# Patient Record
Sex: Female | Born: 1965 | Race: White | Hispanic: No | Marital: Married | Smoking: Never smoker
Health system: Southern US, Community
[De-identification: ages and names within clinical notes are randomized; demographics above are authoritative.]

## PROBLEM LIST (undated history)

## (undated) DIAGNOSIS — C801 Malignant (primary) neoplasm, unspecified: Secondary | ICD-10-CM

## (undated) DIAGNOSIS — R011 Cardiac murmur, unspecified: Secondary | ICD-10-CM

## (undated) DIAGNOSIS — T783XXA Angioneurotic edema, initial encounter: Secondary | ICD-10-CM

## (undated) DIAGNOSIS — L509 Urticaria, unspecified: Secondary | ICD-10-CM

## (undated) DIAGNOSIS — Z8489 Family history of other specified conditions: Secondary | ICD-10-CM

## (undated) DIAGNOSIS — R7303 Prediabetes: Secondary | ICD-10-CM

## (undated) HISTORY — DX: Angioneurotic edema, initial encounter: T78.3XXA

## (undated) HISTORY — DX: Urticaria, unspecified: L50.9

## (undated) HISTORY — PX: TONSILLECTOMY: SUR1361

---

## 2012-10-19 ENCOUNTER — Emergency Department (HOSPITAL_COMMUNITY): Payer: BC Managed Care – PPO

## 2012-10-19 ENCOUNTER — Inpatient Hospital Stay (HOSPITAL_COMMUNITY)
Admission: EM | Admit: 2012-10-19 | Discharge: 2012-10-22 | DRG: 421 | Disposition: A | Discharge status: Home / Self Care | Payer: BC Managed Care – PPO | Attending: Family Medicine | Admitting: Family Medicine

## 2012-10-19 ENCOUNTER — Encounter (HOSPITAL_COMMUNITY): Payer: Self-pay | Admitting: Emergency Medicine

## 2012-10-19 DIAGNOSIS — A938 Other specified arthropod-borne viral fevers: Principal | ICD-10-CM | POA: Diagnosis present

## 2012-10-19 DIAGNOSIS — Z8249 Family history of ischemic heart disease and other diseases of the circulatory system: Secondary | ICD-10-CM

## 2012-10-19 DIAGNOSIS — E860 Dehydration: Secondary | ICD-10-CM | POA: Diagnosis present

## 2012-10-19 DIAGNOSIS — E876 Hypokalemia: Secondary | ICD-10-CM | POA: Diagnosis present

## 2012-10-19 DIAGNOSIS — D72819 Decreased white blood cell count, unspecified: Secondary | ICD-10-CM | POA: Diagnosis present

## 2012-10-19 DIAGNOSIS — R7402 Elevation of levels of lactic acid dehydrogenase (LDH): Secondary | ICD-10-CM | POA: Diagnosis present

## 2012-10-19 DIAGNOSIS — D61818 Other pancytopenia: Secondary | ICD-10-CM | POA: Diagnosis present

## 2012-10-19 DIAGNOSIS — R000 Tachycardia, unspecified: Secondary | ICD-10-CM | POA: Diagnosis present

## 2012-10-19 DIAGNOSIS — K219 Gastro-esophageal reflux disease without esophagitis: Secondary | ICD-10-CM | POA: Diagnosis present

## 2012-10-19 DIAGNOSIS — R7401 Elevation of levels of liver transaminase levels: Secondary | ICD-10-CM | POA: Diagnosis present

## 2012-10-19 DIAGNOSIS — D696 Thrombocytopenia, unspecified: Secondary | ICD-10-CM

## 2012-10-19 DIAGNOSIS — I959 Hypotension, unspecified: Secondary | ICD-10-CM | POA: Diagnosis present

## 2012-10-19 DIAGNOSIS — B882 Other arthropod infestations: Secondary | ICD-10-CM

## 2012-10-19 DIAGNOSIS — A7740 Ehrlichiosis, unspecified: Secondary | ICD-10-CM | POA: Diagnosis present

## 2012-10-19 LAB — CBC WITH DIFFERENTIAL/PLATELET
Eosinophils Relative: 0 % (ref 0–5)
HCT: 36.2 % (ref 36.0–46.0)
Hemoglobin: 12.5 g/dL (ref 12.0–15.0)
Lymphocytes Relative: 19 % (ref 12–46)
Lymphs Abs: 0.3 10*3/uL — ABNORMAL LOW (ref 0.7–4.0)
MCV: 88.5 fL (ref 78.0–100.0)
Monocytes Relative: 6 % (ref 3–12)
Neutro Abs: 1.3 10*3/uL — ABNORMAL LOW (ref 1.7–7.7)
RBC: 4.09 MIL/uL (ref 3.87–5.11)
WBC: 1.7 10*3/uL — ABNORMAL LOW (ref 4.0–10.5)

## 2012-10-19 LAB — URINALYSIS, ROUTINE W REFLEX MICROSCOPIC
Hgb urine dipstick: NEGATIVE
Ketones, ur: 15 mg/dL — AB
Nitrite: NEGATIVE
Protein, ur: 100 mg/dL — AB
Specific Gravity, Urine: >1.03 — ABNORMAL HIGH (ref 1.005–1.030)
Urobilinogen, UA: 1 mg/dL (ref 0.0–1.0)

## 2012-10-19 LAB — COMPREHENSIVE METABOLIC PANEL
ALT: 95 U/L — ABNORMAL HIGH (ref 0–35)
AST: 78 U/L — ABNORMAL HIGH (ref 0–37)
Alkaline Phosphatase: 66 U/L (ref 39–117)
CO2: 30 mEq/L (ref 19–32)
Calcium: 8.9 mg/dL (ref 8.4–10.5)
GFR calc Af Amer: >90 mL/min (ref >90)
GFR calc non Af Amer: >90 mL/min (ref >90)
Glucose, Bld: 111 mg/dL — ABNORMAL HIGH (ref 70–99)
Potassium: 3.1 mEq/L — ABNORMAL LOW (ref 3.5–5.1)
Sodium: 139 mEq/L (ref 135–145)
Total Protein: 6.9 g/dL (ref 6.0–8.3)

## 2012-10-19 LAB — URINE MICROSCOPIC-ADD ON

## 2012-10-19 MED ORDER — ACETAMINOPHEN 325 MG PO TABS
650.0000 mg | ORAL_TABLET | Freq: Four times a day (QID) | ORAL | Status: DC | PRN
  Administered 2012-10-19 – 2012-10-21 (×6): 650 mg via ORAL
  Filled 2012-10-19 (×6): qty 2

## 2012-10-19 MED ORDER — DOXYCYCLINE HYCLATE 100 MG PO TABS
100.0000 mg | ORAL_TABLET | Freq: Two times a day (BID) | ORAL | Status: DC
  Administered 2012-10-20 – 2012-10-22 (×5): 100 mg via ORAL
  Filled 2012-10-19 (×5): qty 1

## 2012-10-19 MED ORDER — SODIUM CHLORIDE 0.9 % IV BOLUS (SEPSIS)
1000.0000 mL | Freq: Once | INTRAVENOUS | Status: AC
  Administered 2012-10-19: 1000 mL via INTRAVENOUS

## 2012-10-19 MED ORDER — POTASSIUM CHLORIDE CRYS ER 20 MEQ PO TBCR
40.0000 meq | EXTENDED_RELEASE_TABLET | ORAL | Status: AC
  Administered 2012-10-19 (×2): 40 meq via ORAL
  Filled 2012-10-19 (×2): qty 2

## 2012-10-19 MED ORDER — DOXYCYCLINE HYCLATE 100 MG PO TABS: 100.0000 mg | ORAL_TABLET | Freq: Two times a day (BID) | ORAL | Status: DC

## 2012-10-19 MED ORDER — HYDROCODONE-ACETAMINOPHEN 5-325 MG PO TABS
1.0000 | ORAL_TABLET | ORAL | Status: DC | PRN
  Administered 2012-10-20: 1 via ORAL
  Filled 2012-10-19: qty 1

## 2012-10-19 MED ORDER — ONDANSETRON HCL 4 MG/2ML IJ SOLN: 4.0000 mg | Freq: Three times a day (TID) | INTRAMUSCULAR | Status: DC | PRN

## 2012-10-19 MED ORDER — ONDANSETRON HCL 4 MG/2ML IJ SOLN
4.0000 mg | Freq: Once | INTRAMUSCULAR | Status: AC
  Administered 2012-10-19: 4 mg via INTRAVENOUS
  Filled 2012-10-19: qty 2

## 2012-10-19 MED ORDER — ACETAMINOPHEN 650 MG RE SUPP: 650.0000 mg | Freq: Four times a day (QID) | RECTAL | Status: DC | PRN

## 2012-10-19 MED ORDER — ONDANSETRON HCL 4 MG PO TABS
4.0000 mg | ORAL_TABLET | Freq: Four times a day (QID) | ORAL | Status: DC | PRN
  Administered 2012-10-19: 4 mg via ORAL
  Filled 2012-10-19: qty 1

## 2012-10-19 MED ORDER — HYDROMORPHONE HCL PF 1 MG/ML IJ SOLN: 0.5000 mg | INTRAMUSCULAR | Status: AC | PRN

## 2012-10-19 MED ORDER — SODIUM CHLORIDE 0.9 % IV SOLN
INTRAVENOUS | Status: DC
  Administered 2012-10-19: 13:00:00 via INTRAVENOUS

## 2012-10-19 MED ORDER — ONDANSETRON HCL 4 MG/2ML IJ SOLN
4.0000 mg | Freq: Four times a day (QID) | INTRAMUSCULAR | Status: DC | PRN
  Administered 2012-10-21: 4 mg via INTRAVENOUS
  Filled 2012-10-19: qty 2

## 2012-10-19 MED ORDER — DOXYCYCLINE HYCLATE 100 MG PO TABS
100.0000 mg | ORAL_TABLET | Freq: Once | ORAL | Status: AC
  Administered 2012-10-19: 100 mg via ORAL
  Filled 2012-10-19: qty 1

## 2012-10-19 MED ORDER — SODIUM CHLORIDE 0.9 % IV SOLN
INTRAVENOUS | Status: AC
  Administered 2012-10-19: 16:00:00 via INTRAVENOUS

## 2012-10-19 MED ORDER — ALUM & MAG HYDROXIDE-SIMETH 200-200-20 MG/5ML PO SUSP
30.0000 mL | Freq: Four times a day (QID) | ORAL | Status: DC | PRN
  Administered 2012-10-20 – 2012-10-21 (×2): 30 mL via ORAL
  Filled 2012-10-19 (×2): qty 30

## 2012-10-19 MED ORDER — HYDROMORPHONE HCL PF 1 MG/ML IJ SOLN: 1.0000 mg | INTRAMUSCULAR | Status: DC | PRN

## 2012-10-19 NOTE — H&P (Signed)
Patient seen, independently examined and chart reviewed. I agree with exam, assessment and plan discussed with Toya Smothers, NP.  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 Approximately 3-4 weeks ago she noticed a tick bite between her shoulder blades. She never developed a rash. Shortly thereafter she developed nausea without vomiting. She ended up going to the beach on vacation and developed some urinary incontinence, was treated with a sulfa antibiotic with some improvement. However she has continued to have vague nausea and fatigue. Her appetite has been poor. She has had fever constantly for the last several days which she has been treating with antipyretics and shaking chills intermittently at home. She continued to work. Earlier this week she was started on Augmentin and given a shot of Rocephin for possible recurrent bladder infection. Today she felt poorly, her blood pressure is checked at work and she was found to be hypotensive. She had a severe headache probably 3 weeks ago which resolved. She did have a mild headache earlier today.  She has some shaking chills but appears nontoxic. Skin exam is unremarkable. Cardiovascular regular rate and rhythm. Respiratory clear to auscultation bilaterally. Speech fluent and clear.  Her evaluation in the emergency department is highly suggestive of tick borne illness (leukopenia, thrombocytopenia, elevated serum transaminases). Given the lack of rash, suspect Ehrlichiosis, although cannot exclude [facility] Spotted Fever. Regardless treatment is doxycycline.   [name], MD [facility] [phone]

## 2012-10-19 NOTE — ED Notes (Signed)
Pt ambulated to BR without difficultly but pt still feels weak, pt back to bed

## 2012-10-19 NOTE — ED Provider Notes (Signed)
History  This chart was scribed for Shelda Jakes, MD by Bennett Scrape, ED Scribe. This patient was seen in room APA07/APA07 and the patient's care was started at 10:18 AM.  CSN: [id number]  Arrival date & time 10/19/12  4098   First MD Initiated Contact with Patient 10/19/12 1018     Chief Complaint  Patient presents with  . Nausea  . Fever  . Fatigue    Patient is a 47 y.o. female presenting with fever. The history is provided by the patient. No language interpreter was used.  Fever Max temp prior to arrival:  103 Severity:  Moderate Duration:  2 days Timing:  Intermittent Progression:  Unchanged Chronicity:  New Relieved by:  Acetaminophen and ibuprofen Worsened by:  Nothing tried Associated symptoms: headaches (with fever), myalgias (with fever) and nausea   Associated symptoms: no chest pain, no chills, no confusion, no congestion, no cough, no diarrhea, no dysuria, no rash, no sore throat and no vomiting     HPI Comments: Holly Allison is a 47 y.o. female who presents to the Emergency Department complaining of 2 days of nausea with associated fever of 103 and lightheadedness. The nausea is worse with standing and improved with rest. She reports that she has had a negative work up at her PCP's 2 days ago and has been treated with Augmentin with no improvement. The fever has been controlled with OTC medications since onset. She reports one episode of neat syncope this morning with a BP of 78/56 during the episode. She reports one episode of similar symptoms 2 weeks ago that improved with "sulfa antibiotics". She admits to having a tick bite one month ago but none within the past 2 weeks. She also reports myalgias and HAs as associated symptoms with the fever but denies emesis, diarrhea and abdominal pain. Pt does not have a h/o chronic medical conditions and denies smoking and alcohol use.  PCP is Dr. Neita Carp in [place].  History reviewed. No pertinent past medical  history.  Past Surgical History  Procedure Laterality Date  . Cesarean section    . Tonsillectomy     No family history on file. History  Substance Use Topics  . Smoking status: Never Smoker   . Smokeless tobacco: Not on file  . Alcohol Use: No   No OB history provided.  Review of Systems  Constitutional: Positive for fever. Negative for chills.  HENT: Negative for congestion, sore throat and neck pain.   Eyes: Negative for visual disturbance.  Respiratory: Negative for cough and shortness of breath.   Cardiovascular: Negative for chest pain and leg swelling.  Gastrointestinal: Positive for nausea. Negative for vomiting, abdominal pain and diarrhea.  Genitourinary: Negative for dysuria.  Musculoskeletal: Positive for myalgias (with fever). Negative for back pain.  Skin: Negative for rash.  Neurological: Positive for light-headedness and headaches (with fever).  Hematological: Does not bruise/bleed easily.  Psychiatric/Behavioral: Negative for confusion.    Allergies  Review of patient's allergies indicates no known allergies.  Home Medications   Current Outpatient Rx  Name  Route  Sig  Dispense  Refill  . acetaminophen (TYLENOL) 500 MG tablet   Oral   Take 500 mg by mouth every 6 (six) hours as needed for pain.         Marland Kitchen amoxicillin-clavulanate (AUGMENTIN) 875-125 MG per tablet   Oral   Take 1 tablet by mouth 2 (two) times daily. Started on Tuesday 10/17/2012         .  ibuprofen (ADVIL,MOTRIN) 200 MG tablet   Oral   Take 400 mg by mouth every 6 (six) hours as needed for pain.          Triage Vitals: BP 127/77  Pulse 102  Temp(Src) 98.9 F (37.2 C)  Resp 18  Ht 5\' 4"  (1.626 m)  Wt 152 lb (68.947 kg)  BMI 26.08 kg/m2  SpO2 97%  LMP 09/10/2012  Physical Exam  Nursing note and vitals reviewed. Constitutional: She is oriented to person, place, and time. She appears well-developed and well-nourished. No distress.  HENT:  Head: Normocephalic and  atraumatic.  Mouth/Throat: Oropharynx is clear and moist.  Eyes: Conjunctivae and EOM are normal. Pupils are equal, round, and reactive to light.  Sclera are clear  Neck: Neck supple. No tracheal deviation present.  Cardiovascular: Normal rate and regular rhythm.   No murmur heard. Pulses:      Dorsalis pedis pulses are 2+ on the right side, and 2+ on the left side.  Pulmonary/Chest: Effort normal and breath sounds normal. No respiratory distress. She has no wheezes.  Abdominal: Soft. Bowel sounds are normal. She exhibits no distension. There is no tenderness.  Musculoskeletal: Normal range of motion. She exhibits no edema (no ankle swelling).  Lymphadenopathy:    She has no cervical adenopathy.  Neurological: She is alert and oriented to person, place, and time. No cranial nerve deficit.  Pt able to move both sets of fingers and toes  Skin: Skin is warm and dry. No rash noted.  Psychiatric: She has a normal mood and affect. Her behavior is normal.    ED Course  Procedures (including critical care time)  DIAGNOSTIC STUDIES: Oxygen Saturation is 97% on room air, normal by my interpretation.    COORDINATION OF CARE: 11:04 AM-Discussed treatment plan which includes repeat BMP and CBC with pt at bedside and pt agreed to plan.   Labs Reviewed  URINALYSIS, ROUTINE W REFLEX MICROSCOPIC - Abnormal; Notable for the following:    Color, Urine AMBER (*)    Specific Gravity, Urine >1.030 (*)    Bilirubin Urine MODERATE (*)    Ketones, ur 15 (*)    Protein, ur 100 (*)    All other components within normal limits  URINE MICROSCOPIC-ADD ON - Abnormal; Notable for the following:    Squamous Epithelial / LPF MANY (*)    Bacteria, UA MANY (*)    All other components within normal limits  CBC WITH DIFFERENTIAL - Abnormal; Notable for the following:    WBC 1.7 (*)    Platelets 103 (*)    Neutro Abs 1.3 (*)    Lymphs Abs 0.3 (*)    All other components within normal limits  COMPREHENSIVE  METABOLIC PANEL - Abnormal; Notable for the following:    Potassium 3.1 (*)    Glucose, Bld 111 (*)    AST 78 (*)    ALT 95 (*)    All other components within normal limits  CULTURE, BLOOD (ROUTINE X 2)  CULTURE, BLOOD (ROUTINE X 2)  PREGNANCY, URINE  LIPASE, BLOOD  LACTIC ACID, PLASMA   Results for orders placed during the hospital encounter of 10/19/12  URINALYSIS, ROUTINE W REFLEX MICROSCOPIC      Result Value Range   Color, Urine AMBER (*) YELLOW   APPearance CLEAR  CLEAR   Specific Gravity, Urine >1.030 (*) 1.005 - 1.030   pH 5.5  5.0 - 8.0   Glucose, UA NEGATIVE  NEGATIVE mg/dL   Hgb urine  dipstick NEGATIVE  NEGATIVE   Bilirubin Urine MODERATE (*) NEGATIVE   Ketones, ur 15 (*) NEGATIVE mg/dL   Protein, ur 657 (*) NEGATIVE mg/dL   Urobilinogen, UA 1.0  0.0 - 1.0 mg/dL   Nitrite NEGATIVE  NEGATIVE   Leukocytes, UA NEGATIVE  NEGATIVE  PREGNANCY, URINE      Result Value Range   Preg Test, Ur NEGATIVE  NEGATIVE  URINE MICROSCOPIC-ADD ON      Result Value Range   Squamous Epithelial / LPF MANY (*) RARE   WBC, UA 0-2  <3 WBC/hpf   RBC / HPF 0-2  <3 RBC/hpf   Bacteria, UA MANY (*) RARE  CBC WITH DIFFERENTIAL      Result Value Range   WBC 1.7 (*) 4.0 - 10.5 K/uL   RBC 4.09  3.87 - 5.11 MIL/uL   Hemoglobin 12.5  12.0 - 15.0 g/dL   HCT 84.6  96.2 - 95.2 %   MCV 88.5  78.0 - 100.0 fL   MCH 30.6  26.0 - 34.0 pg   MCHC 34.5  30.0 - 36.0 g/dL   RDW 84.1  32.4 - 40.1 %   Platelets 103 (*) 150 - 400 K/uL   Neutrophils Relative % 74  43 - 77 %   Lymphocytes Relative 19  12 - 46 %   Monocytes Relative 6  3 - 12 %   Eosinophils Relative 0  0 - 5 %   Basophils Relative 1  0 - 1 %   Neutro Abs 1.3 (*) 1.7 - 7.7 K/uL   Lymphs Abs 0.3 (*) 0.7 - 4.0 K/uL   Monocytes Absolute 0.1  0.1 - 1.0 K/uL   Eosinophils Absolute 0.0  0.0 - 0.7 K/uL   Basophils Absolute 0.0  0.0 - 0.1 K/uL   RBC Morphology POLYCHROMASIA PRESENT     WBC Morphology WHITE COUNT CONFIRMED ON SMEAR     Smear  Review PLATELET COUNT CONFIRMED BY SMEAR    COMPREHENSIVE METABOLIC PANEL      Result Value Range   Sodium 139  135 - 145 mEq/L   Potassium 3.1 (*) 3.5 - 5.1 mEq/L   Chloride 102  96 - 112 mEq/L   CO2 30  19 - 32 mEq/L   Glucose, Bld 111 (*) 70 - 99 mg/dL   BUN 9  6 - 23 mg/dL   Creatinine, Ser 0.27  0.50 - 1.10 mg/dL   Calcium 8.9  8.4 - 25.3 mg/dL   Total Protein 6.9  6.0 - 8.3 g/dL   Albumin 3.5  3.5 - 5.2 g/dL   AST 78 (*) 0 - 37 U/L   ALT 95 (*) 0 - 35 U/L   Alkaline Phosphatase 66  39 - 117 U/L   Total Bilirubin 0.3  0.3 - 1.2 mg/dL   GFR calc non Af Amer >90  >90 mL/min   GFR calc Af Amer >90  >90 mL/min  LIPASE, BLOOD      Result Value Range   Lipase 32  11 - 59 U/L      Dg Chest 2 View  10/19/2012   *RADIOLOGY REPORT*  Clinical Data: Fatigue for 2 days.  Nausea and fever.  CHEST - 2 VIEW  Comparison: 08/20/2010.  Findings: No consolidation, edema, effusion, pneumothorax. Indistinct medial right hemidiaphragm appears similar to prior, likely related to a fat pad.  Normal heart size and mediastinal contours.  No acute osseous abnormality.  IMPRESSION: No evidence of active cardiopulmonary disease.  Original Report Authenticated By: Tiburcio Pea   US Abdomen Complete  10/19/2012   *RADIOLOGY REPORT*  Abdominal ultrasound  History:  Nausea and fever  Findings:  The gallbladder is visualized in multiple projections. There are no gallstones, gallbladder wall thickening, or pericholecystic fluid collection.  There is no intrahepatic, common hepatic, or common bile duct dilatation.  Pancreas appears normal.  No focal liver lesions are identified.  Flow in the portal vein is in the anatomic direction.  Spleen is upper limits normal in size with normal splenic echogenicity.  There is a prominent extrarenal pelvis on the right, an anatomic variant.  Kidneys bilaterally otherwise appear normal. There is no ascites.  Aorta is nonaneurysmal.  Inferior vena cava appears normal.   Conclusion:  Spleen upper normal in size.  No focal splenic lesions.  Prominent extrarenal pelvis right kidney, an anatomic variant.  Study otherwise unremarkable.   Original Report Authenticated By: Bretta Bang, M.D.    1. Leukopenia     MDM  Patient is ill-appearing. Based on CBC significant leukopenia and low platelets. Electrolytes without significant abnormalities but concern for possible tickborne illness. Patient did have a tick exposure about a month ago. Certainly no evidence of [facility] spotted fever at this point in time without a rash but she is febrile. Patient certainly has chills and is ill-appearing. Ultrasound shows slight enlargement of the spleen but nothing significant gallbladder was fine she had some mild right upper quadrant tenderness. Patient also has some liver function test abnormalities the bilirubin is not elevated. All this is suggestive of possible tickborne illness internal medicine raise to concern for regular doses which is not common in this area. But it is possibility of Lyme's disease is also a possibility. [facility] spotted fevers possibly if rash develops. We'll treat with doxycycline and arrange observation admission. Patient will benefit from continued hydration. A better on the blood cultures and lactic acid. Patient was concerned about urinary tract infection no evidence of that at all.  I personally performed the services described in this documentation, which was scribed in my presence. The recorded information has been reviewed and is accurate.   Shelda Jakes, MD 10/19/12 [phone]

## 2012-10-19 NOTE — H&P (Signed)
[facility] History and Physical  Maryana Pittmon YNW:[id number] DOB: 13-Apr-1965 DOA: 10/19/2012  Referring physician:  PCP: [name], MD  Specialists:   Chief Complaint: Fever and fatigue  HPI: Clarita Mcelvain is a 47 y.o. female no past medical history except gestational diabetes who presents to the emergency room today with the chief complaint of fever, fatigue and nausea. Information is obtained from the patient. She states that about 3 weeks ago she found a tick between her shoulder blades on her back. It was removed by her husband. States that shortly thereafter she went to the beach and began to feel generally "bad". She thought she had a urinary tract infection so she called her doctor who called some medication in for her at the beach. She states that she began to feel little bit better.She came home from the beach feeling somewhat better and return to work. Last week she gradually began to feel "bad all over again. She states that primary symptoms were intermittent fever with the max being 103, constant nausea no vomiting and worsening fatigue. She states that today she awakened feeling very weak and feverish but decided to get dressed and go to work. When she arrived at work she developed a headache and felt dizzy. Her coworkers took her blood pressure and at that time her blood pressure was 76/52. Her coworkers then called her mother who transported her to the emergency room. Patient denies any syncope. She denies any neck rigidity. Lab work in the emergency room is significant for a white count of 1.7 platelets of 103 potassium 3.1, AST 78 ALT 95. Abdominal ultrasound yields spleen upper normal in size. No focal splenic lesions. Prominent extrarenal pelvis right kidney, an anatomic variant. Chest x-ray yields no evidence of active cardiopulmonary disease. No signs are significant for a heart rate of 102. Sent received IV fluids and Zofran in the emergency room. Symptoms came on  gradually have persisted and worsened. Characterized as moderate. [facility] asked to admit.   Review of Systems: The patient denies  weight loss,, vision loss, decreased hearing, hoarseness, chest pain, syncope, dyspnea on exertion, peripheral edema, balance deficits, hemoptysis, abdominal pain, melena, hematochezia, severe indigestion/heartburn, hematuria, incontinence, genital sores,  suspicious skin lesions, transient blindness, difficulty walking, depression, unusual weight change, abnormal bleeding, enlarged lymph nodes, angioedema, and breast masses.    History reviewed. No pertinent past medical history. Past Surgical History  Procedure Laterality Date  . Cesarean section    . Tonsillectomy     Social History:  reports that she has never smoked. She does not have any smokeless tobacco history on file. She reports that she does not drink alcohol or use illicit drugs. 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 She is married lives with her husband and her children No Known Allergies  No family history on file. mother is alive at 34 years old. She has diabetes hypertension and high cholesterol. The patient has one brother who has hypertension. Her father's medical  history is unknown to her.  Prior to Admission medications   Medication Sig Start Date End Date Taking? Authorizing Provider  acetaminophen (TYLENOL) 500 MG tablet Take 500 mg by mouth every 6 (six) hours as needed for pain.   Yes Historical Provider, MD  amoxicillin-clavulanate (AUGMENTIN) 875-125 MG per tablet Take 1 tablet by mouth 2 (two) times daily. Started on Tuesday 10/17/2012   Yes Historical Provider, MD  ibuprofen (ADVIL,MOTRIN) 200 MG tablet Take 400 mg by mouth every  6 (six) hours as needed for pain.   Yes Historical Provider, MD   Physical Exam: Filed Vitals:   10/19/12 1000 10/19/12 1002 10/19/12 1004 10/19/12 1524  BP: 145/78 126/73 127/77 128/81  Pulse: 91 95 102 100  Temp:     98.6 F (37 C)  TempSrc:    Oral  Resp:    18  Height:      Weight:      SpO2:    99%     General:  Well-nourished somewhat tremulous somewhat ill appearing  Eyes: PE RRL, it EOMI, no scleral icterus  ENT: Ears clear nose without drainage oropharynx without erythema or exudate. Mucous membranes of her mouth are pink slightly dry  Neck: Supple full range of motion no rigidity no lymphadenopathy  Cardiovascular: Mildly tachycardic but regular no murmur gallop or rub no lower extremity edema pedal pulses present and palpable  Respiratory: Normal effort breath sounds clear bilaterally no wheeze no rhonchi  Abdomen: Soft nondistended positive bowel sounds nontender to palpation no mass organomegaly noted  Skin: Warm and dry no rash no lesions  Musculoskeletal: No clubbing no cyanosis joints without erythema or swelling. Joints nontender to touch  Psychiatric: Somewhat anxious and teary but cooperative  Neurologic: Cranial nerves II through XII grossly intact speech clear facial some  Labs on Admission:  Basic Metabolic Panel:  Recent Labs Lab 10/19/12 1143  NA 139  K 3.1*  CL 102  CO2 30  GLUCOSE 111*  BUN 9  CREATININE 0.69  CALCIUM 8.9   Liver Function Tests:  Recent Labs Lab 10/19/12 1143  AST 78*  ALT 95*  ALKPHOS 66  BILITOT 0.3  PROT 6.9  ALBUMIN 3.5    Recent Labs Lab 10/19/12 1143  LIPASE 32   No results found for this basename: AMMONIA,  in the last 168 hours CBC:  Recent Labs Lab 10/19/12 1143  WBC 1.7*  NEUTROABS 1.3*  HGB 12.5  HCT 36.2  MCV 88.5  PLT 103*   Cardiac Enzymes: No results found for this basename: CKTOTAL, CKMB, CKMBINDEX, TROPONINI,  in the last 168 hours  BNP (last 3 results) No results found for this basename: PROBNP,  in the last 8760 hours CBG: No results found for this basename: GLUCAP,  in the last 168 hours  Radiological Exams on Admission: Dg Chest 2 View  10/19/2012   *RADIOLOGY REPORT*  Clinical  Data: Fatigue for 2 days.  Nausea and fever.  CHEST - 2 VIEW  Comparison: 08/20/2010.  Findings: No consolidation, edema, effusion, pneumothorax. Indistinct medial right hemidiaphragm appears similar to prior, likely related to a fat pad.  Normal heart size and mediastinal contours.  No acute osseous abnormality.  IMPRESSION: No evidence of active cardiopulmonary disease.   Original Report Authenticated By: Tiburcio Pea   US Abdomen Complete  10/19/2012   *RADIOLOGY REPORT*  Abdominal ultrasound  History:  Nausea and fever  Findings:  The gallbladder is visualized in multiple projections. There are no gallstones, gallbladder wall thickening, or pericholecystic fluid collection.  There is no intrahepatic, common hepatic, or common bile duct dilatation.  Pancreas appears normal.  No focal liver lesions are identified.  Flow in the portal vein is in the anatomic direction.  Spleen is upper limits normal in size with normal splenic echogenicity.  There is a prominent extrarenal pelvis on the right, an anatomic variant.  Kidneys bilaterally otherwise appear normal. There is no ascites.  Aorta is nonaneurysmal.  Inferior vena cava appears normal.  Conclusion:  Spleen upper normal in size.  No focal splenic lesions.  Prominent extrarenal pelvis right kidney, an anatomic variant.  Study otherwise unremarkable.   Original Report Authenticated By: Bretta Bang, M.D.    EKG:   Assessment/Plan Active Problems:   Leukopenia: Etiology unclear. Concern for tickborne illness given reported history. Admit to medical floor for observation. Will start doxycycline 100 mg by mouth twice a day.    Thrombocytopenia: Etiology unclear but there is a concern for tickborne illness given reported history. No current signs and symptoms of bleeding. Will provide supportive therapy. Will use SCDs for DVT prophylaxis. Recheck in the a.m.    Elevated transaminase level: See #1 and #2. We'll provide supportive therapy. Bilirubin  within the limits of normal. Will recheck in the a.m.    Tachycardia: Related to fever. Will provide IV fluids. Tylenol for any fever. Will check vital signs every 4x3.    Hypokalemia: Isidor Holts related to decreased by mouth intake do to general malaise, anorexia and persistent nausea. We'll provide supportive therapy. Will replete. Will recheck in the a.m.  Fever: See #1 and #2. Will monitor vital signs every 4x3. Will provide Tylenol for any spike in temperature. Blood cultures have been drawn and results are pending. Lactic acid is pending as well. At time of my exam patient is afebrile somewhat ill appearing but nontoxic. She is hemodynamically stable.     Code Status: Full Family Communication: Mother at bedside Disposition Plan: Home when ready likely 24-48 hour  Time spent: 82 minute  Gwenyth Bender [facility] Pager [phone]  If 7PM-7AM, please contact night-coverage [email or web address] Password [facility] 10/19/2012, 4:39 PM

## 2012-10-19 NOTE — ED Notes (Signed)
Pt c/o n/fever/fatigue since monday with low bp 78/56 this am. bp 129/72 at present. nad noted.

## 2012-10-19 NOTE — ED Notes (Signed)
MD at bedside. 

## 2012-10-20 DIAGNOSIS — B882 Other arthropod infestations: Secondary | ICD-10-CM

## 2012-10-20 DIAGNOSIS — E860 Dehydration: Secondary | ICD-10-CM | POA: Diagnosis present

## 2012-10-20 DIAGNOSIS — A94 Unspecified arthropod-borne viral fever: Secondary | ICD-10-CM

## 2012-10-20 DIAGNOSIS — I959 Hypotension, unspecified: Secondary | ICD-10-CM | POA: Diagnosis present

## 2012-10-20 LAB — COMPREHENSIVE METABOLIC PANEL
ALT: 86 U/L — ABNORMAL HIGH (ref 0–35)
AST: 80 U/L — ABNORMAL HIGH (ref 0–37)
Calcium: 7.7 mg/dL — ABNORMAL LOW (ref 8.4–10.5)
Creatinine, Ser: 0.66 mg/dL (ref 0.50–1.10)
GFR calc Af Amer: >90 mL/min (ref >90)
Sodium: 137 mEq/L (ref 135–145)
Total Protein: 5.6 g/dL — ABNORMAL LOW (ref 6.0–8.3)

## 2012-10-20 LAB — CBC
HCT: 30.5 % — ABNORMAL LOW (ref 36.0–46.0)
Hemoglobin: 10.5 g/dL — ABNORMAL LOW (ref 12.0–15.0)
MCH: 30.9 pg (ref 26.0–34.0)
MCHC: 34.4 g/dL (ref 30.0–36.0)
RBC: 3.4 MIL/uL — ABNORMAL LOW (ref 3.87–5.11)

## 2012-10-20 MED ORDER — SODIUM CHLORIDE 0.9 % IV SOLN: INTRAVENOUS | Status: AC

## 2012-10-20 MED ORDER — SODIUM CHLORIDE 0.9 % IV SOLN
INTRAVENOUS | Status: DC
  Administered 2012-10-20: 23:00:00 via INTRAVENOUS
  Filled 2012-10-20 (×7): qty 1000

## 2012-10-20 NOTE — Progress Notes (Signed)
Utilization Review Complete  

## 2012-10-20 NOTE — Progress Notes (Signed)
Patient seen, independently examined and chart reviewed. I agree with exam, assessment and plan discussed with Toya Smothers, NP.  Overall she feels a lot better today. Nausea has resolved and she would like to advance her diet. No chills since last night. Her chemistry panel remains unremarkable, serum transaminases without significant change. Leukopenia in terms of pain somewhat worse today. Hemoglobin decreased, likely reflective of dilution. Blood cultures are pending, no growth today.  Discussed with husband, daughter and patient at bedside, reviewed all laboratory studies with him. Clinical impression remains the same: Tickborne illness. She feels much better today and although laboratory studies are somewhat worse, suspect will improve over the next few days. Continue empiric doxycycline. Blood pressure slightly low but no evidence to suggest sepsis and clinically the patient appears much improved today. If her laboratory studies improve would anticipate discharge within the next 48 hours.  [name], MD [facility] [phone]

## 2012-10-20 NOTE — Progress Notes (Signed)
[facility] PROGRESS NOTE  Roy Snuffer ZOX:[id number] DOB: 01-28-1966 DOA: 10/19/2012 PCP: [name], MD  Assessment/Plan: Leukopenia: related to presumed tick borne illness. White count trending down slightly. Of note pt has had only 2 doses doxy and clinically she feels better. No rashes and afebrile for last 16 hours. Continue doxycycline 100 mg by mouth twice a day. monitor  Thrombocytopenia: See #1. Trending down still.  No current signs and symptoms of bleeding. Will provide supportive therapy. Will use SCDs for DVT prophylaxis. Recheck in the a.m.   Elevated transaminase level: See #1 and #2. ALT trending down and AST trending up slightly. Continue supportive therapy. Bilirubin within the limits of normal.    Tachycardia: Related to fever.resolved.  Will continue IV fluids. Tylenol for any fever.   Hypokalemia: Likely related to decreased by mouth intake do to general malaise, anorexia and persistent nausea. Resolve but on low end of normal. Pt nausea much improved and requesting food so I anticipate potassium to remain within the limits of normal.    Fever: See #1 and #2. Max temp 99.8 orally. Will provide Tylenol for any spike in temperature. Blood cultures have been drawn and results are pending. Lactic acid  1.3 yesterday. BP somewhat soft, likely related to dehydration. Will resume IV fluids. Clinically improved.   Hypotension: SBP dropped 79 during the night. Likely related to dehydration for decreased po intake and acute illness. IV fluids resumed. Will monitor closely.       Code Status: full Family Communication: husband at bedside Disposition Plan: home when ready   Consultants:  none  Procedures:  none  Antibiotics: Doxycyline 10/19/12>> HPI/Subjective: Alert reports "feeling a lot better". Complains of some weakness but no nausea no pain  Objective: Filed Vitals:   10/19/12 1811 10/19/12 2130 10/20/12 0225 10/20/12 0555  BP: 101/65 92/59  79/46 92/60  Pulse: 103 100 95 88  Temp: 102.5 F (39.2 C) 99.2 F (37.3 C) 99.8 F (37.7 C) 99.7 F (37.6 C)  TempSrc: Oral Oral Oral Oral  Resp:  18 18 18   Height: 5\' 4"  (1.626 m)     Weight: 74.39 kg (164 lb)     SpO2: 99% 96% 99% 98%    Intake/Output Summary (Last 24 hours) at 10/20/12 1033 Last data filed at 10/20/12 0205  Gross per 24 hour  Intake   3000 ml  Output   2050 ml  Net    950 ml   Filed Weights   10/19/12 0956 10/19/12 1809 10/19/12 1811  Weight: 68.947 kg (152 lb) 74.345 kg (163 lb 14.4 oz) 74.39 kg (164 lb)    Exam:   General:  Alert slightly pale well nourished NAD  Cardiovascular: RRR No MGR No LE edema  Respiratory: normal effort BS clear bilaterally to auscultation. No wheeze no rhonchi  Abdomen: soft +BS non-tender to palpation no mass  Musculoskeletal: no clubbing no cyanosis   Data Reviewed: Basic Metabolic Panel:  Recent Labs Lab 10/19/12 1143 10/20/12 0608  NA 139 137  K 3.1* 3.6  CL 102 106  CO2 30 27  GLUCOSE 111* 95  BUN 9 4*  CREATININE 0.69 0.66  CALCIUM 8.9 7.7*   Liver Function Tests:  Recent Labs Lab 10/19/12 1143 10/20/12 0608  AST 78* 80*  ALT 95* 86*  ALKPHOS 66 70  BILITOT 0.3 0.2*  PROT 6.9 5.6*  ALBUMIN 3.5 2.8*    Recent Labs Lab 10/19/12 1143  LIPASE 32   No results found for this basename:  AMMONIA,  in the last 168 hours CBC:  Recent Labs Lab 10/19/12 1143 10/20/12 0608  WBC 1.7* 1.2*  NEUTROABS 1.3*  --   HGB 12.5 10.5*  HCT 36.2 30.5*  MCV 88.5 89.7  PLT 103* 66*   Cardiac Enzymes: No results found for this basename: CKTOTAL, CKMB, CKMBINDEX, TROPONINI,  in the last 168 hours BNP (last 3 results) No results found for this basename: PROBNP,  in the last 8760 hours CBG: No results found for this basename: GLUCAP,  in the last 168 hours  No results found for this or any previous visit (from the past 240 hour(s)).   Studies: Dg Chest 2 View  10/19/2012   *RADIOLOGY REPORT*   Clinical Data: Fatigue for 2 days.  Nausea and fever.  CHEST - 2 VIEW  Comparison: 08/20/2010.  Findings: No consolidation, edema, effusion, pneumothorax. Indistinct medial right hemidiaphragm appears similar to prior, likely related to a fat pad.  Normal heart size and mediastinal contours.  No acute osseous abnormality.  IMPRESSION: No evidence of active cardiopulmonary disease.   Original Report Authenticated By: Tiburcio Pea   US Abdomen Complete  10/19/2012   *RADIOLOGY REPORT*  Abdominal ultrasound  History:  Nausea and fever  Findings:  The gallbladder is visualized in multiple projections. There are no gallstones, gallbladder wall thickening, or pericholecystic fluid collection.  There is no intrahepatic, common hepatic, or common bile duct dilatation.  Pancreas appears normal.  No focal liver lesions are identified.  Flow in the portal vein is in the anatomic direction.  Spleen is upper limits normal in size with normal splenic echogenicity.  There is a prominent extrarenal pelvis on the right, an anatomic variant.  Kidneys bilaterally otherwise appear normal. There is no ascites.  Aorta is nonaneurysmal.  Inferior vena cava appears normal.  Conclusion:  Spleen upper normal in size.  No focal splenic lesions.  Prominent extrarenal pelvis right kidney, an anatomic variant.  Study otherwise unremarkable.   Original Report Authenticated By: Bretta Bang, M.D.    Scheduled Meds: . sodium chloride   Intravenous STAT  . doxycycline  100 mg Oral Q12H   Continuous Infusions:   Active Problems:   Leukopenia   Thrombocytopenia   Elevated transaminase level   Tachycardia   Hypokalemia   Hypotension, unspecified   Dehydration   Time spent: 30 minutes  [facility] M  [facility] Pager [phone]. If 7PM-7AM, please contact night-coverage at [email or web address], password [facility] 10/20/2012, 10:33 AM  LOS: 1 day

## 2012-10-21 DIAGNOSIS — D61818 Other pancytopenia: Secondary | ICD-10-CM

## 2012-10-21 DIAGNOSIS — K219 Gastro-esophageal reflux disease without esophagitis: Secondary | ICD-10-CM

## 2012-10-21 LAB — CBC
HCT: 29.9 % — ABNORMAL LOW (ref 36.0–46.0)
MCH: 30.9 pg (ref 26.0–34.0)
MCV: 88.7 fL (ref 78.0–100.0)
Platelets: 81 10*3/uL — ABNORMAL LOW (ref 150–400)
RBC: 3.37 MIL/uL — ABNORMAL LOW (ref 3.87–5.11)
WBC: 3 10*3/uL — ABNORMAL LOW (ref 4.0–10.5)

## 2012-10-21 LAB — COMPREHENSIVE METABOLIC PANEL
AST: 289 U/L — ABNORMAL HIGH (ref 0–37)
BUN: 4 mg/dL — ABNORMAL LOW (ref 6–23)
CO2: 28 mEq/L (ref 19–32)
Calcium: 8.3 mg/dL — ABNORMAL LOW (ref 8.4–10.5)
Chloride: 105 mEq/L (ref 96–112)
Creatinine, Ser: 0.56 mg/dL (ref 0.50–1.10)
GFR calc non Af Amer: >90 mL/min (ref >90)
Total Bilirubin: 0.2 mg/dL — ABNORMAL LOW (ref 0.3–1.2)

## 2012-10-21 MED ORDER — POTASSIUM CHLORIDE IN NACL 20-0.9 MEQ/L-% IV SOLN
INTRAVENOUS | Status: DC
  Administered 2012-10-21: 08:00:00 via INTRAVENOUS

## 2012-10-21 MED ORDER — DOCUSATE SODIUM 100 MG PO CAPS
100.0000 mg | ORAL_CAPSULE | Freq: Two times a day (BID) | ORAL | Status: DC
  Administered 2012-10-21 – 2012-10-22 (×3): 100 mg via ORAL
  Filled 2012-10-21 (×3): qty 1

## 2012-10-21 MED ORDER — POLYETHYLENE GLYCOL 3350 17 G PO PACK
17.0000 g | PACK | Freq: Every day | ORAL | Status: DC
  Administered 2012-10-21 – 2012-10-22 (×2): 17 g via ORAL
  Filled 2012-10-21 (×2): qty 1

## 2012-10-21 MED ORDER — FAMOTIDINE 20 MG PO TABS
20.0000 mg | ORAL_TABLET | Freq: Two times a day (BID) | ORAL | Status: DC
  Administered 2012-10-21 – 2012-10-22 (×3): 20 mg via ORAL
  Filled 2012-10-21 (×3): qty 1

## 2012-10-21 MED ORDER — CALCIUM CARBONATE ANTACID 500 MG PO CHEW
1.0000 | CHEWABLE_TABLET | Freq: Three times a day (TID) | ORAL | Status: DC
  Administered 2012-10-21 – 2012-10-22 (×3): 200 mg via ORAL
  Filled 2012-10-21 (×3): qty 1

## 2012-10-21 NOTE — Progress Notes (Signed)
[facility] PROGRESS NOTE  Holly Allison:[id number] DOB: 1966-02-15 DOA: 10/19/2012 PCP: [name], MD  Assessment/Plan: 1. Presumed tickborne illness: Likely Ehrlichiosis given lack of rash. Fever has resolved and pancytopenia stabilizing or improving. Serum transaminases somewhat elevated. Monitor clinically. Continue doxycycline. 2. Pancytopenia: Platelets and white blood cell count 3. Hemoglobin stable. Treatment as above. 3. Elevated transaminases: AST and ALT somewhat increased today. Asymptomatic. Abdominal ultrasound was unremarkable. Presumed secondary to tickborne illness. Monitor clinically. 4. Severe GERD: Possibly related to antibiotic therapy. However, husband notes that this is been going on for last couple he. Patient is taking quite a bit of ibuprofen in the last couple of weeks for fever treatment. No bleeding. Start treatment for suspected gastritis.   Continue doxycycline.  Repeat CBC and complete metabolic panel in the morning.  Start empiric Pepcid, TUMS.  Likely on 7/13  Reviewed all laboratory studies and current treatment plan with patient, husband and daughters at bedside.  Code Status: Full code DVT prophylaxis: SCDs Family Communication: As above Disposition Plan: As above  Holly Sacks, MD  [facility]  Pager [phone] If 7PM-7AM, please contact night-coverage at [email or web address], password [facility] 10/21/2012, 12:48 PM  LOS: 2 days   Clinical Summary: 47 year old woman presented with several week history of malaise, nausea. Initial evaluation was notable for fever, thrombocytopenia, leukopenia and elevated transaminases. She was admitted for further evaluation and started on doxycycline empirically for suspected tickborne illness.  Consultants:  None  Procedures:  None  Antibiotics:  Doxycycline 7/11 >>  HPI/Subjective: Complains of severe heartburn. No belching. No abdominal pain. No recurrent fever. Otherwise she feels  quite well. Appetite however is poor.  Objective: Filed Vitals:   10/20/12 1300 10/20/12 2128 10/20/12 2258 10/21/12 0622  BP: 98/60 101/62  109/75  Pulse: 83 78  82  Temp: 98.5 F (36.9 C) 99 F (37.2 C) 99.5 F (37.5 C) 99.3 F (37.4 C)  TempSrc: Oral Oral  Oral  Resp: 18 18  18   Height:      Weight:      SpO2: 99% 100%  98%    Intake/Output Summary (Last 24 hours) at 10/21/12 1248 Last data filed at 10/21/12 0504  Gross per 24 hour  Intake 1178.33 ml  Output    800 ml  Net 378.33 ml     Filed Weights   10/19/12 0956 10/19/12 1809 10/19/12 1811  Weight: 68.947 kg (152 lb) 74.345 kg (163 lb 14.4 oz) 74.39 kg (164 lb)    Exam:   Afebrile, vital signs stable.  General: Appears calm and comfortable.  Cardiovascular: Regular rate and rhythm. No murmur, rub, gallop.  Respiratory: Clear to auscultation bilaterally. No wheezes, rales, rhonchi. Normal respiratory effort.  Abdomen: Soft, benign.  Psychiatric: Grossly normal mood and affect. Speech fluent and appropriate.  Data Reviewed:  Chemistry panel unremarkable, potassium 3.3. AST and ALT somewhat elevated.  Leukopenia, thrombocytopenia improved. Hemoglobin stable.  Pending studies:   Blood cultures  Scheduled Meds: . calcium carbonate  1 tablet Oral TID WC  . docusate sodium  100 mg Oral BID  . doxycycline  100 mg Oral Q12H  . famotidine  20 mg Oral BID  . polyethylene glycol  17 g Oral Daily   Continuous Infusions:   Principal Problem:   Tick-borne disease Active Problems:   Leukopenia   Thrombocytopenia   Elevated transaminase level   Tachycardia   Hypokalemia   Hypotension, unspecified   Dehydration   GERD (gastroesophageal reflux disease)   Other pancytopenia  Time spent 15 minutes

## 2012-10-22 LAB — COMPREHENSIVE METABOLIC PANEL
Alkaline Phosphatase: 165 U/L — ABNORMAL HIGH (ref 39–117)
BUN: 5 mg/dL — ABNORMAL LOW (ref 6–23)
CO2: 32 mEq/L (ref 19–32)
Chloride: 104 mEq/L (ref 96–112)
GFR calc Af Amer: >90 mL/min (ref >90)
GFR calc non Af Amer: >90 mL/min (ref >90)
Glucose, Bld: 93 mg/dL (ref 70–99)
Potassium: 3.7 mEq/L (ref 3.5–5.1)
Total Bilirubin: 0.3 mg/dL (ref 0.3–1.2)

## 2012-10-22 LAB — CBC
MCH: 30.8 pg (ref 26.0–34.0)
MCV: 88.6 fL (ref 78.0–100.0)
Platelets: 129 10*3/uL — ABNORMAL LOW (ref 150–400)
RBC: 3.51 MIL/uL — ABNORMAL LOW (ref 3.87–5.11)
RDW: 12.8 % (ref 11.5–15.5)

## 2012-10-22 LAB — PROTIME-INR: Prothrombin Time: 13.3 seconds (ref 11.6–15.2)

## 2012-10-22 MED ORDER — CALCIUM CARBONATE ANTACID 500 MG PO CHEW: 1.0000 | CHEWABLE_TABLET | Freq: Three times a day (TID) | ORAL | Status: DC

## 2012-10-22 MED ORDER — DOXYCYCLINE HYCLATE 100 MG PO TABS: 100.0000 mg | ORAL_TABLET | Freq: Two times a day (BID) | ORAL | Status: DC

## 2012-10-22 MED ORDER — FAMOTIDINE 20 MG PO TABS: 20.0000 mg | ORAL_TABLET | Freq: Two times a day (BID) | ORAL | Status: DC

## 2012-10-22 NOTE — Progress Notes (Signed)
Pt is to be discharged home today. Pt is in NAD, IV is out, all paperwork has been reviewed/discussed with patient, and there are no questions/concerns at this time. Assessment is unchanged from this morning. Pt is to be accompanied downstairs by staff and family via wheelchair.  

## 2012-10-22 NOTE — Progress Notes (Signed)
[facility] PROGRESS NOTE  Hawley Pavia ZOX:[id number] DOB: Dec 03, 1965 DOA: 10/19/2012 PCP: [name], MD  Assessment/Plan: 1. Presumed tickborne illness: Much improved. Likely Ehrlichiosis given lack of rash. Fever has resolved and pancytopenia stabilizing or improving. Continue doxycycline. 2. Pancytopenia: Leukopenia resolved, anemia stable, thrombocytopenia resolving. 3. Elevated transaminases: Now trending downward. Asymptomatic. Abdominal ultrasound was unremarkable. Presumed secondary to tickborne illness. 4. Severe GERD: Symptomatically resolved. Continue Pepcid, TUMS. Possibly secondary to antibiotics complicated by heavy NSAID use for the last several weeks. No signs or symptoms to suggest bleeding or peptic ulcer disease.   Continue doxycycline.  Home today  Code Status: Full code DVT prophylaxis: SCDs Family Communication: As above Disposition Plan: As above  [name], MD  [facility]  Pager [phone] If 7PM-7AM, please contact night-coverage at [email or web address], password [facility] 10/22/2012, 10:21 AM  LOS: 3 days   Clinical Summary: 47 year old woman presented with several week history of malaise, nausea. Initial evaluation was notable for fever, thrombocytopenia, leukopenia and elevated transaminases. She was admitted for further evaluation and started on doxycycline empirically for suspected tickborne illness.  Consultants:  None  Procedures:  None  Antibiotics:  Doxycycline 7/11 >> 7/20  HPI/Subjective: Feels much better. Heartburn resolved. No complaints. Ready to go home.  Objective: Filed Vitals:   10/21/12 0622 10/21/12 1407 10/21/12 2206 10/22/12 0613  BP: 109/75 113/78 113/78 92/64  Pulse: 82 100 86 95  Temp: 99.3 F (37.4 C) 100.2 F (37.9 C) 99.8 F (37.7 C) 99.4 F (37.4 C)  TempSrc: Oral Oral Oral Oral  Resp: 18 18 18 18   Height:      Weight:      SpO2: 98% 97% 96% 95%    Intake/Output Summary (Last 24 hours) at  10/22/12 1021 Last data filed at 10/21/12 1800  Gross per 24 hour  Intake    480 ml  Output      0 ml  Net    480 ml     Filed Weights   10/19/12 0956 10/19/12 1809 10/19/12 1811  Weight: 68.947 kg (152 lb) 74.345 kg (163 lb 14.4 oz) 74.39 kg (164 lb)    Exam:   Afebrile, vital signs stable.  General: Appears well.  Cardiovascular: Regular rate and rhythm. No murmur, rub, gallop. No lower extremity edema.  Respiratory: Clear to auscultation bilaterally. No wheezes, rales, rhonchi. Normal respiratory effort.  Data Reviewed:  Serum transaminases trending downwards. Chemistry panel otherwise unremarkable.  Leukopenia has resolved, thrombocytopenia much improved. Anemia stable. INR, PTT normal.  Pending studies:   Blood cultures no growth to date.  Scheduled Meds: . calcium carbonate  1 tablet Oral TID WC  . docusate sodium  100 mg Oral BID  . doxycycline  100 mg Oral Q12H  . famotidine  20 mg Oral BID  . polyethylene glycol  17 g Oral Daily   Continuous Infusions:   Principal Problem:   Tick-borne disease Active Problems:   Leukopenia   Thrombocytopenia   Elevated transaminase level   Tachycardia   Hypokalemia   Hypotension, unspecified   Dehydration   GERD (gastroesophageal reflux disease)   Other pancytopenia

## 2012-10-22 NOTE — Discharge Summary (Signed)
Physician Discharge Summary  Holly Allison ZOX:[id number] DOB: Jun 06, 1965 DOA: 10/19/2012  PCP: [name], MD  Admit date: 10/19/2012 Discharge date: 10/22/2012  Recommendations for Outpatient Follow-up:  1. Followup resolution of pancytopenia, elevated serum transaminases. 2. Followup resolution of severe GERD. The patient and her husband were instructed to take doxycycline at least one hour before taking TUMS or wait at least 4 hours after taking TUMS.  Follow-up Information   Follow up with [name], MD In 2 weeks.   Contact information:   [address] [place] [address] [phone]      Discharge Diagnoses:  1. Presumed tickborne illness, likely Ehrlichiosis 2.  pancytopenia 3. Elevated serum transaminases 4. Severe GERD  Discharge Condition: Improved Disposition: Home  Diet recommendation: Regular  Filed Weights   10/19/12 0956 10/19/12 1809 10/19/12 1811  Weight: 68.947 kg (152 lb) 74.345 kg (163 lb 14.4 oz) 74.39 kg (164 lb)    History of present illness:  47 year old woman presented with several week history of malaise, nausea. Initial evaluation was notable for fever, thrombocytopenia, leukopenia and elevated transaminases.  Hospital Course:  Tickborne illness was presumed the patient was started empirically on doxycycline. She slowly improved, now with resolution of fever and improving pancytopenia and serum transaminases. Her hospitalization was uncomplicated and she is now stable for discharge. Individual issues as below.  1. Presumed tickborne illness: Much improved. Likely Ehrlichiosis given lack of rash. Fever has resolved and pancytopenia stabilizing or improving. Continue doxycycline. 2. Pancytopenia: Leukopenia resolved, anemia stable, thrombocytopenia resolving. 3. Elevated transaminases: Now trending downward. Asymptomatic. Abdominal ultrasound was unremarkable. Presumed secondary to tickborne illness. 4. Severe GERD: Symptomatically resolved.  Continue Pepcid, TUMS. Possibly secondary to antibiotics complicated by heavy NSAID use for the last several weeks. No signs or symptoms to suggest bleeding or peptic ulcer disease.   Discharge Instructions  Discharge Orders   Future Orders Complete By Expires     Activity as tolerated - No restrictions  As directed     Diet general  As directed     Discharge instructions  As directed     Comments:      Be sure to finish doxycycline. Avoid NSAIDs including Advil, Aleve, ibuprofen, Motrin, BC powders, Goody powders. Call your physician or seek immediate medical attention for worsening of condition, recurrent fever.        Medication List    STOP taking these medications       amoxicillin-clavulanate 875-125 MG per tablet  Commonly known as:  AUGMENTIN     ibuprofen 200 MG tablet  Commonly known as:  ADVIL,MOTRIN      TAKE these medications       acetaminophen 500 MG tablet  Commonly known as:  TYLENOL  Take 500 mg by mouth every 6 (six) hours as needed for pain.     calcium carbonate 500 MG chewable tablet  Commonly known as:  TUMS - dosed in mg elemental calcium  Chew 1 tablet (200 mg of elemental calcium total) by mouth 3 (three) times daily with meals.     doxycycline 100 MG tablet  Commonly known as:  VIBRA-TABS  Take 1 tablet (100 mg total) by mouth every 12 (twelve) hours.     famotidine 20 MG tablet  Commonly known as:  PEPCID  Take 1 tablet (20 mg total) by mouth 2 (two) times daily.       No Known Allergies  The results of significant diagnostics from this hospitalization (including imaging, microbiology, ancillary and laboratory)  are listed below for reference.    Significant Diagnostic Studies: Dg Chest 2 View  10/19/2012   *RADIOLOGY REPORT*  Clinical Data: Fatigue for 2 days.  Nausea and fever.  CHEST - 2 VIEW  Comparison: 08/20/2010.  Findings: No consolidation, edema, effusion, pneumothorax. Indistinct medial right hemidiaphragm appears similar to  prior, likely related to a fat pad.  Normal heart size and mediastinal contours.  No acute osseous abnormality.  IMPRESSION: No evidence of active cardiopulmonary disease.   Original Report Authenticated By: Tiburcio Pea   US Abdomen Complete  10/19/2012   *RADIOLOGY REPORT*  Abdominal ultrasound  History:  Nausea and fever  Findings:  The gallbladder is visualized in multiple projections. There are no gallstones, gallbladder wall thickening, or pericholecystic fluid collection.  There is no intrahepatic, common hepatic, or common bile duct dilatation.  Pancreas appears normal.  No focal liver lesions are identified.  Flow in the portal vein is in the anatomic direction.  Spleen is upper limits normal in size with normal splenic echogenicity.  There is a prominent extrarenal pelvis on the right, an anatomic variant.  Kidneys bilaterally otherwise appear normal. There is no ascites.  Aorta is nonaneurysmal.  Inferior vena cava appears normal.  Conclusion:  Spleen upper normal in size.  No focal splenic lesions.  Prominent extrarenal pelvis right kidney, an anatomic variant.  Study otherwise unremarkable.   Original Report Authenticated By: Bretta Bang, M.D.    Microbiology: Recent Results (from the past 240 hour(s))  CULTURE, BLOOD (ROUTINE X 2)     Status: None   Collection Time    10/19/12  3:59 PM      Result Value Range Status   Specimen Description BLOOD RIGHT ARM   Final   Special Requests     Final   Value: BOTTLES DRAWN AEROBIC AND ANAEROBIC AEB=8CC ANA=6CC   Culture NO GROWTH 2 DAYS   Final   Report Status PENDING   Incomplete  CULTURE, BLOOD (ROUTINE X 2)     Status: None   Collection Time    10/19/12  4:05 PM      Result Value Range Status   Specimen Description BLOOD LEFT ARM   Final   Special Requests     Final   Value: BOTTLES DRAWN AEROBIC AND ANAEROBIC AEB=12CC ANA=7CC   Culture NO GROWTH 2 DAYS   Final   Report Status PENDING   Incomplete     Labs: Basic Metabolic  Panel:  Recent Labs Lab 10/19/12 1143 10/20/12 0608 10/21/12 0635 10/22/12 0635  NA 139 137 139 140  K 3.1* 3.6 3.3* 3.7  CL 102 106 105 104  CO2 30 27 28  32  GLUCOSE 111* 95 104* 93  BUN 9 4* 4* 5*  CREATININE 0.69 0.66 0.56 0.56  CALCIUM 8.9 7.7* 8.3* 8.9   Liver Function Tests:  Recent Labs Lab 10/19/12 1143 10/20/12 0608 10/21/12 0635 10/22/12 0635  AST 78* 80* 289* 181*  ALT 95* 86* 282* 273*  ALKPHOS 66 70 156* 165*  BILITOT 0.3 0.2* 0.2* 0.3  PROT 6.9 5.6* 5.8* 6.5  ALBUMIN 3.5 2.8* 2.9* 3.1*    Recent Labs Lab 10/19/12 1143  LIPASE 32   CBC:  Recent Labs Lab 10/19/12 1143 10/20/12 0608 10/21/12 0635 10/22/12 0635  WBC 1.7* 1.2* 3.0* 4.7  NEUTROABS 1.3*  --   --   --   HGB 12.5 10.5* 10.4* 10.8*  HCT 36.2 30.5* 29.9* 31.1*  MCV 88.5 89.7 88.7 88.6  PLT  103* 66* 81* 129*    Principal Problem:   Tick-borne disease Active Problems:   Leukopenia   Thrombocytopenia   Elevated transaminase level   Tachycardia   Hypokalemia   Hypotension, unspecified   Dehydration   GERD (gastroesophageal reflux disease)   Other pancytopenia   Time coordinating discharge: 20 minutes  Signed:  [name], MD [facility] 10/22/2012, 10:28 AM

## 2012-10-24 LAB — CULTURE, BLOOD (ROUTINE X 2)
Culture: NO GROWTH
Culture: NO GROWTH

## 2014-03-06 IMAGING — CR DG CHEST 2V
2 series · 2 of 2 positions shown · non-contrast
Comparison: 08/20/2010.

CLINICAL DATA: Fatigue for 2 days.  Nausea and fever.

CHEST - 2 VIEW

[view not recorded (1 of 2)]
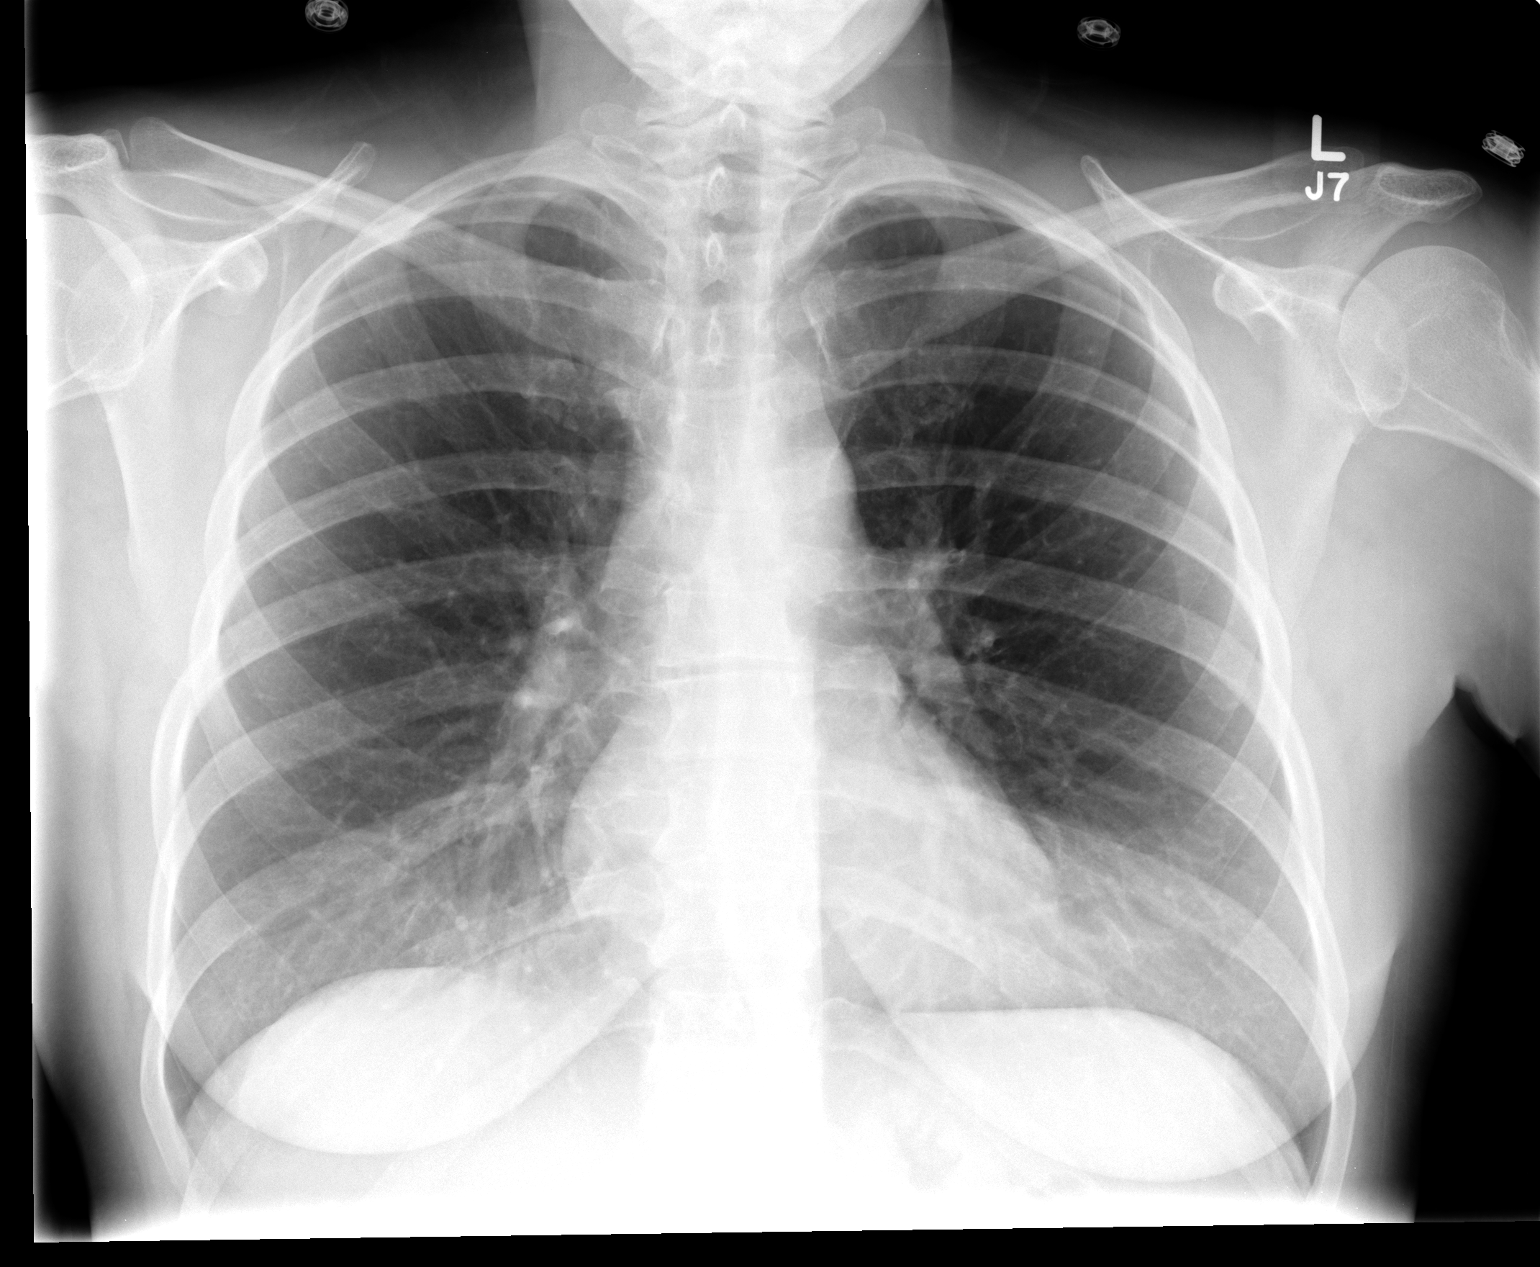

[view not recorded (2 of 2)]
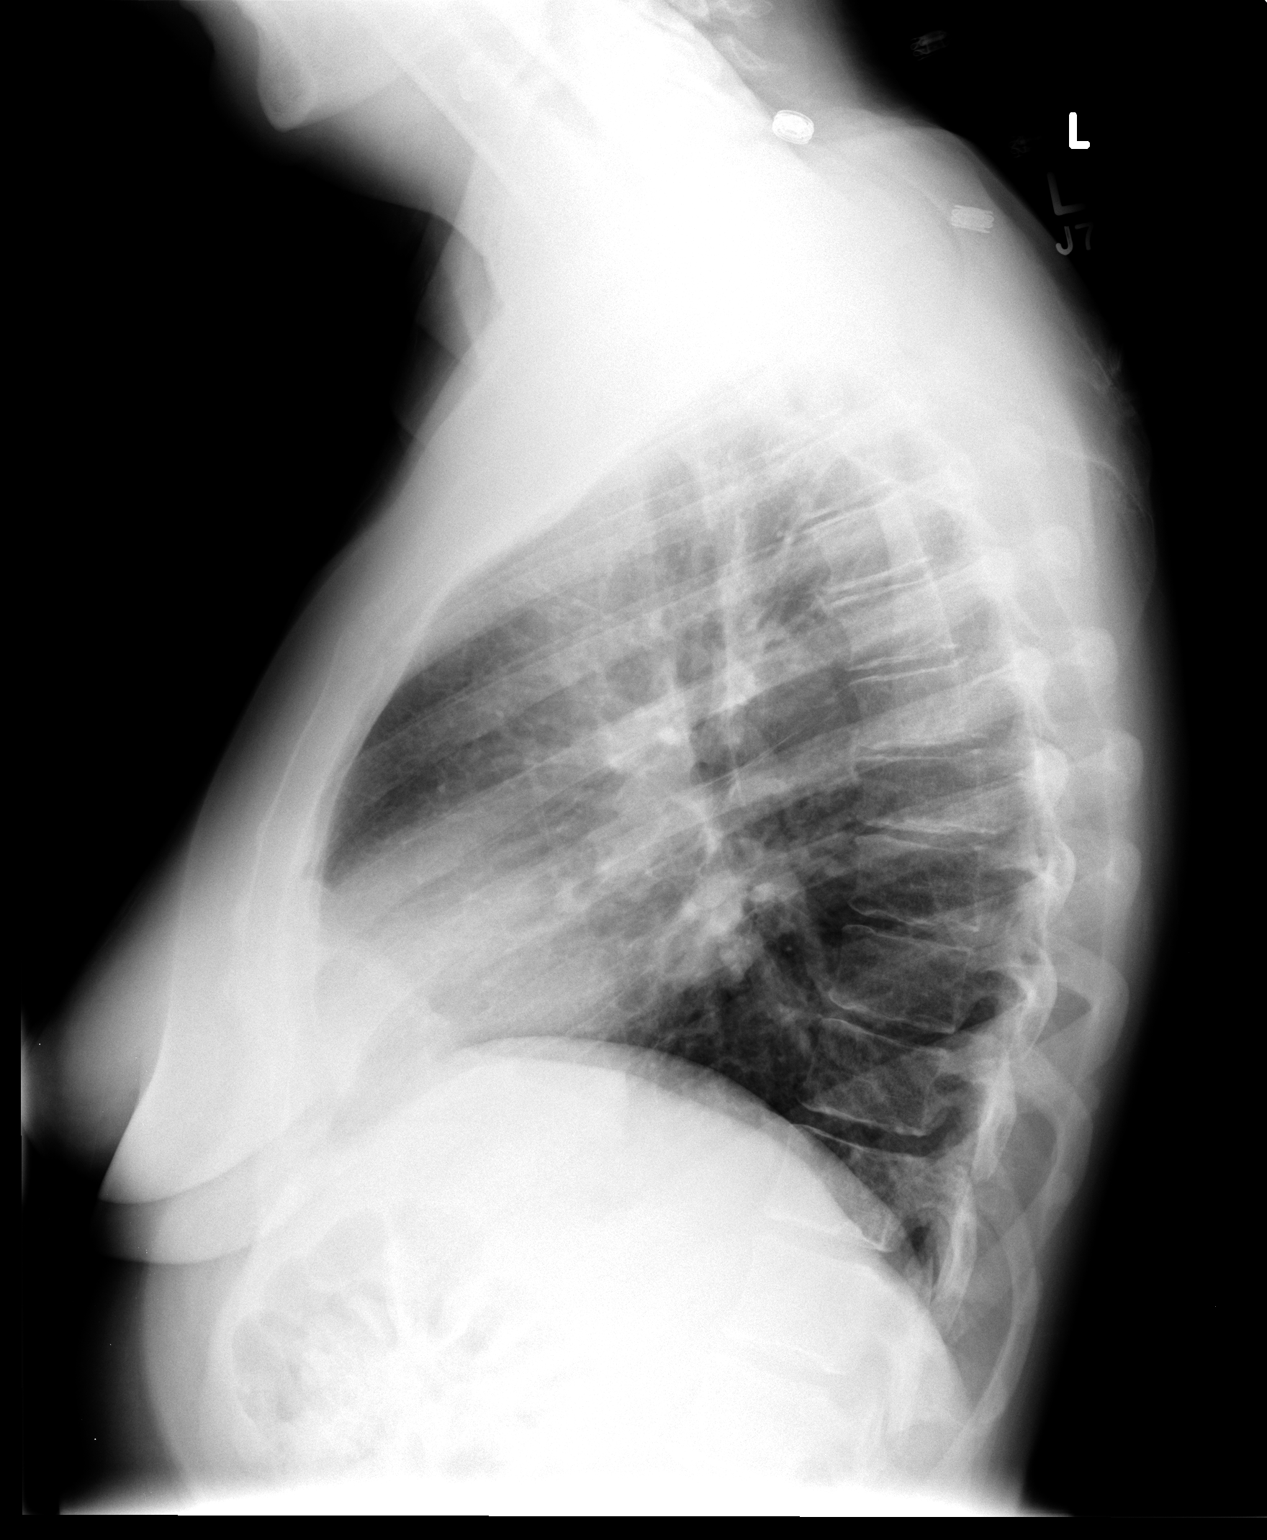

[2 of 2 positions shown; findings below may reference images not displayed]

FINDINGS: No consolidation, edema, effusion, pneumothorax.
Indistinct medial right hemidiaphragm appears similar to prior,
likely related to a fat pad.  Normal heart size and mediastinal
contours.  No acute osseous abnormality.
IMPRESSION: No evidence of active cardiopulmonary disease.

## 2014-08-29 ENCOUNTER — Inpatient Hospital Stay (HOSPITAL_COMMUNITY)
Admission: EM | Admit: 2014-08-29 | Discharge: 2014-09-03 | DRG: 923 | Disposition: A | Discharge status: Home / Self Care | Payer: BLUE CROSS/BLUE SHIELD | Attending: Internal Medicine | Admitting: Internal Medicine

## 2014-08-29 ENCOUNTER — Encounter (HOSPITAL_COMMUNITY): Payer: Self-pay | Admitting: Adult Health

## 2014-08-29 DIAGNOSIS — J309 Allergic rhinitis, unspecified: Secondary | ICD-10-CM | POA: Diagnosis present

## 2014-08-29 DIAGNOSIS — T7840XA Allergy, unspecified, initial encounter: Secondary | ICD-10-CM | POA: Diagnosis not present

## 2014-08-29 DIAGNOSIS — T781XXA Other adverse food reactions, not elsewhere classified, initial encounter: Secondary | ICD-10-CM | POA: Diagnosis not present

## 2014-08-29 DIAGNOSIS — L509 Urticaria, unspecified: Secondary | ICD-10-CM | POA: Diagnosis present

## 2014-08-29 DIAGNOSIS — L508 Other urticaria: Secondary | ICD-10-CM | POA: Diagnosis present

## 2014-08-29 DIAGNOSIS — D72829 Elevated white blood cell count, unspecified: Secondary | ICD-10-CM | POA: Diagnosis present

## 2014-08-29 DIAGNOSIS — X58XXXA Exposure to other specified factors, initial encounter: Secondary | ICD-10-CM | POA: Diagnosis present

## 2014-08-29 MED ORDER — METHYLPREDNISOLONE SODIUM SUCC 125 MG IJ SOLR
125.0000 mg | Freq: Once | INTRAMUSCULAR | Status: AC
  Administered 2014-08-29: 125 mg via INTRAVENOUS
  Filled 2014-08-29: qty 2

## 2014-08-29 MED ORDER — EPINEPHRINE HCL 0.1 MG/ML IJ SOSY
0.3000 mg | PREFILLED_SYRINGE | Freq: Once | INTRAMUSCULAR | Status: DC
  Filled 2014-08-29: qty 10

## 2014-08-29 MED ORDER — RANITIDINE HCL 150 MG/10ML PO SYRP: 150.0000 mg | ORAL_SOLUTION | Freq: Once | ORAL | Status: DC

## 2014-08-29 MED ORDER — EPINEPHRINE 0.3 MG/0.3ML IJ SOAJ
INTRAMUSCULAR | Status: AC
  Filled 2014-08-29: qty 0.3

## 2014-08-29 MED ORDER — FAMOTIDINE IN NACL 20-0.9 MG/50ML-% IV SOLN
20.0000 mg | Freq: Once | INTRAVENOUS | Status: AC
  Administered 2014-08-29: 20 mg via INTRAVENOUS
  Filled 2014-08-29: qty 50

## 2014-08-29 MED ORDER — DIPHENHYDRAMINE HCL 50 MG/ML IJ SOLN
25.0000 mg | Freq: Once | INTRAMUSCULAR | Status: AC
  Administered 2014-08-29: 25 mg via INTRAVENOUS
  Filled 2014-08-29: qty 1

## 2014-08-29 MED ORDER — EPINEPHRINE HCL 1 MG/ML IJ SOLN
0.3000 mg | Freq: Once | INTRAMUSCULAR | Status: AC
  Administered 2014-08-29: 0.3 mg via INTRAMUSCULAR

## 2014-08-29 NOTE — ED Notes (Signed)
Patient states she feels like her tongue swelling has decreased

## 2014-08-29 NOTE — ED Provider Notes (Signed)
CSN: [id number]     Arrival date & time 08/29/14  2133 History   First MD Initiated Contact with Patient 08/29/14 2145     Chief Complaint  Patient presents with  . Allergic Reaction     (Consider location/radiation/quality/duration/timing/severity/associated sxs/prior Treatment) Patient is a 49 y.o. female presenting with allergic reaction. The history is provided by the patient.  Allergic Reaction Presenting symptoms: itching, rash and swelling   Presenting symptoms: no difficulty breathing, no difficulty swallowing and no wheezing   Itching:    Location:  Full body   Severity:  Moderate   Onset quality:  Sudden   Duration:  3 hours   Timing:  Constant   Progression:  Worsening Rash:    Location:  Full body   Quality: itchiness and redness     Severity:  Moderate   Onset quality:  Sudden   Duration:  3 hours   Timing:  Constant   Progression:  Worsening Severity:  Mild Prior allergic episodes:  No prior episodes Context comment:  Ate cookie just prior to symptom onset Relieved by:  None tried Worsened by:  Nothing tried Ineffective treatments:  None tried   History reviewed. No pertinent past medical history. Past Surgical History  Procedure Laterality Date  . Cesarean section    . Tonsillectomy     Family History  Problem Relation Age of Onset  . Urticaria Neg Hx    History  Substance Use Topics  . Smoking status: Never Smoker   . Smokeless tobacco: Never Used  . Alcohol Use: No   OB History    No data available     Review of Systems  Constitutional: Negative for fever, chills, diaphoresis and fatigue.  HENT: Negative for congestion, drooling, facial swelling, rhinorrhea, sore throat, trouble swallowing and voice change.   Eyes: Negative for photophobia and visual disturbance.  Respiratory: Negative for cough, choking, chest tightness, shortness of breath, wheezing and stridor.   Cardiovascular: Negative for chest pain, palpitations and leg swelling.   Gastrointestinal: Negative for nausea, vomiting, abdominal pain, diarrhea and abdominal distention.  Musculoskeletal: Negative for back pain, neck pain and neck stiffness.  Skin: Positive for itching and rash. Negative for color change and pallor.  Neurological: Negative for dizziness, light-headedness and headaches.  All other systems reviewed and are negative.     Allergies  Review of patient's allergies indicates no known allergies.  Home Medications   Prior to Admission medications   Medication Sig Start Date End Date Taking? Authorizing Provider  cetirizine (ZYRTEC) 10 MG tablet Take 10 mg by mouth daily.   Yes Historical Provider, MD  calcium carbonate (TUMS - DOSED IN MG ELEMENTAL CALCIUM) 500 MG chewable tablet Chew 1 tablet (200 mg of elemental calcium total) by mouth 3 (three) times daily with meals. Patient not taking: Reported on 08/29/2014 10/22/12   Samuella Cota, MD  doxycycline (VIBRA-TABS) 100 MG tablet Take 1 tablet (100 mg total) by mouth every 12 (twelve) hours. Patient not taking: Reported on 08/29/2014 10/22/12   Samuella Cota, MD  famotidine (PEPCID) 20 MG tablet Take 1 tablet (20 mg total) by mouth 2 (two) times daily. Patient not taking: Reported on 08/29/2014 10/22/12   Samuella Cota, MD   BP 128/82 mmHg  Pulse 97  Temp(Src) 98.4 F (36.9 C) (Oral)  Resp 19  Ht 5\' 4"  (1.626 m)  Wt 166 lb 10.7 oz (75.6 kg)  BMI 28.59 kg/m2  SpO2 98% Physical Exam  Constitutional: She is  oriented to person, place, and time. She appears well-developed and well-nourished. No distress.  HENT:  Head: Normocephalic and atraumatic.  Mouth/Throat: Mucous membranes are normal. No trismus in the jaw. No uvula swelling. No oropharyngeal exudate, posterior oropharyngeal edema or posterior oropharyngeal erythema.  Eyes: Pupils are equal, round, and reactive to light.  Neck: Normal range of motion. Neck supple.  Cardiovascular: Normal rate, regular rhythm, normal heart  sounds and intact distal pulses.  Exam reveals no gallop and no friction rub.   No murmur heard. Pulmonary/Chest: Effort normal and breath sounds normal. No stridor. No respiratory distress. She has no wheezes. She has no rales.  Abdominal: Soft. Bowel sounds are normal. She exhibits no distension. There is no tenderness. There is no rebound and no guarding.  Musculoskeletal: Normal range of motion. She exhibits no edema or tenderness.  Neurological: She is alert and oriented to person, place, and time. GCS eye subscore is 4. GCS verbal subscore is 5. GCS motor subscore is 6.  Skin: Skin is warm and dry. Rash noted. Rash is urticarial (diffuse). She is not diaphoretic.  Nursing note and vitals reviewed.   ED Course  Procedures (including critical care time) Labs Review Labs Reviewed  GLUCOSE, CAPILLARY - Abnormal; Notable for the following:    Glucose-Capillary 124 (*)    All other components within normal limits  MRSA PCR SCREENING  COMPREHENSIVE METABOLIC PANEL  CBC WITH DIFFERENTIAL/PLATELET  SEDIMENTATION RATE  C-REACTIVE PROTEIN  I-STAT BETA HCG BLOOD, ED ([facility], [facility], [facility] ONLY)    Imaging Review No results found.   EKG Interpretation None      MDM   Final diagnoses:  Allergic reaction, initial encounter    49 yo F with no significant PMH presenting with allergic reaction.  Unknown exposure.  Pt has no known anaphylactic allergies.  Denies new medications or other new exposures.  Onset about 3 pm of diffuse itching and rash after eating a cookie.  Continued to worsen and reports developing funny feeling in throat, tongue and throat feeling thick so came to ED.  States it feels "thick" but not difficult to swallow.  Denies SOB, abdominal pain, N/V/D.    On presentation, pt alert, VSS, speaking in full sentences without difficulty, in NAD.  No wheezing or stridor.  Oropharynx without edema or tongue swelling.  Urticarial rash present diffusely including face, ears, neck, chest,  upper extremities.  Abdomen benign.  No other acute findings.  Consistent with allergic reaction.  Epi, solumedrol, benadryl, zantac given.  Pt reassessed and reports improvement in tongue swelling and funny sensation in throat following this.  Will observe.  Pt observed however had recurrence of facial rash, redness, itching.  Due to rebound symptoms, second dose Epi admininstered and will admit overnight for observation.  Pt agreeable to this plan.  No other acute events during my care.  Discussed with attending Dr. Audie Pinto.     Ellwood Dense, MD 08/31/14 1714  Leonard Schwartz, MD 09/18/14 [phone]

## 2014-08-29 NOTE — ED Notes (Signed)
Presents with urticaria, thick feeling of tongue and throat, feels like "my thorat is feeling really funny"  Itching and hives began at 3 pm today, the throat feeling just started/ pt is able to speak in full sentences, no stridor, breath sounds clear-denies nausea, vomting-continuously clears throat.

## 2014-08-30 ENCOUNTER — Encounter (HOSPITAL_COMMUNITY): Payer: Self-pay | Admitting: Internal Medicine

## 2014-08-30 DIAGNOSIS — L508 Other urticaria: Secondary | ICD-10-CM | POA: Diagnosis present

## 2014-08-30 DIAGNOSIS — L509 Urticaria, unspecified: Secondary | ICD-10-CM | POA: Diagnosis present

## 2014-08-30 DIAGNOSIS — T7840XA Allergy, unspecified, initial encounter: Secondary | ICD-10-CM | POA: Diagnosis present

## 2014-08-30 LAB — CBC WITH DIFFERENTIAL/PLATELET
Basophils Absolute: 0 10*3/uL (ref 0.0–0.1)
Basophils Relative: 0 % (ref 0–1)
EOS PCT: 2 % (ref 0–5)
Eosinophils Absolute: 0.1 10*3/uL (ref 0.0–0.7)
HCT: 39.3 % (ref 36.0–46.0)
Hemoglobin: 13.4 g/dL (ref 12.0–15.0)
LYMPHS ABS: 3.3 10*3/uL (ref 0.7–4.0)
LYMPHS PCT: 38 % (ref 12–46)
MCH: 31.5 pg (ref 26.0–34.0)
MCHC: 34.1 g/dL (ref 30.0–36.0)
MCV: 92.5 fL (ref 78.0–100.0)
MONOS PCT: 7 % (ref 3–12)
Monocytes Absolute: 0.6 10*3/uL (ref 0.1–1.0)
NEUTROS PCT: 53 % (ref 43–77)
Neutro Abs: 4.7 10*3/uL (ref 1.7–7.7)
PLATELETS: 291 10*3/uL (ref 150–400)
RBC: 4.25 MIL/uL (ref 3.87–5.11)
RDW: 12.3 % (ref 11.5–15.5)
WBC: 8.8 10*3/uL (ref 4.0–10.5)

## 2014-08-30 LAB — COMPREHENSIVE METABOLIC PANEL
ALBUMIN: 4.4 g/dL (ref 3.5–5.0)
ALT: 15 U/L (ref 14–54)
ANION GAP: 12 (ref 5–15)
AST: 19 U/L (ref 15–41)
Alkaline Phosphatase: 40 U/L (ref 38–126)
BILIRUBIN TOTAL: 0.5 mg/dL (ref 0.3–1.2)
BUN: 15 mg/dL (ref 6–20)
CALCIUM: 9.2 mg/dL (ref 8.9–10.3)
CO2: 25 mmol/L (ref 22–32)
CREATININE: 0.8 mg/dL (ref 0.44–1.00)
Chloride: 103 mmol/L (ref 101–111)
GFR calc Af Amer: >60 mL/min (ref >60)
GFR calc non Af Amer: >60 mL/min (ref >60)
Glucose, Bld: 85 mg/dL (ref 65–99)
Potassium: 3.6 mmol/L (ref 3.5–5.1)
Sodium: 140 mmol/L (ref 135–145)
TOTAL PROTEIN: 7.5 g/dL (ref 6.5–8.1)

## 2014-08-30 LAB — I-STAT BETA HCG BLOOD, ED (MC, WL, AP ONLY)

## 2014-08-30 LAB — MRSA PCR SCREENING: MRSA BY PCR: NEGATIVE

## 2014-08-30 LAB — GLUCOSE, CAPILLARY: Glucose-Capillary: 124 mg/dL — ABNORMAL HIGH (ref 65–99)

## 2014-08-30 MED ORDER — FAMOTIDINE IN NACL 20-0.9 MG/50ML-% IV SOLN
20.0000 mg | Freq: Two times a day (BID) | INTRAVENOUS | Status: DC
  Administered 2014-08-30 – 2014-08-31 (×3): 20 mg via INTRAVENOUS
  Filled 2014-08-30 (×4): qty 50

## 2014-08-30 MED ORDER — ONDANSETRON HCL 4 MG/2ML IJ SOLN
4.0000 mg | Freq: Four times a day (QID) | INTRAMUSCULAR | Status: DC | PRN
Start: 2014-08-30 — End: 2014-09-03
  Administered 2014-08-31: 4 mg via INTRAVENOUS

## 2014-08-30 MED ORDER — ONDANSETRON HCL 4 MG PO TABS: 4.0000 mg | ORAL_TABLET | Freq: Four times a day (QID) | ORAL | Status: DC | PRN

## 2014-08-30 MED ORDER — DIAZEPAM 2 MG PO TABS
1.0000 mg | ORAL_TABLET | Freq: Every day | ORAL | Status: DC
  Administered 2014-08-30: 1 mg via ORAL
  Filled 2014-08-30: qty 1

## 2014-08-30 MED ORDER — DIPHENHYDRAMINE HCL 50 MG/ML IJ SOLN
25.0000 mg | Freq: Four times a day (QID) | INTRAMUSCULAR | Status: DC | PRN
  Administered 2014-08-30 – 2014-08-31 (×4): 25 mg via INTRAVENOUS
  Filled 2014-08-30 (×5): qty 1

## 2014-08-30 MED ORDER — ACETAMINOPHEN 650 MG RE SUPP: 650.0000 mg | Freq: Four times a day (QID) | RECTAL | Status: DC | PRN

## 2014-08-30 MED ORDER — DIPHENHYDRAMINE-ZINC ACETATE 2-0.1 % EX CREA
TOPICAL_CREAM | Freq: Two times a day (BID) | CUTANEOUS | Status: DC | PRN
  Administered 2014-08-30 – 2014-08-31 (×2): via TOPICAL
  Filled 2014-08-30 (×3): qty 28

## 2014-08-30 MED ORDER — EPINEPHRINE 0.3 MG/0.3ML IJ SOAJ
INTRAMUSCULAR | Status: AC
  Administered 2014-08-30: 0.3 mg via INTRAMUSCULAR
  Filled 2014-08-30: qty 0.3

## 2014-08-30 MED ORDER — LORATADINE 10 MG PO TABS
10.0000 mg | ORAL_TABLET | Freq: Every day | ORAL | Status: DC
  Administered 2014-08-30 – 2014-09-03 (×5): 10 mg via ORAL
  Filled 2014-08-30 (×6): qty 1

## 2014-08-30 MED ORDER — METHYLPREDNISOLONE SODIUM SUCC 40 MG IJ SOLR
40.0000 mg | Freq: Once | INTRAMUSCULAR | Status: AC
  Administered 2014-08-31: 40 mg via INTRAVENOUS
  Filled 2014-08-30: qty 1

## 2014-08-30 MED ORDER — EPINEPHRINE HCL 1 MG/ML IJ SOLN: 0.3000 mg | Freq: Once | INTRAMUSCULAR | Status: DC

## 2014-08-30 MED ORDER — ACETAMINOPHEN 325 MG PO TABS: 650.0000 mg | ORAL_TABLET | Freq: Four times a day (QID) | ORAL | Status: DC | PRN

## 2014-08-30 MED ORDER — METHYLPREDNISOLONE SODIUM SUCC 125 MG IJ SOLR
40.0000 mg | Freq: Every day | INTRAMUSCULAR | Status: DC
  Administered 2014-08-30: 40 mg via INTRAVENOUS
  Filled 2014-08-30: qty 2
  Filled 2014-08-30: qty 0.64

## 2014-08-30 MED ORDER — SODIUM CHLORIDE 0.9 % IV SOLN
INTRAVENOUS | Status: DC
  Administered 2014-08-30: 07:00:00 via INTRAVENOUS

## 2014-08-30 NOTE — ED Notes (Signed)
Patient presents stating earlier today she ate a cookie chocolate and caramel and noticed later that she has redness and swelling to her left thumb.  Stated through out the day noticed that she had more red areas on her body and her ears were getting red and swollen.  Was at a high school function and noticed that her tongue was swelling and that she felt like there was something in her throat. No resp distress noted upon arrival to [facility]

## 2014-08-30 NOTE — ED Notes (Signed)
Redness continues to ears.  States she is feeling better but her ears and neck are itching

## 2014-08-30 NOTE — H&P (Signed)
[facility] History and Physical  Holly Allison BZJ:[id number] DOB: 1966-02-26 DOA: 08/29/2014  Referring physician: Dr. Rogers Blocker. PCP: Manon Hilding, MD  Specialists: None.  Chief Complaint: Skin rash with itching.  HPI: Holly Allison is a 49 y.o. female history of allergic rhinitis and previous history of tickborne disease presents to the ER because of skin rash with itching. Patient states his symptoms started last evening which has been involving her whole body. Patient started experiencing some difficulty breathing like sensation when she came to the ER. In the ER patient was initially given epinephrine following which patient has improved but about a couple of hours rash came back and patient also again started having sensation of throat tightening. There was no obvious tongue swelling or stridor. Patient was given a repeat epinephrine dose and at this time Solu-Medrol and Pepcid were given. Patient denies any recent new medications or detergents or fragments or insect bite. Patient is afebrile and will be admitted for further observation. On exam patient has no stridor no wheezing no tongue swelling.   Review of Systems: As presented in the history of presenting illness, rest negative.  History reviewed. No pertinent past medical history. Past Surgical History  Procedure Laterality Date  . Cesarean section    . Tonsillectomy     Social History:  reports that she has never smoked. She has never used smokeless tobacco. She reports that she does not drink alcohol or use illicit drugs. Where does patient live at home. Can patient participate in ADLs? Yes.  No Known Allergies  Family History:  Family History  Problem Relation Age of Onset  . Urticaria Neg Hx       Prior to Admission medications   Medication Sig Start Date End Date Taking? Authorizing Provider  cetirizine (ZYRTEC) 10 MG tablet Take 10 mg by mouth daily.   Yes Historical Provider, MD  calcium carbonate (TUMS  - DOSED IN MG ELEMENTAL CALCIUM) 500 MG chewable tablet Chew 1 tablet (200 mg of elemental calcium total) by mouth 3 (three) times daily with meals. Patient not taking: Reported on 08/29/2014 10/22/12   Samuella Cota, MD  doxycycline (VIBRA-TABS) 100 MG tablet Take 1 tablet (100 mg total) by mouth every 12 (twelve) hours. Patient not taking: Reported on 08/29/2014 10/22/12   Samuella Cota, MD  famotidine (PEPCID) 20 MG tablet Take 1 tablet (20 mg total) by mouth 2 (two) times daily. Patient not taking: Reported on 08/29/2014 10/22/12   Samuella Cota, MD    Physical Exam: Filed Vitals:   08/29/14 2345 08/30/14 0000 08/30/14 0003 08/30/14 0015  BP: 103/65 116/72 116/72 122/71  Pulse: 90 89 87 96  Temp:      TempSrc:      Resp: 17 17 22 22   SpO2: 99% 100% 99% 100%     General:  Well-developed and nourished.  Eyes: Anicteric no pallor.  ENT: No tongue swelling.  Neck: No stridor.  Cardiovascular: S1-S2 heard.  Respiratory: No rhonchi or crepitations.  Abdomen: Soft nontender bowel sounds present.  Skin: There is an urticarial rash on her extremities and trunk.  Musculoskeletal: No edema.  Psychiatric: Appears normal.  Neurologic: Alert awake oriented to time place and person. Moves all extremities.  Labs on Admission:  Basic Metabolic Panel:  Recent Labs Lab 08/30/14  NA 140  K 3.6  CL 103  CO2 25  GLUCOSE 85  BUN 15  CREATININE 0.80  CALCIUM 9.2   Liver Function Tests:  Recent Labs Lab  08/30/14  AST 19  ALT 15  ALKPHOS 40  BILITOT 0.5  PROT 7.5  ALBUMIN 4.4   No results for input(s): LIPASE, AMYLASE in the last 168 hours. No results for input(s): AMMONIA in the last 168 hours. CBC:  Recent Labs Lab 08/30/14  WBC 8.8  NEUTROABS 4.7  HGB 13.4  HCT 39.3  MCV 92.5  PLT 291   Cardiac Enzymes: No results for input(s): CKTOTAL, CKMB, CKMBINDEX, TROPONINI in the last 168 hours.  BNP (last 3 results) No results for input(s): BNP in the  last 8760 hours.  ProBNP (last 3 results) No results for input(s): PROBNP in the last 8760 hours.  CBG: No results for input(s): GLUCAP in the last 168 hours.  Radiological Exams on Admission: No results found.   Assessment/Plan Principal Problem:   Urticaria, acute Active Problems:   Allergic reaction   Urticaria   1. Acute urticaria - with patient having sensation of throat tightening at this time we will be observing. I have continued patient on Solu-Medrol Pepcid and when necessary Benadryl. Cause is not clear. Patient will eventually need follow-up with allergist. 2. Allergic rhinitis continue present medications.   DVT Prophylaxis SCDs.  Code Status: Full code.  Family Communication: Patient's husband at the bedside.  Disposition Plan: Admit for observation.    Caleb Prigmore N. [facility] Pager [phone].  If 7PM-7AM, please contact night-coverage [email or web address] Password [email or web address] 08/30/2014, 2:35 AM

## 2014-08-30 NOTE — ED Notes (Signed)
Breakfast tray order @0637 .

## 2014-08-30 NOTE — ED Notes (Addendum)
Patient states her itching is coming back, ears are red and hands beginning to itch again  MD aware.  No SOB noted

## 2014-08-30 NOTE — Progress Notes (Signed)
Patient admitted after midnight, please see H&P.  Has had this in the past related to stress- dr gave low dose diazapam before bed.  Patient recently has been very stressed.  Holly Allison

## 2014-08-31 DIAGNOSIS — T7840XS Allergy, unspecified, sequela: Secondary | ICD-10-CM

## 2014-08-31 DIAGNOSIS — J309 Allergic rhinitis, unspecified: Secondary | ICD-10-CM | POA: Diagnosis present

## 2014-08-31 DIAGNOSIS — X58XXXA Exposure to other specified factors, initial encounter: Secondary | ICD-10-CM | POA: Diagnosis present

## 2014-08-31 DIAGNOSIS — L508 Other urticaria: Secondary | ICD-10-CM | POA: Diagnosis not present

## 2014-08-31 DIAGNOSIS — L509 Urticaria, unspecified: Secondary | ICD-10-CM | POA: Diagnosis present

## 2014-08-31 DIAGNOSIS — D72829 Elevated white blood cell count, unspecified: Secondary | ICD-10-CM | POA: Diagnosis present

## 2014-08-31 DIAGNOSIS — T7840XA Allergy, unspecified, initial encounter: Secondary | ICD-10-CM | POA: Diagnosis present

## 2014-08-31 DIAGNOSIS — T781XXA Other adverse food reactions, not elsewhere classified, initial encounter: Secondary | ICD-10-CM | POA: Diagnosis present

## 2014-08-31 LAB — C-REACTIVE PROTEIN: CRP: 5.6 mg/dL — AB (ref <1.0)

## 2014-08-31 LAB — SEDIMENTATION RATE: SED RATE: 20 mm/h (ref 0–22)

## 2014-08-31 MED ORDER — DIAZEPAM 2 MG PO TABS
1.0000 mg | ORAL_TABLET | Freq: Four times a day (QID) | ORAL | Status: DC | PRN
  Administered 2014-08-31 – 2014-09-01 (×2): 1 mg via ORAL
  Filled 2014-08-31 (×2): qty 1

## 2014-08-31 MED ORDER — DIPHENHYDRAMINE HCL 50 MG/ML IJ SOLN
25.0000 mg | INTRAMUSCULAR | Status: DC | PRN
  Administered 2014-08-31 – 2014-09-02 (×7): 50 mg via INTRAVENOUS
  Filled 2014-08-31 (×7): qty 1

## 2014-08-31 MED ORDER — FAMOTIDINE 20 MG PO TABS
40.0000 mg | ORAL_TABLET | Freq: Two times a day (BID) | ORAL | Status: DC
  Administered 2014-08-31 – 2014-09-03 (×6): 40 mg via ORAL
  Filled 2014-08-31: qty 2
  Filled 2014-08-31 (×3): qty 1
  Filled 2014-08-31: qty 2
  Filled 2014-08-31: qty 1
  Filled 2014-08-31 (×2): qty 2

## 2014-08-31 MED ORDER — METHYLPREDNISOLONE SODIUM SUCC 125 MG IJ SOLR
80.0000 mg | Freq: Two times a day (BID) | INTRAMUSCULAR | Status: DC
  Administered 2014-08-31 – 2014-09-01 (×3): 80 mg via INTRAVENOUS
  Filled 2014-08-31: qty 1.28
  Filled 2014-08-31 (×2): qty 2
  Filled 2014-08-31: qty 1.28

## 2014-08-31 MED ORDER — FAMOTIDINE 20 MG PO TABS
20.0000 mg | ORAL_TABLET | Freq: Two times a day (BID) | ORAL | Status: DC
  Filled 2014-08-31: qty 1

## 2014-08-31 MED ORDER — DIPHENHYDRAMINE HCL 50 MG/ML IJ SOLN
25.0000 mg | INTRAMUSCULAR | Status: DC | PRN
  Administered 2014-08-31: 25 mg via INTRAVENOUS

## 2014-08-31 NOTE — Progress Notes (Signed)
Hypoallergenic bedding was given to patient. Ice packs and cool rags given to aid the itching sensation experienced by patient. MD made aware regarding increase in hives seen. Will continue to monitor.

## 2014-08-31 NOTE — Progress Notes (Addendum)
  PROGRESS NOTE  Rupa Lagan RAQ:[id number] DOB: Jul 03, 1965 DOA: 08/29/2014 PCP: Manon Hilding, MD  Assessment/Plan: Acute urticaria - -Solu-Medrol- increase dose, Pepcid and when necessary Benadryl.  ?tick bite previously- ? If patient has developed an allergy to meat- will stop all beef, lamb and pork--- had another episode after eating roast last night -check sed and c reactive protein  Allergic rhinitis continue present medications  Code Status: full Family Communication: patient Disposition Plan:    Consultants:    Procedures:      HPI/Subjective: Had break out of hives after eating roast  Objective: Filed Vitals:   08/31/14 0725  BP: 115/57  Pulse: 80  Temp:   Resp: 15    Intake/Output Summary (Last 24 hours) at 08/31/14 1201 Last data filed at 08/31/14 0903  Gross per 24 hour  Intake   1010 ml  Output      0 ml  Net   1010 ml   Filed Weights   08/30/14 0951 08/30/14 2125  Weight: 74.7 kg (164 lb 10.9 oz) 75.6 kg (166 lb 10.7 oz)    Exam:   General:  A+Ox3, NAD  Cardiovascular: rrr  Respiratory: clear  Abdomen: +Bs, soft  Musculoskeletal: no edema   Data Reviewed: Basic Metabolic Panel:  Recent Labs Lab 08/30/14  NA 140  K 3.6  CL 103  CO2 25  GLUCOSE 85  BUN 15  CREATININE 0.80  CALCIUM 9.2   Liver Function Tests:  Recent Labs Lab 08/30/14  AST 19  ALT 15  ALKPHOS 40  BILITOT 0.5  PROT 7.5  ALBUMIN 4.4   No results for input(s): LIPASE, AMYLASE in the last 168 hours. No results for input(s): AMMONIA in the last 168 hours. CBC:  Recent Labs Lab 08/30/14  WBC 8.8  NEUTROABS 4.7  HGB 13.4  HCT 39.3  MCV 92.5  PLT 291   Cardiac Enzymes: No results for input(s): CKTOTAL, CKMB, CKMBINDEX, TROPONINI in the last 168 hours. BNP (last 3 results) No results for input(s): BNP in the last 8760 hours.  ProBNP (last 3 results) No results for input(s): PROBNP in the last 8760 hours.  CBG:  Recent Labs Lab  08/30/14 1003  GLUCAP 124*    Recent Results (from the past 240 hour(s))  MRSA PCR Screening     Status: None   Collection Time: 08/30/14  9:53 AM  Result Value Ref Range Status   MRSA by PCR NEGATIVE NEGATIVE Final    Comment:        The GeneXpert MRSA Assay (FDA approved for NASAL specimens only), is one component of a comprehensive MRSA colonization surveillance program. It is not intended to diagnose MRSA infection nor to guide or monitor treatment for MRSA infections.      Studies: No results found.  Scheduled Meds: . diazepam  1 mg Oral QHS  . famotidine (PEPCID) IV  20 mg Intravenous Q12H  . loratadine  10 mg Oral Daily  . methylPREDNISolone (SOLU-MEDROL) injection  80 mg Intravenous Q12H   Continuous Infusions:  Antibiotics Given (last 72 hours)    None      Principal Problem:   Urticaria, acute Active Problems:   Allergic reaction   Urticaria    Time spent: 25 min    Rai Sinagra  [facility] Pager [phone]. If 7PM-7AM, please contact night-coverage at [email or web address], password [facility] 08/31/2014, 12:01 PM

## 2014-09-01 LAB — BASIC METABOLIC PANEL
Anion gap: 9 (ref 5–15)
BUN: 18 mg/dL (ref 6–20)
CO2: 28 mmol/L (ref 22–32)
Calcium: 9.2 mg/dL (ref 8.9–10.3)
Chloride: 104 mmol/L (ref 101–111)
Creatinine, Ser: 0.73 mg/dL (ref 0.44–1.00)
Glucose, Bld: 154 mg/dL — ABNORMAL HIGH (ref 65–99)
POTASSIUM: 3.8 mmol/L (ref 3.5–5.1)
Sodium: 141 mmol/L (ref 135–145)

## 2014-09-01 LAB — CBC
HCT: 39.8 % (ref 36.0–46.0)
Hemoglobin: 13.7 g/dL (ref 12.0–15.0)
MCH: 31.9 pg (ref 26.0–34.0)
MCHC: 34.4 g/dL (ref 30.0–36.0)
MCV: 92.6 fL (ref 78.0–100.0)
Platelets: 277 K/uL (ref 150–400)
RBC: 4.3 MIL/uL (ref 3.87–5.11)
RDW: 12.4 % (ref 11.5–15.5)
WBC: 16.6 K/uL — ABNORMAL HIGH (ref 4.0–10.5)

## 2014-09-01 LAB — HIV ANTIBODY (ROUTINE TESTING W REFLEX): HIV Screen 4th Generation wRfx: NONREACTIVE

## 2014-09-01 MED ORDER — PREDNISONE 50 MG PO TABS
50.0000 mg | ORAL_TABLET | Freq: Every day | ORAL | Status: DC
  Administered 2014-09-01 – 2014-09-02 (×2): 50 mg via ORAL
  Filled 2014-09-01 (×3): qty 1

## 2014-09-01 MED ORDER — HYDROXYZINE HCL 25 MG PO TABS
25.0000 mg | ORAL_TABLET | Freq: Once | ORAL | Status: AC
  Administered 2014-09-01: 25 mg via ORAL
  Filled 2014-09-01: qty 1

## 2014-09-01 MED ORDER — METHYLPREDNISOLONE SODIUM SUCC 125 MG IJ SOLR
80.0000 mg | Freq: Once | INTRAMUSCULAR | Status: AC
  Administered 2014-09-01: 80 mg via INTRAVENOUS
  Filled 2014-09-01: qty 2

## 2014-09-01 NOTE — Progress Notes (Signed)
Patient c/o generalized itchiness and the coming back of redness/rashes at BLE (thighs), BUE, and face.  Administered PRN Benadryl 50 mg INJ at 2001 and noted no changes in rashes and itchiness. Patient is on vegetarian diet and ate specially-made grilled sandwich. Administered PRN diazepam 1mg  tab at 2201 due to increase anxiety and noted effective. Paged floor coverage MD, and K. Schorr ordered for Vistaril 25 mg tab once, and gave this at 2231 to patient.  K. Schorr ordered a Solumedrol 80 mg Inj once and this was administered at 2255.  According to patient, her itchiness had reduced but still noted the rashes/redness at bilateral thighs, bilateral arms and face.  Patient denies any shortness of breath.  Will continue to monitor patient.  Husband at bedside and patient resting on bed with both eyes close as of this writing (2354).

## 2014-09-01 NOTE — Progress Notes (Addendum)
PROGRESS NOTE  Holly Allison [id number] DOB: Jul 17, 1965 DOA: 08/29/2014 PCP: Manon Hilding, MD  Holly Allison is a 49 y.o. female history of allergic rhinitis and previous history of tickborne disease presents to the ER because of skin rash with itching. Patient states his symptoms started last evening which has been involving her whole body. Initially had improvement with steroids, Pepcid and benadryl but then ate beef and had another flare.  Increase benadryl and pepcid and d/c'd all meat and patient improved.  Patient has had episodes in the past and was treated with diazapam for stress induced urticaria.  Patient does endorse increased recent stress.  Assessment/Plan: Acute urticaria - -had flare yesterday but improved today with increase in pepcid and benadryl and change to vegetarian diet -Solu-Medrol- change to PO steroids -continue benadryl, pepcid ?tick bite previously- ? If patient has developed an allergy to meat after tick bite:alpha-gal - will stop all beef, lamb and pork--- had episode after eating roast last night -SED rate ok, CRP elevated -side bar with on-call ID- added HIV test - will need allergy appointment as outpatient  Allergic rhinitis continue present medications  Leukocytosis -from steroids   Code Status: full Family Communication: patient/family at bedside Disposition Plan: tx to med surge- hope for d/c in AM   Consultants:    Procedures:      HPI/Subjective: Rash has improved Less itching  Objective: Filed Vitals:   09/01/14 0841  BP: 120/74  Pulse: 92  Temp: 98.3 F (36.8 C)  Resp: 17    Intake/Output Summary (Last 24 hours) at 09/01/14 0907 Last data filed at 08/31/14 2055  Gross per 24 hour  Intake    840 ml  Output      0 ml  Net    840 ml   Filed Weights   08/30/14 0951 08/30/14 2125  Weight: 74.7 kg (164 lb 10.9 oz) 75.6 kg (166 lb 10.7 oz)    Exam:   General:  A+Ox3, NAD  Cardiovascular: rrr  Respiratory:  clear  Abdomen: +Bs, soft  Skin: areas of rash on back of neck, inner thigh, upper arms and feet- urticarial in appearance  Data Reviewed: Basic Metabolic Panel:  Recent Labs Lab 08/30/14 09/01/14 0307  NA 140 141  K 3.6 3.8  CL 103 104  CO2 25 28  GLUCOSE 85 154*  BUN 15 18  CREATININE 0.80 0.73  CALCIUM 9.2 9.2   Liver Function Tests:  Recent Labs Lab 08/30/14  AST 19  ALT 15  ALKPHOS 40  BILITOT 0.5  PROT 7.5  ALBUMIN 4.4   No results for input(s): LIPASE, AMYLASE in the last 168 hours. No results for input(s): AMMONIA in the last 168 hours. CBC:  Recent Labs Lab 08/30/14 09/01/14 0307  WBC 8.8 16.6*  NEUTROABS 4.7  --   HGB 13.4 13.7  HCT 39.3 39.8  MCV 92.5 92.6  PLT 291 277   Cardiac Enzymes: No results for input(s): CKTOTAL, CKMB, CKMBINDEX, TROPONINI in the last 168 hours. BNP (last 3 results) No results for input(s): BNP in the last 8760 hours.  ProBNP (last 3 results) No results for input(s): PROBNP in the last 8760 hours.  CBG:  Recent Labs Lab 08/30/14 1003  GLUCAP 124*    Recent Results (from the past 240 hour(s))  MRSA PCR Screening     Status: None   Collection Time: 08/30/14  9:53 AM  Result Value Ref Range Status   MRSA by PCR NEGATIVE NEGATIVE Final  Comment:        The GeneXpert MRSA Assay (FDA approved for NASAL specimens only), is one component of a comprehensive MRSA colonization surveillance program. It is not intended to diagnose MRSA infection nor to guide or monitor treatment for MRSA infections.      Studies: No results found.  Scheduled Meds: . famotidine  40 mg Oral BID  . loratadine  10 mg Oral Daily  . predniSONE  50 mg Oral Q breakfast   Continuous Infusions:  Antibiotics Given (last 72 hours)    None      Principal Problem:   Urticaria, acute Active Problems:   Allergic reaction   Urticaria   Acute urticaria    Time spent: 25 min    Jedidiah Demartini  [facility] Pager  [phone]. If 7PM-7AM, please contact night-coverage at [email or web address], password [facility] 09/01/2014, 9:07 AM  LOS: 1 day

## 2014-09-02 DIAGNOSIS — L508 Other urticaria: Secondary | ICD-10-CM

## 2014-09-02 MED ORDER — PREDNISONE 50 MG PO TABS
60.0000 mg | ORAL_TABLET | Freq: Two times a day (BID) | ORAL | Status: DC
  Administered 2014-09-02 – 2014-09-03 (×2): 60 mg via ORAL
  Filled 2014-09-02 (×4): qty 1

## 2014-09-02 MED ORDER — HYDROXYZINE HCL 25 MG PO TABS
25.0000 mg | ORAL_TABLET | Freq: Four times a day (QID) | ORAL | Status: DC
  Administered 2014-09-02 – 2014-09-03 (×4): 25 mg via ORAL
  Filled 2014-09-02 (×4): qty 1

## 2014-09-02 NOTE — Progress Notes (Signed)
PROGRESS NOTE  Kila Godina HEN:[id number] DOB: 09-Aug-1965 DOA: 08/29/2014 PCP: Manon Hilding, MD  Holly Allison is a 49 y.o. female history of allergic rhinitis and previous history of tickborne disease presents to the ER because of skin rash with itching. Patient states his symptoms started last evening which has been involving her whole body. Initially had improvement with steroids, Pepcid and benadryl but then ate beef and had another flare.  Increase benadryl and pepcid and d/c'd all meat and patient improved.  Patient has had episodes in the past and was treated with diazapam for stress induced urticaria.  Patient does endorse increased recent stress.  Assessment/Plan: Acute urticaria - Recurred last night severe. Still with urticaria today. Increase pred to 60 bid.  Change H1 to atarax scheduled. Cont pepcid. outpt allergy referral  Allergic rhinitis continue present medications  Leukocytosis -from steroids   Code Status: full Family Communication: patient/family at bedside Disposition Plan: not stable for discharge   Consultants:    Procedures:      HPI/Subjective: Still with rash. And itching. Severe last night. No facial or tongue swelling. Denies wheeze  Objective: Filed Vitals:   09/02/14 0535  BP: 104/62  Pulse: 77  Temp: 98.5 F (36.9 C)  Resp: 17    Intake/Output Summary (Last 24 hours) at 09/02/14 1202 Last data filed at 09/01/14 2230  Gross per 24 hour  Intake    600 ml  Output      0 ml  Net    600 ml   Filed Weights   08/30/14 0951 08/30/14 2125  Weight: 74.7 kg (164 lb 10.9 oz) 75.6 kg (166 lb 10.7 oz)    Exam:   General:  A+Ox3, NAD  Cardiovascular: rrr  Respiratory: clear  Abdomen: +Bs, soft  Skin: erythematous macules and patches on legs, arms and trunk  Data Reviewed: Basic Metabolic Panel:  Recent Labs Lab 08/30/14 09/01/14 0307  NA 140 141  K 3.6 3.8  CL 103 104  CO2 25 28  GLUCOSE 85 154*  BUN 15 18    CREATININE 0.80 0.73  CALCIUM 9.2 9.2   Liver Function Tests:  Recent Labs Lab 08/30/14  AST 19  ALT 15  ALKPHOS 40  BILITOT 0.5  PROT 7.5  ALBUMIN 4.4   No results for input(s): LIPASE, AMYLASE in the last 168 hours. No results for input(s): AMMONIA in the last 168 hours. CBC:  Recent Labs Lab 08/30/14 09/01/14 0307  WBC 8.8 16.6*  NEUTROABS 4.7  --   HGB 13.4 13.7  HCT 39.3 39.8  MCV 92.5 92.6  PLT 291 277   Cardiac Enzymes: No results for input(s): CKTOTAL, CKMB, CKMBINDEX, TROPONINI in the last 168 hours. BNP (last 3 results) No results for input(s): BNP in the last 8760 hours.  ProBNP (last 3 results) No results for input(s): PROBNP in the last 8760 hours.  CBG:  Recent Labs Lab 08/30/14 1003  GLUCAP 124*    Recent Results (from the past 240 hour(s))  MRSA PCR Screening     Status: None   Collection Time: 08/30/14  9:53 AM  Result Value Ref Range Status   MRSA by PCR NEGATIVE NEGATIVE Final    Comment:        The GeneXpert MRSA Assay (FDA approved for NASAL specimens only), is one component of a comprehensive MRSA colonization surveillance program. It is not intended to diagnose MRSA infection nor to guide or monitor treatment for MRSA infections.      Studies: No  results found.  Scheduled Meds: . famotidine  40 mg Oral BID  . loratadine  10 mg Oral Daily  . predniSONE  50 mg Oral Q breakfast   Continuous Infusions:  Antibiotics Given (last 72 hours)    None      Principal Problem:   Urticaria, acute Active Problems:   Allergic reaction   Urticaria   Acute urticaria    Time spent: 25 min    [facility] Pager [phone]. If 7PM-7AM, please contact night-coverage at [email or web address], password [facility] 09/02/2014, 12:02 PM  LOS: 2 days

## 2014-09-03 MED ORDER — EPINEPHRINE HCL 1 MG/ML IJ SOLN: 1.0000 mg | Freq: Once | INTRAMUSCULAR | Status: DC

## 2014-09-03 MED ORDER — FAMOTIDINE 40 MG PO TABS: 40.0000 mg | ORAL_TABLET | Freq: Two times a day (BID) | ORAL | Status: DC

## 2014-09-03 MED ORDER — PREDNISONE 20 MG PO TABS: 60.0000 mg | ORAL_TABLET | Freq: Every day | ORAL | Status: DC

## 2014-09-03 MED ORDER — HYDROXYZINE HCL 25 MG PO TABS: 25.0000 mg | ORAL_TABLET | Freq: Four times a day (QID) | ORAL | Status: DC

## 2014-09-03 NOTE — Care Management Note (Signed)
Case Management Note  Patient Details  Name: Holly Allison MRN: [id number] Date of Birth: 04/17/1965  Subjective/Objective:                    Action/Plan:   Expected Discharge Date:       09-03-14            Expected Discharge Plan:  Home/Self Care  In-House Referral:     Discharge planning Services     Post Acute Care Choice:    Choice offered to:     DME Arranged:    DME Agency:     HH Arranged:    [place] Agency:     Status of Service:  In process, will continue to follow  Medicare Important Message Given:    Date Medicare IM Given:    Medicare IM give by:    Date Additional Medicare IM Given:    Additional Medicare Important Message give by:     If discussed at [place] of Stay Meetings, dates discussed:  09-03-14   Additional Comments:  [name], RN 09/03/2014, 8:21 AM

## 2014-09-03 NOTE — Progress Notes (Signed)
Verbally understood DC instructions, no questions asked, husband at bedside

## 2014-09-03 NOTE — Care Management Note (Signed)
Case Management Note  Patient Details  Name: Holly Allison MRN: [id number] Date of Birth: 29-Mar-1966  Subjective/Objective:                    Action/Plan: Patient needing assistance with Epi Pen , printed off online instructions and phone number to call to apply for assistance. Explained to patient . Patient stated she will apply . Magdalen Spatz RN BSN   Expected Discharge Date:      09-03-14             Expected Discharge Plan:  Home/Self Care  In-House Referral:     Discharge planning Services     Post Acute Care Choice:    Choice offered to:     DME Arranged:    DME Agency:     HH Arranged:    HH Agency:     Status of Service:  Completed, signed off  Medicare Important Message Given:    Date Medicare IM Given:    Medicare IM give by:    Date Additional Medicare IM Given:    Additional Medicare Important Message give by:     If discussed at [place] of Stay Meetings, dates discussed:    Additional Comments:  [name], RN 09/03/2014, 11:07 AM

## 2014-09-03 NOTE — Progress Notes (Signed)
To whom it may concern:   Holly Allison is unable to work 08/29/14 - 09/03/14 due to illness.    Sincerely,    Doree Barthel, MD [facility]

## 2014-09-03 NOTE — Discharge Summary (Signed)
Physician Discharge Summary  Holly Allison WCH:[id number] DOB: July 25, 1965 DOA: 08/29/2014  PCP: Manon Hilding, MD  Admit date: 08/29/2014 Discharge date: 09/03/2014  Time spent: greater than 30 minutes  1. Recommendations for Outpatient Followup 2. Referral for allergy testing  Discharge Diagnoses:  Principal Problem:   Urticaria, acute Active Problems:   Allergic reaction   Urticaria   Acute urticaria   Discharge Condition: stable  Diet recommendation: a tolerated  Filed Weights   08/30/14 0951 08/30/14 2125  Weight: 74.7 kg (164 lb 10.9 oz) 75.6 kg (166 lb 10.7 oz)    History of present illness:  49 y.o. female history of allergic rhinitis and previous history of tickborne disease presents to the ER because of skin rash with itching. Patient states his symptoms started last evening which has been involving her whole body. Patient started experiencing some difficulty breathing like sensation when she came to the ER. In the ER patient was initially given epinephrine following which patient has improved but about a couple of hours rash came back and patient also again started having sensation of throat tightening. There was no obvious tongue swelling or stridor. Patient was given a repeat epinephrine dose and at this time Solu-Medrol and Pepcid were given. Patient denies any recent new medications or detergents or fragments or insect bite. Patient is afebrile and will be admitted for further observation. On exam patient has no stridor no wheezing no tongue swelling.    Hospital Course:  Admitted to medsurg. Started on steroids, h2, and h1 blockers, had several relapses in symptoms requiring continued hospitalization. Urticaria without respiratory symptoms. By discharge, rash much better. Will need outpt referral to allergist  Procedures:  none  Consultations:  none  Discharge Exam: Filed Vitals:   09/03/14 0539  BP: 117/63  Pulse: 75  Temp: 98 F (36.7 C)  Resp: 17     General: comfortable Cardiovascular: RRR Respiratory: CTA  Discharge Instructions   Discharge Instructions    Activity as tolerated - No restrictions    Complete by:  As directed           Current Discharge Medication List    START taking these medications   Details  EPINEPHrine (ADRENALIN) 1 MG/ML injection Inject 1 mL (1 mg total) into the muscle once. For throat closing/wheezing Qty: 1 mL, Refills: 0    hydrOXYzine (ATARAX/VISTARIL) 25 MG tablet Take 1 tablet (25 mg total) by mouth every 6 (six) hours. For itching/rash Qty: 30 tablet, Refills: 1    predniSONE (DELTASONE) 20 MG tablet Take 3 tablets (60 mg total) by mouth daily with breakfast. Until gone Qty: 21 tablet, Refills: 0      CONTINUE these medications which have CHANGED   Details  famotidine (PEPCID) 40 MG tablet Take 1 tablet (40 mg total) by mouth 2 (two) times daily. Until gone Qty: 28 tablet, Refills: 0      CONTINUE these medications which have NOT CHANGED   Details  cetirizine (ZYRTEC) 10 MG tablet Take 10 mg by mouth daily.      STOP taking these medications     calcium carbonate (TUMS - DOSED IN MG ELEMENTAL CALCIUM) 500 MG chewable tablet      doxycycline (VIBRA-TABS) 100 MG tablet        No Known Allergies Follow-up Information    Follow up with Manon Hilding, MD.   Specialty:  Family Medicine   Why:  As needed   Contact information:   [place] [address]  [phone]       Follow up with Mosetta Anis, MD.   Specialty:  Allergy   Contact information:   [address] [place] [address] [phone]        The results of significant diagnostics from this hospitalization (including imaging, microbiology, ancillary and laboratory) are listed below for reference.    Significant Diagnostic Studies: No results found.  Microbiology: Recent Results (from the past 240 hour(s))  MRSA PCR Screening     Status: None   Collection Time: 08/30/14   9:53 AM  Result Value Ref Range Status   MRSA by PCR NEGATIVE NEGATIVE Final    Comment:        The GeneXpert MRSA Assay (FDA approved for NASAL specimens only), is one component of a comprehensive MRSA colonization surveillance program. It is not intended to diagnose MRSA infection nor to guide or monitor treatment for MRSA infections.      Labs: Basic Metabolic Panel:  Recent Labs Lab 08/30/14 09/01/14 0307  NA 140 141  K 3.6 3.8  CL 103 104  CO2 25 28  GLUCOSE 85 154*  BUN 15 18  CREATININE 0.80 0.73  CALCIUM 9.2 9.2   Liver Function Tests:  Recent Labs Lab 08/30/14  AST 19  ALT 15  ALKPHOS 40  BILITOT 0.5  PROT 7.5  ALBUMIN 4.4   No results for input(s): LIPASE, AMYLASE in the last 168 hours. No results for input(s): AMMONIA in the last 168 hours. CBC:  Recent Labs Lab 08/30/14 09/01/14 0307  WBC 8.8 16.6*  NEUTROABS 4.7  --   HGB 13.4 13.7  HCT 39.3 39.8  MCV 92.5 92.6  PLT 291 277   Cardiac Enzymes: No results for input(s): CKTOTAL, CKMB, CKMBINDEX, TROPONINI in the last 168 hours. BNP: BNP (last 3 results) No results for input(s): BNP in the last 8760 hours.  ProBNP (last 3 results) No results for input(s): PROBNP in the last 8760 hours.  CBG:  Recent Labs Lab 08/30/14 1003  GLUCAP 124*       Signed:  Lavina L  [facility] 09/03/2014, 11:03 AM

## 2015-02-05 ENCOUNTER — Ambulatory Visit (INDEPENDENT_AMBULATORY_CARE_PROVIDER_SITE_OTHER): Payer: BLUE CROSS/BLUE SHIELD | Admitting: Allergy and Immunology

## 2015-02-05 ENCOUNTER — Encounter: Payer: Self-pay | Admitting: Allergy and Immunology

## 2015-02-05 VITALS — BP 128/82 | HR 72 | Temp 98.1°F | Resp 16

## 2015-02-05 DIAGNOSIS — L509 Urticaria, unspecified: Secondary | ICD-10-CM

## 2015-02-05 DIAGNOSIS — T7840XD Allergy, unspecified, subsequent encounter: Secondary | ICD-10-CM

## 2015-02-05 NOTE — Progress Notes (Signed)
FOLLOW UP NOTE  RE: Kyren Vaux MRN: [id number] DOB: Apr 17, 1965 [facility] [place] [address] Date of Office Visit: 02/05/2015  Subjective:  Nimisha Rathel is a 49 y.o. female who presents today for Allergy Testing   HPI: Djuna returns to the office in follow-up of allergic rhinitis and concern for food allergy.  Since her last visit in July she is eating chicken, [place] and plantar without difficulty.  She denies any recurring episodes of rash, hives, skin concerns or allergic reactions.  There may have been one episode of a rash, possible hive that her arm and neck.  When she had a here pruritic hairspray exposure that she had previously been avoiding.  There were no generalized concerns.  No GI symptoms.  In general, GI or respiratory symptoms are persisting difficulty once she discontinued the hairspray use.  She is interested in a repeat of the alpha gal test and further management based on the results.  We discussed previously that her severe episode may have resulted in negative levels less than a month from her episode with hospitalization in the intensive care unit.  No other new medical problems acute care or emergency care visits recurring upper or lower symptoms.  In review skin testing dated June 2016 was negative for beef, pork, lamb and [place] and labs dated June 2016, negative for alpha gal, beef, lamb, pork, tomato, chicken and [place] with a total IgE of 56kU/L.  Current Medications: 1.  Zyrtec 10 mg once daily. 2.  EpiPen/Benadryl as needed. 3.  Famotidine as needed.  Drug Allergies: No Known Allergies  Objective:   Filed Vitals:   02/05/15 1603  BP: 128/82  Pulse: 72  Temp: 98.1 F (36.7 C)  Resp: 16   Physical Exam  Constitutional: She is well-developed, well-nourished, and in no distress.  HENT:  Head: Atraumatic.  Right Ear: Tympanic membrane and ear canal normal.   Left Ear: Tympanic membrane and ear canal normal.  Nose: Mucosal edema present. No rhinorrhea. No epistaxis.  Mouth/Throat: Oropharynx is clear and moist and mucous membranes are normal. No oropharyngeal exudate, posterior oropharyngeal edema or posterior oropharyngeal erythema.  Neck: Neck supple.  Cardiovascular: Normal rate, S1 normal and S2 normal.   No murmur heard. Pulmonary/Chest: Effort normal. She has no wheezes. She has no rhonchi. She has no rales.  Lymphadenopathy:    She has no cervical adenopathy.    Assessment:   1. Hives   2. Allergic reaction, subsequent encounter unclear etiology.   3.     Allergic rhinitis. Plan:  1.  Kiri will obtain selected labs at [facility] alpha gal with beef, pork and lamb specific IgE and tryptase level. 2.  Ashya will continue with meat avoidance as previously, emergency action plan in place--EpiPen and Benadryl as needed. 3.  We will review lab results over the phone and assess ability for an office meat challenge. 4.  Zyrtec 10 mg once daily for recurring nasal symptoms. 5.  Saline nasal lavage each evening in particular during this fluctuant weather pattern.    Kendrew Paci M. Ishmael Holter, MD  cc: Consuello Masse, MD

## 2015-02-07 LAB — TRYPTASE: Tryptase: 6.1 ug/L (ref <11)

## 2015-02-12 LAB — ALPHA-GAL PANEL
Allergen, Mutton, f88: <0.1 kU/L
Allergen, Pork, f26: <0.1 kU/L
Beef: <0.1 kU/L
Galactose-alpha-1,3-galactose IgE: <0.1 kU/L (ref <0.35)

## 2015-02-19 ENCOUNTER — Telehealth: Payer: Self-pay | Admitting: Allergy and Immunology

## 2015-02-19 NOTE — Telephone Encounter (Signed)
Phone call to patient to review results.  Only answering machine left message of negative results though continue with avoidance and emergency action plan and we can review possible in office challenge.

## 2015-07-29 ENCOUNTER — Encounter: Payer: Self-pay | Admitting: Allergy and Immunology

## 2015-07-29 ENCOUNTER — Ambulatory Visit (INDEPENDENT_AMBULATORY_CARE_PROVIDER_SITE_OTHER): Payer: BLUE CROSS/BLUE SHIELD | Admitting: Allergy and Immunology

## 2015-07-29 VITALS — BP 130/70 | HR 76 | Temp 98.6°F | Resp 16

## 2015-07-29 DIAGNOSIS — K2970 Gastritis, unspecified, without bleeding: Secondary | ICD-10-CM

## 2015-07-29 DIAGNOSIS — J3089 Other allergic rhinitis: Secondary | ICD-10-CM | POA: Diagnosis not present

## 2015-07-29 DIAGNOSIS — L509 Urticaria, unspecified: Secondary | ICD-10-CM | POA: Diagnosis not present

## 2015-07-29 DIAGNOSIS — T783XXA Angioneurotic edema, initial encounter: Secondary | ICD-10-CM

## 2015-07-29 MED ORDER — LEVOCETIRIZINE DIHYDROCHLORIDE 5 MG PO TABS: 5.0000 mg | ORAL_TABLET | Freq: Every evening | ORAL | Status: DC

## 2015-07-29 NOTE — Progress Notes (Signed)
Follow-up Note  RE: Holly Allison MRN: [id number] DOB: 11/15/1965 Date of Office Visit: 07/29/2015  Primary care provider: Manon Hilding, MD Referring provider: Manon Hilding, MD  History of present illness: HPI Comments: Holly Allison is a 50 y.o. female history of urticaria and allergic rhinitis who presents today for follow up. Over the past 5 weeks, Holly Allison experienced recurrent episodes of hives. Typical distribution includes the neck and arms.  The lesions are described as erythematous, raised, and pruritic.  Individual hives last less than 24 hours without leaving residual pigmentation or bruising. She does not experience concomitant cardiopulmonary or GI symptoms. She Allison not experienced unexpected weight loss, recurrent fevers or drenching night sweats. No specific medication, food or environmental triggers have been identified. The symptoms do not correlate with NSAIDs but seem to be exacerbated by emotional stress. She did not have signs or symptoms of infection at the time of symptom onset. Holly Allison Allison tried to control symptoms with OTC antihistamines which have offered inadequate relief of symptoms. She initially experienced urticaria in 2014, and again in 2016.  She Allison experienced associated angioedema of the ears and eyelids on 2 occasions.  Her allergic rhinitis symptoms are well controlled with cetirizine.     Assessment and plan: Urticaria Unclear etiology. Skin tests to select food allergens were negative when tested in June 2016. We will rule out potential etiologies with labs. For symptom relief, patient is to take oral antihistamines as directed.  The following labs have been ordered: FCeRI antibody, TSH, anti-thyroglobulin antibody, thyroid peroxidase antibody, tryptase, urea breath test, CBC, CMP, ESR, ANA, and galactose-alpha-1,3-galactose IgE level.  The patient will be called with further recommendations after lab results have returned.  Instructions have  been provided and discussed for H1/H2 receptor blockade with titration to find lowest effective dose.  A prescription Allison been provided for levocetirizine, 5 mg daily as needed.  A journal is to be kept recording any foods eaten, beverages consumed, medications taken within a 6 hour period prior to the onset of symptoms, as well as record activities being performed, and environmental conditions. For any symptoms concerning for anaphylaxis, epinephrine is to be administered and 911 is to be called immediately.  Angioedema  Associated angioedema occurs in up to 50% of patients with chronic urticaria.  Treatment/diagnostic plan as outlined above.  Allergic rhinitis  Continue appropriate allergen avoidance measures.  A prescription Allison been provided for levocetirizine (as above).  Continue fluticasone nasal spray as needed.    Meds ordered this encounter  Medications  . levocetirizine (XYZAL) 5 MG tablet    Sig: Take 1 tablet (5 mg total) by mouth every evening.    Dispense:  30 tablet    Refill:  5       Physical examination: Blood pressure 130/70, pulse 76, temperature 98.6 F (37 C), temperature source Oral, resp. rate 16.  General: Alert, interactive, in no acute distress. HEENT: TMs pearly gray, turbinates mildly edematous without discharge, post-pharynx mildly erythematous. Neck: Supple without lymphadenopathy. Lungs: Clear to auscultation without wheezing, rhonchi or rales. CV: Normal S1, S2 without murmurs. Skin: Scattered erythematous urticarial type lesions primarily located forearms bilaterally and posterior neck , nonvesicular.  The following portions of the patient's history were reviewed and updated as appropriate: allergies, current medications, past family history, past medical history, past social history, past surgical history and problem list.    Medication List       This list is accurate as of: 07/29/15  1:06  PM.  Always use your most recent med list.                 BLACK COHOSH HOT FLASH RELIEF PO  Take 2 tablets by mouth daily.     cetirizine 10 MG tablet  Commonly known as:  ZYRTEC  Take 10 mg by mouth daily.     EPINEPHrine 1 MG/ML injection  Commonly known as:  ADRENALIN  Inject 1 mL (1 mg total) into the muscle once. For throat closing/wheezing     famotidine 40 MG tablet  Commonly known as:  PEPCID  Take 1 tablet (40 mg total) by mouth 2 (two) times daily. Until gone     levocetirizine 5 MG tablet  Commonly known as:  XYZAL  Take 1 tablet (5 mg total) by mouth every evening.        No Known Allergies  I appreciate the opportunity to take part in this Lasean's care. Please do not hesitate to contact me with questions.  Sincerely,   R. Edgar Frisk, MD

## 2015-07-29 NOTE — Patient Instructions (Addendum)
Urticaria Unclear etiology. Skin tests to select food allergens were negative when tested in June 2016. We will rule out potential etiologies with labs. For symptom relief, patient is to take oral antihistamines as directed.  The following labs have been ordered: FCeRI antibody, TSH, anti-thyroglobulin antibody, thyroid peroxidase antibody, tryptase, urea breath test, CBC, CMP, ESR, ANA, and galactose-alpha-1,3-galactose IgE level.  The patient will be called with further recommendations after lab results have returned.  Instructions have been provided and discussed for H1/H2 receptor blockade with titration to find lowest effective dose.  A prescription has been provided for levocetirizine, 5 mg daily as needed.  A journal is to be kept recording any foods eaten, beverages consumed, medications taken within a 6 hour period prior to the onset of symptoms, as well as record activities being performed, and environmental conditions. For any symptoms concerning for anaphylaxis, epinephrine is to be administered and 911 is to be called immediately.  Angioedema  Associated angioedema occurs in up to 50% of patients with chronic urticaria.  Treatment/diagnostic plan as outlined above.  Allergic rhinitis  Continue appropriate allergen avoidance measures.  A prescription has been provided for levocetirizine (as above).  Continue fluticasone nasal spray as needed.     Return in about 6 weeks (around 09/09/2015), or if symptoms worsen or fail to improve.  Urticaria (Hives)  . Levocetirizine (Xyzal) 5 mg in morning and Cetirizine (Zyrtec) 53m at night and ranitidine (Zantac) 150 mg twice a day. If no symptoms for 7-14 days then decrease to. . Levocetirizine (Xyzal) 5 mg in morning and Cetirizine (Zyrtec) 157mat night and ranitidine (Zantac) 150 mg once a day.  If no symptoms for 7-14 days then decrease to. . Levocetirizine (Xyzal) 5 mg in morning and Cetirizine (Zyrtec) 1037mt night.  If no  symptoms for 7-14 days then decrease to. . Levocetirizine (Xyzal) 5 mg once a day.  May use Benadryl (diphenhydramine) as needed for breakthrough symptoms       If symptoms return, then step up dosage

## 2015-07-29 NOTE — Assessment & Plan Note (Signed)
   Continue appropriate allergen avoidance measures.  A prescription has been provided for levocetirizine (as above).  Continue fluticasone nasal spray as needed.

## 2015-07-29 NOTE — Assessment & Plan Note (Signed)
Associated angioedema occurs in up to 50% of patients with chronic urticaria.  Treatment/diagnostic plan as outlined above. 

## 2015-07-29 NOTE — Assessment & Plan Note (Addendum)
Unclear etiology. Skin tests to select food allergens were negative when tested in June 2016. We will rule out potential etiologies with labs. For symptom relief, patient is to take oral antihistamines as directed.  The following labs have been ordered: FCeRI antibody, TSH, anti-thyroglobulin antibody, thyroid peroxidase antibody, tryptase, urea breath test, CBC, CMP, ESR, ANA, and galactose-alpha-1,3-galactose IgE level.  The patient will be called with further recommendations after lab results have returned.  Instructions have been provided and discussed for H1/H2 receptor blockade with titration to find lowest effective dose.  A prescription has been provided for levocetirizine, 5 mg daily as needed.  A journal is to be kept recording any foods eaten, beverages consumed, medications taken within a 6 hour period prior to the onset of symptoms, as well as record activities being performed, and environmental conditions. For any symptoms concerning for anaphylaxis, epinephrine is to be administered and 911 is to be called immediately.

## 2015-07-30 LAB — CBC WITH DIFFERENTIAL/PLATELET
Basophils Absolute: 0 cells/uL (ref 0–200)
Basophils Relative: 0 %
Eosinophils Absolute: 122 cells/uL (ref 15–500)
Eosinophils Relative: 2 %
HCT: 38.5 % (ref 35.0–45.0)
Hemoglobin: 12.9 g/dL (ref 11.7–15.5)
Lymphocytes Relative: 39 %
Lymphs Abs: 2379 cells/uL (ref 850–3900)
MCH: 30.5 pg (ref 27.0–33.0)
MCHC: 33.5 g/dL (ref 32.0–36.0)
MCV: 91 fL (ref 80.0–100.0)
MPV: 10 fL (ref 7.5–12.5)
Monocytes Absolute: 366 cells/uL (ref 200–950)
Monocytes Relative: 6 %
Neutro Abs: 3233 cells/uL (ref 1500–7800)
Neutrophils Relative %: 53 %
Platelets: 316 10*3/uL (ref 140–400)
RBC: 4.23 MIL/uL (ref 3.80–5.10)
RDW: 13.3 % (ref 11.0–15.0)
WBC: 6.1 10*3/uL (ref 3.8–10.8)

## 2015-07-30 LAB — COMPREHENSIVE METABOLIC PANEL
ALT: 14 U/L (ref 6–29)
AST: 15 U/L (ref 10–35)
Albumin: 4.4 g/dL (ref 3.6–5.1)
Alkaline Phosphatase: 47 U/L (ref 33–115)
BUN: 10 mg/dL (ref 7–25)
CO2: 30 mmol/L (ref 20–31)
Calcium: 9.4 mg/dL (ref 8.6–10.2)
Chloride: 104 mmol/L (ref 98–110)
Creat: 0.83 mg/dL (ref 0.50–1.10)
Glucose, Bld: 128 mg/dL — ABNORMAL HIGH (ref 65–99)
Potassium: 3.9 mmol/L (ref 3.5–5.3)
Sodium: 143 mmol/L (ref 135–146)
Total Bilirubin: 0.5 mg/dL (ref 0.2–1.2)
Total Protein: 6.9 g/dL (ref 6.1–8.1)

## 2015-07-30 LAB — H. PYLORI BREATH TEST: H. pylori Breath Test: NOT DETECTED

## 2015-07-30 LAB — ANA, IFA COMPREHENSIVE PANEL
Anti Nuclear Antibody(ANA): NEGATIVE
ENA SM Ab Ser-aCnc: <1
SM/RNP: <1
SSA (Ro) (ENA) Antibody, IgG: <1
SSB (La) (ENA) Antibody, IgG: <1
Scleroderma (Scl-70) (ENA) Antibody, IgG: <1
ds DNA Ab: <1 IU/mL

## 2015-07-30 LAB — RIBOSOMAL P PROTEIN AB: Ribosomal P Protein Ab: <1

## 2015-07-30 LAB — CENTROMERE ANTIBODIES: Centromere Ab Screen: <1

## 2015-07-30 LAB — SEDIMENTATION RATE: Sed Rate: 7 mm/hr (ref 0–20)

## 2015-08-01 LAB — ALPHA-GAL PANEL
Allergen, Mutton, f88: <0.1 kU/L
Allergen, Pork, f26: <0.1 kU/L
Beef: <0.1 kU/L
Galactose-alpha-1,3-galactose IgE: <0.1 kU/L (ref <0.35)

## 2015-08-01 LAB — TRYPTASE: Tryptase: 5.2 ug/L (ref <11)

## 2015-08-06 LAB — CP CHRONIC URTICARIA INDEX PANEL
Histamine Release: <16 % (ref <16)
TSH: 1.78 mIU/L
Thyroglobulin Ab: 45 IU/mL — ABNORMAL HIGH (ref <2)
Thyroperoxidase Ab SerPl-aCnc: 44 IU/mL — ABNORMAL HIGH (ref <9)

## 2015-08-12 ENCOUNTER — Telehealth: Payer: Self-pay

## 2015-08-12 NOTE — Telephone Encounter (Signed)
PATIENT ADVISED OF RESULTS AND CAN FOLLOWUP AS NEEDED FOR UNCONTROLLED SYMPTOMS

## 2015-08-12 NOTE — Telephone Encounter (Signed)
-----   Message from Adelina Mings, MD sent at 08/07/2015  1:37 PM EDT ----- The patient's results indicate autoimmune urticaria.  There is no cure for this but it has a waxing and waning course. Symptoms are suppressed with nonsedating antihistamines.  Continue H1/H2 receptor blockade, titrating to the lowest effective dose necessary to suppress urticaria. Please forward result to the patient's PCP.

## 2015-08-12 NOTE — Telephone Encounter (Signed)
Left message for patient to call back  

## 2016-02-12 ENCOUNTER — Ambulatory Visit (INDEPENDENT_AMBULATORY_CARE_PROVIDER_SITE_OTHER): Payer: BLUE CROSS/BLUE SHIELD | Admitting: Otolaryngology

## 2016-02-12 DIAGNOSIS — K149 Disease of tongue, unspecified: Secondary | ICD-10-CM

## 2016-02-18 ENCOUNTER — Telehealth: Payer: Self-pay | Admitting: Allergy and Immunology

## 2016-02-18 NOTE — Telephone Encounter (Signed)
Informed patient of Dr. Bobbitt's note. 

## 2016-02-18 NOTE — Telephone Encounter (Signed)
She has a pulled muscle in her back and is trying not to take NSAIDS. She thought that you told her to not take them as they would make her tongue swell. She is taking Xyzol and Zantac. She is wanting to speak with you about the meds and if she should stay off NSAIDS. She is having a lot of back pain but wants to follow your instructions.

## 2016-02-18 NOTE — Telephone Encounter (Signed)
Try Tylenol (acetaminophen) as this pain reliever should not trigger symptoms.

## 2016-04-14 DIAGNOSIS — Z1211 Encounter for screening for malignant neoplasm of colon: Secondary | ICD-10-CM | POA: Diagnosis not present

## 2016-04-14 DIAGNOSIS — Z1212 Encounter for screening for malignant neoplasm of rectum: Secondary | ICD-10-CM | POA: Diagnosis not present

## 2016-09-13 DIAGNOSIS — Z0000 Encounter for general adult medical examination without abnormal findings: Secondary | ICD-10-CM | POA: Diagnosis not present

## 2016-10-19 DIAGNOSIS — H5203 Hypermetropia, bilateral: Secondary | ICD-10-CM | POA: Diagnosis not present

## 2016-10-19 DIAGNOSIS — H04123 Dry eye syndrome of bilateral lacrimal glands: Secondary | ICD-10-CM | POA: Diagnosis not present

## 2016-10-28 ENCOUNTER — Encounter: Payer: Self-pay | Admitting: *Deleted

## 2016-10-29 ENCOUNTER — Ambulatory Visit (INDEPENDENT_AMBULATORY_CARE_PROVIDER_SITE_OTHER): Payer: BLUE CROSS/BLUE SHIELD | Admitting: Cardiology

## 2016-10-29 ENCOUNTER — Encounter: Payer: Self-pay | Admitting: Cardiology

## 2016-10-29 VITALS — BP 143/87 | HR 88 | Ht 64.0 in | Wt 171.0 lb

## 2016-10-29 DIAGNOSIS — R011 Cardiac murmur, unspecified: Secondary | ICD-10-CM

## 2016-10-29 MED ORDER — METOPROLOL TARTRATE 25 MG PO TABS: ORAL_TABLET | ORAL | 1 refills | Status: DC

## 2016-10-29 NOTE — Progress Notes (Addendum)
Clinical Summary Ms. Holly Allison is a 51 y.o.female seen as new patient, referred by Dr Quintin Alto for palpitations.    1. Palpitations - symptoms started since starting menopause about 5 years ago - typically symptoms off and on, short duration - recently has been having increasing symptoms. - over the last week, ongoing palpitaitons. +fatigue. Can have a mild discomfort associated with it as well - decaf coffee only, cut back on tea recently, no sodas, no energy drinks. No EtoH  - can have some DOE with activities, though sedentary lifestyle. - had been walking regularly.  - can have some LE edema at times  CAD risk factors: maternal uncle MI late 41s    [place] Patient denies any PMH  No Known Allergies   Current Outpatient Prescriptions  Medication Sig Dispense Refill  . cetirizine (ZYRTEC) 10 MG tablet Take 10 mg by mouth daily.    Marland Kitchen EPINEPHrine (ADRENALIN) 1 MG/ML injection Inject 1 mL (1 mg total) into the muscle once. For throat closing/wheezing 1 mL 0  . ESTROGEL 0.75 MG/1.25 GM (0.06%) topical gel   2  . ranitidine (ZANTAC) 150 MG capsule Take 150 mg by mouth 2 (two) times daily.    . metoprolol tartrate (LOPRESSOR) 25 MG tablet TAKE 1 TABLET TWICE DAILY AS NEEDED FOR PALPITATIONS 180 tablet 1   No current facility-administered medications for this visit.      Past Surgical History:  Procedure Laterality Date  . CESAREAN SECTION    . TONSILLECTOMY       No Known Allergies    Family History  Problem Relation Age of Onset  . Urticaria Neg Hx      Social History Ms. Billick reports that she has never smoked. She has never used smokeless tobacco. Ms. Mcenery reports that she does not drink alcohol.   Review of Systems CONSTITUTIONAL: No weight loss, fever, chills, weakness or fatigue.  HEENT: Eyes: No visual loss, blurred vision, double vision or yellow sclerae.No hearing loss, sneezing, congestion, runny nose or sore throat.  SKIN: No rash or  itching.  CARDIOVASCULAR: per hpi RESPIRATORY: No shortness of breath, cough or sputum.  GASTROINTESTINAL: No anorexia, nausea, vomiting or diarrhea. No abdominal pain or blood.  GENITOURINARY: No burning on urination, no polyuria NEUROLOGICAL: No headache, dizziness, syncope, paralysis, ataxia, numbness or tingling in the extremities. No change in bowel or bladder control.  MUSCULOSKELETAL: No muscle, back pain, joint pain or stiffness.  LYMPHATICS: No enlarged nodes. No history of splenectomy.  PSYCHIATRIC: No history of depression or anxiety.  ENDOCRINOLOGIC: No reports of sweating, cold or heat intolerance. No polyuria or polydipsia.  Marland Kitchen   Physical Examination Vitals:   10/29/16 1304 10/29/16 1308  BP: 135/84 (!) 143/87  Pulse: 87 88   Filed Weights   10/29/16 1304  Weight: 171 lb (77.6 kg)    Gen: resting comfortably, no acute distress HEENT: no scleral icterus, pupils equal round and reactive, no palptable cervical adenopathy,  CV: RRR, no m/r/g, no jvd Resp: Clear to auscultation bilaterally GI: abdomen is soft, non-tender, non-distended, normal bowel sounds, no hepatosplenomegaly MSK: extremities are warm, no edema.  Skin: warm, no rash Neuro:  no focal deficits Psych: appropriate affect    Assessment and Plan  1. Palpitations - we will obtain cardiac monitor to evaluate for arrhythmias - start prn lopressor - obtain 48 hr holter   2. DOE - we will obtain echo to evaluate for cardiac dysfunction - her baseline ekg has  some lateral precordial TWI and depression, depending on echo may consider stress test.   Arnoldo Lenis, M.D.

## 2016-10-29 NOTE — Patient Instructions (Signed)
Your physician recommends that you schedule a follow-up appointment in: TO BE DETERMINED WITH DR [facility]  Your physician has recommended you make the following change in your medication:   START METOPROLOL 25 MG TWICE DAILY AS NEEDED FOR PALPITATIONS   Your physician has requested that you have an echocardiogram. Echocardiography is a painless test that uses sound waves to create images of your heart. It provides your doctor with information about the size and shape of your heart and how well your heart's chambers and valves are working. This procedure takes approximately one hour. There are no restrictions for this procedure.   Thank you for choosing [facility]!!

## 2016-11-01 ENCOUNTER — Encounter (INDEPENDENT_AMBULATORY_CARE_PROVIDER_SITE_OTHER): Payer: BLUE CROSS/BLUE SHIELD

## 2016-11-01 ENCOUNTER — Telehealth: Payer: Self-pay

## 2016-11-01 DIAGNOSIS — R002 Palpitations: Secondary | ICD-10-CM

## 2016-11-01 NOTE — Telephone Encounter (Signed)
-----   Message from Arnoldo Lenis, MD sent at 11/01/2016  9:27 AM EDT ----- Can we set this patient up with a 48 hr holter monitor for palpitations  J BrancH MD

## 2016-11-01 NOTE — Telephone Encounter (Signed)
Patient notified needing holter monitor. Patient coming in today to have monitor placed.

## 2016-11-11 ENCOUNTER — Other Ambulatory Visit: Payer: Self-pay

## 2016-11-11 ENCOUNTER — Ambulatory Visit: Payer: BLUE CROSS/BLUE SHIELD | Admitting: Cardiovascular Disease

## 2016-11-11 DIAGNOSIS — I10 Essential (primary) hypertension: Secondary | ICD-10-CM

## 2016-11-18 ENCOUNTER — Ambulatory Visit (INDEPENDENT_AMBULATORY_CARE_PROVIDER_SITE_OTHER): Payer: BLUE CROSS/BLUE SHIELD

## 2016-11-18 ENCOUNTER — Other Ambulatory Visit: Payer: Self-pay

## 2016-11-18 DIAGNOSIS — R011 Cardiac murmur, unspecified: Secondary | ICD-10-CM | POA: Diagnosis not present

## 2016-11-23 ENCOUNTER — Telehealth: Payer: Self-pay | Admitting: *Deleted

## 2016-11-23 NOTE — Telephone Encounter (Signed)
-----   Message from Arnoldo Lenis, MD sent at 11/23/2016 12:22 PM EDT ----- Echo looks good, her heart function is normal. F/u 6 weeks to reevaluate symptoms, if ongoing or progressing may consider stress test at that time  J BrancH MD

## 2016-11-23 NOTE — Telephone Encounter (Signed)
Pt aware - routed to pcp - scheduled f/u

## 2016-11-24 ENCOUNTER — Telehealth: Payer: Self-pay | Admitting: Cardiology

## 2016-11-24 NOTE — Telephone Encounter (Signed)
Routed to Dr. Branch

## 2016-11-24 NOTE — Telephone Encounter (Signed)
Patient called to get monitor results

## 2016-12-01 NOTE — Telephone Encounter (Signed)
Pt aware - denies any symptoms at this time and says she has been taking Lopressor and feels like this is helping

## 2016-12-01 NOTE — Telephone Encounter (Signed)
She should f/u 4 months, earlier if recurrent symptoms  J Harlee Pursifull MD

## 2016-12-01 NOTE — Telephone Encounter (Signed)
Not sure if was resulted, looks like it was mistakingly sent to Dr Domenic Polite. Let patient know that monitor overall looked ok, showed just some occasional extra heart beats at times, no significant abnromal rhythms. Has she tried the lopressor, are her symptoms any better?    Zandra Abts MD

## 2016-12-01 NOTE — Telephone Encounter (Signed)
Pt already scheduled for October and would like to keep that appt for now - this ok?

## 2016-12-02 DIAGNOSIS — R002 Palpitations: Secondary | ICD-10-CM | POA: Diagnosis not present

## 2016-12-02 DIAGNOSIS — R5383 Other fatigue: Secondary | ICD-10-CM | POA: Diagnosis not present

## 2016-12-02 NOTE — Telephone Encounter (Signed)
That is fine   J Ricquel Foulk MD 

## 2016-12-07 DIAGNOSIS — N939 Abnormal uterine and vaginal bleeding, unspecified: Secondary | ICD-10-CM | POA: Diagnosis not present

## 2016-12-07 DIAGNOSIS — Z6830 Body mass index (BMI) 30.0-30.9, adult: Secondary | ICD-10-CM | POA: Diagnosis not present

## 2016-12-21 DIAGNOSIS — N939 Abnormal uterine and vaginal bleeding, unspecified: Secondary | ICD-10-CM | POA: Diagnosis not present

## 2016-12-21 DIAGNOSIS — Z6830 Body mass index (BMI) 30.0-30.9, adult: Secondary | ICD-10-CM | POA: Diagnosis not present

## 2017-01-11 ENCOUNTER — Ambulatory Visit (INDEPENDENT_AMBULATORY_CARE_PROVIDER_SITE_OTHER): Payer: BLUE CROSS/BLUE SHIELD | Admitting: Cardiology

## 2017-01-11 ENCOUNTER — Encounter: Payer: Self-pay | Admitting: Cardiology

## 2017-01-11 VITALS — BP 116/64 | HR 75 | Ht 64.0 in | Wt 182.0 lb

## 2017-01-11 DIAGNOSIS — R002 Palpitations: Secondary | ICD-10-CM

## 2017-01-11 NOTE — Patient Instructions (Signed)

## 2017-01-11 NOTE — Progress Notes (Signed)
Clinical Summary Holly Allison is a 51 y.o.female seen today for follow up of the following medical problems.   1. Palpitations - symptoms started since starting menopause about 5 years ago - typically symptoms off and on, short duration - recently has been having increasing symptoms. - over the last week, ongoing palpitaitons. +fatigue. Can have a mild discomfort associated with it as well - decaf coffee only, cut back on tea recently, no sodas, no energy drinks. No EtoH     10/2016 holter rare PACs and PVCs, no significant arrhythmias. Takes 1/2 of loressor on average 2 times a day. Symptoms responding well 11/2016 echo: LVEF 60-65%, normal diastoilc function  No past medical history on file.   No Known Allergies   Current Outpatient Prescriptions  Medication Sig Dispense Refill  . cetirizine (ZYRTEC) 10 MG tablet Take 10 mg by mouth daily.    Marland Kitchen EPINEPHrine (ADRENALIN) 1 MG/ML injection Inject 1 mL (1 mg total) into the muscle once. For throat closing/wheezing 1 mL 0  . ESTROGEL 0.75 MG/1.25 GM (0.06%) topical gel   2  . metoprolol tartrate (LOPRESSOR) 25 MG tablet TAKE 1 TABLET TWICE DAILY AS NEEDED FOR PALPITATIONS 180 tablet 1  . ranitidine (ZANTAC) 150 MG capsule Take 150 mg by mouth 2 (two) times daily.     No current facility-administered medications for this visit.      Past Surgical History:  Procedure Laterality Date  . CESAREAN SECTION    . TONSILLECTOMY       No Known Allergies    Family History  Problem Relation Age of Onset  . Urticaria Neg Hx      Social History Ms. Richardson reports that she has never smoked. She has never used smokeless tobacco. Ms. Reinhold reports that she does not drink alcohol.   Review of Systems CONSTITUTIONAL: No weight loss, fever, chills, weakness or fatigue.  HEENT: Eyes: No visual loss, blurred vision, double vision or yellow sclerae.No hearing loss, sneezing, congestion, runny nose or sore throat.  SKIN: No  rash or itching.  CARDIOVASCULAR: per hpi RESPIRATORY: No shortness of breath, cough or sputum.  GASTROINTESTINAL: No anorexia, nausea, vomiting or diarrhea. No abdominal pain or blood.  GENITOURINARY: No burning on urination, no polyuria NEUROLOGICAL: No headache, dizziness, syncope, paralysis, ataxia, numbness or tingling in the extremities. No change in bowel or bladder control.  MUSCULOSKELETAL: No muscle, back pain, joint pain or stiffness.  LYMPHATICS: No enlarged nodes. No history of splenectomy.  PSYCHIATRIC: No history of depression or anxiety.  ENDOCRINOLOGIC: No reports of sweating, cold or heat intolerance. No polyuria or polydipsia.  Marland Kitchen   Physical Examination Vitals:   01/11/17 1536  BP: 116/64  Pulse: 75  SpO2: 96%   Vitals:   01/11/17 1536  Weight: 182 lb (82.6 kg)  Height: 5\' 4"  (1.626 m)    Gen: resting comfortably, no acute distress HEENT: no scleral icterus, pupils equal round and reactive, no palptable cervical adenopathy,  CV: RRR, no m/rg, no jvd Resp: Clear to auscultation bilaterally GI: abdomen is soft, non-tender, non-distended, normal bowel sounds, no hepatosplenomegaly MSK: extremities are warm, no edema.  Skin: warm, no rash Neuro:  no focal deficits Psych: appropriate affect   Diagnostic Studies 10/2016 holter  Min HR 42, Max HR 141, Avg HR 74. 1.6 second sinus pause in early morning presumably while sleeping  Rare supraventricular ectopy in the form of isolated PACs  Rare ventriculcar ectopy in the setting of isolated PVCs  No  symptoms reported  Telemetry tracings show sinus rhythm with rare PACs and PVCs  No significant arrhythmias  11/2016 echo Study Conclusions  - Left ventricle: The cavity size was normal. Wall thickness was   normal. Systolic function was normal. The estimated ejection   fraction was in the range of 60% to 65%. Left ventricular   diastolic function parameters were normal.  Assessment and Plan  1.  Palpitations - monitor just with rare PACS and PVCs, no significant arrhtyhmias - continue prn lopressor.    F/u 6 months    Arnoldo Lenis, M.D.

## 2017-01-27 DIAGNOSIS — Z23 Encounter for immunization: Secondary | ICD-10-CM | POA: Diagnosis not present

## 2017-09-29 DIAGNOSIS — Z6828 Body mass index (BMI) 28.0-28.9, adult: Secondary | ICD-10-CM | POA: Diagnosis not present

## 2017-09-29 DIAGNOSIS — Z1211 Encounter for screening for malignant neoplasm of colon: Secondary | ICD-10-CM | POA: Diagnosis not present

## 2017-10-06 DIAGNOSIS — Z0000 Encounter for general adult medical examination without abnormal findings: Secondary | ICD-10-CM | POA: Diagnosis not present

## 2017-10-17 DIAGNOSIS — Z1231 Encounter for screening mammogram for malignant neoplasm of breast: Secondary | ICD-10-CM | POA: Diagnosis not present

## 2017-10-21 DIAGNOSIS — D124 Benign neoplasm of descending colon: Secondary | ICD-10-CM | POA: Diagnosis not present

## 2017-10-21 DIAGNOSIS — Z1211 Encounter for screening for malignant neoplasm of colon: Secondary | ICD-10-CM | POA: Diagnosis not present

## 2017-10-21 DIAGNOSIS — Z79899 Other long term (current) drug therapy: Secondary | ICD-10-CM | POA: Diagnosis not present

## 2017-11-10 DIAGNOSIS — Z6829 Body mass index (BMI) 29.0-29.9, adult: Secondary | ICD-10-CM | POA: Diagnosis not present

## 2017-11-10 DIAGNOSIS — D124 Benign neoplasm of descending colon: Secondary | ICD-10-CM | POA: Diagnosis not present

## 2018-01-17 DIAGNOSIS — Z23 Encounter for immunization: Secondary | ICD-10-CM | POA: Diagnosis not present

## 2018-05-01 DIAGNOSIS — Z23 Encounter for immunization: Secondary | ICD-10-CM | POA: Diagnosis not present

## 2018-06-07 DIAGNOSIS — J329 Chronic sinusitis, unspecified: Secondary | ICD-10-CM | POA: Diagnosis not present

## 2018-09-20 DIAGNOSIS — K5909 Other constipation: Secondary | ICD-10-CM | POA: Diagnosis not present

## 2018-09-20 DIAGNOSIS — K623 Rectal prolapse: Secondary | ICD-10-CM | POA: Diagnosis not present

## 2018-09-20 DIAGNOSIS — Z6830 Body mass index (BMI) 30.0-30.9, adult: Secondary | ICD-10-CM | POA: Diagnosis not present

## 2018-10-31 DIAGNOSIS — N951 Menopausal and female climacteric states: Secondary | ICD-10-CM | POA: Diagnosis not present

## 2018-10-31 DIAGNOSIS — Z6830 Body mass index (BMI) 30.0-30.9, adult: Secondary | ICD-10-CM | POA: Diagnosis not present

## 2018-10-31 DIAGNOSIS — K623 Rectal prolapse: Secondary | ICD-10-CM | POA: Diagnosis not present

## 2019-01-17 DIAGNOSIS — Z23 Encounter for immunization: Secondary | ICD-10-CM | POA: Diagnosis not present

## 2019-01-18 DIAGNOSIS — Z20828 Contact with and (suspected) exposure to other viral communicable diseases: Secondary | ICD-10-CM | POA: Diagnosis not present

## 2019-01-18 DIAGNOSIS — R509 Fever, unspecified: Secondary | ICD-10-CM | POA: Diagnosis not present

## 2019-03-19 DIAGNOSIS — R300 Dysuria: Secondary | ICD-10-CM | POA: Diagnosis not present

## 2019-04-02 DIAGNOSIS — Z209 Contact with and (suspected) exposure to unspecified communicable disease: Secondary | ICD-10-CM | POA: Diagnosis not present

## 2019-05-25 ENCOUNTER — Ambulatory Visit (INDEPENDENT_AMBULATORY_CARE_PROVIDER_SITE_OTHER): Payer: BC Managed Care – PPO | Admitting: Allergy & Immunology

## 2019-05-25 ENCOUNTER — Encounter: Payer: Self-pay | Admitting: Allergy & Immunology

## 2019-05-25 ENCOUNTER — Other Ambulatory Visit: Payer: Self-pay

## 2019-05-25 VITALS — BP 134/82 | HR 85 | Temp 97.8°F | Resp 18 | Ht 65.0 in | Wt 185.2 lb

## 2019-05-25 DIAGNOSIS — L501 Idiopathic urticaria: Secondary | ICD-10-CM | POA: Diagnosis not present

## 2019-05-25 DIAGNOSIS — L508 Other urticaria: Secondary | ICD-10-CM

## 2019-05-25 MED ORDER — OMALIZUMAB 150 MG ~~LOC~~ SOLR
300.0000 mg | SUBCUTANEOUS | Status: DC
  Administered 2019-05-25 – 2019-07-25 (×3): 300 mg via SUBCUTANEOUS

## 2019-05-25 MED ORDER — EPINEPHRINE 0.3 MG/0.3ML IJ SOAJ: 0.3000 mg | Freq: Once | INTRAMUSCULAR | 1 refills | Status: AC

## 2019-05-25 NOTE — Progress Notes (Signed)
NEW PATIENT  Date of Service/Encounter:  05/25/19  Referring provider: Manon Hilding, MD   Assessment:   Chronic urticaria - with extensive negative workup in the past   Initiating Xolair today  Plan/Recommendations:   1. Chronic urticaria - You have already had an excellent workup for chronic urticaria (hives) in the past. - Given the new onset joint pain in the hands, we are going get another ANA and inflammatory markers - We are also going to get repeat thyroid antibodies as well as a serum tryptase to rule out mast cell disease. - We will call you in 1-2 weeks with the results of the testing. - You might be able to get these done at [place] as well. - We are going to go ahead and start Xolair monthly. - This is an injectable that soaks up your allergy antibodies to prevent hives flares. - Sample given and Consent signed. [name] training and Anaphylaxis Management Plan provided. - Continue with the suppressive dosing of antihistamines for now.  AM: Xyzal + Pepcid  PM: Zyrtec + Pepcid - We can start weaning around the time of the next visit.  2. Return in about 3 months (around 08/22/2019). This can be an in-person, a virtual Webex or a telephone follow up visit.  Subjective:   Holly Allison is a 54 y.o. female presenting today for evaluation of  Chief Complaint  Patient presents with  . Urticaria    Unassociated. In random places. [place] has a history of the following: Patient Active Problem List   Diagnosis Date Noted  . Allergic rhinitis 07/29/2015  . Angioedema 07/29/2015  . Acute urticaria 08/31/2014  . Allergic reaction 08/30/2014  . Urticaria, acute 08/30/2014  . Urticaria 08/30/2014  . GERD (gastroesophageal reflux disease) 10/21/2012  . Other pancytopenia ([place]) 10/21/2012  . Hypotension, unspecified 10/20/2012  . Dehydration 10/20/2012  . 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 Leukopenia 10/19/2012  .  Thrombocytopenia ([place]) 10/19/2012  . Elevated transaminase level 10/19/2012  . Tachycardia 10/19/2012  . Hypokalemia 10/19/2012    History obtained from: chart review and patient.  Holly Allison was referred by Manon Hilding, MD.     Holly Allison is a 54 y.o. female presenting for an evaluation of chronic urticaria.  She has been followed by our practice for a number of years.  She was last seen by Dr. Ishmael Holter in October 2016. At that time, she was doing well with Zyrtec 10 mg daily and Benadryl and epinephrine as needed.  She had skin testing in June 2016 that was negative for beef, pork, lamb, and [place].  She had an alpha gal panel which was negative as well, including a negative IgE to tomato, chicken, and [place].  Her total IgE was 56.  She was then seen by Dr. Verlin Fester in April 2017.  At that time, she was continuing to have hives over 5 weeks.  He ordered an FC epsilon R1 antibody assay, thyroid studies, thyroid antibodies, serum tryptase, urea breath test, CBC, CMP, ESR, ANA, and alpha gal panel.  These were all completely normal.  She was started on levocetirizine 5 mg daily as needed.  Her EpiPen was also refilled.  She presents today to establish care once again.  She tells me that after her visit in 2017, her hives got better without the use of antihistamines.  However, over time they become more frequent again.  She is using cetirizine and Xyzal as well  as famotidine.  They have become especially more persistent over the last 6 months.  She is reporting some permanent skin changes.  She has had no fevers at all.  She did get a prednisone burst around 1 month ago, which resolved the hives.  However, they came right back after the prednisone was weaned.  She describes the hives as classic urticaria.  She also tells me that some of the hives feel like a "bee sting without the stinging".  She has never developed hives from a stinging insect and has no anaphylaxis from stinging insects.  She  otherwise has no atopic history. Otherwise, there is no history of other atopic diseases, including asthma, food allergies, drug allergies, environmental allergies, stinging insect allergies, eczema or contact dermatitis. There is no significant infectious history. Vaccinations are up to date.    Past Medical History: Patient Active Problem List   Diagnosis Date Noted  . Allergic rhinitis 07/29/2015  . Angioedema 07/29/2015  . Acute urticaria 08/31/2014  . Allergic reaction 08/30/2014  . Urticaria, acute 08/30/2014  . Urticaria 08/30/2014  . GERD (gastroesophageal reflux disease) 10/21/2012  . Other pancytopenia ([place]) 10/21/2012  . Hypotension, unspecified 10/20/2012  . Dehydration 10/20/2012  . 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 Leukopenia 10/19/2012  . Thrombocytopenia ([place]) 10/19/2012  . Elevated transaminase level 10/19/2012  . Tachycardia 10/19/2012  . Hypokalemia 10/19/2012    Medication List:  Allergies as of 05/25/2019   No Known Allergies     Medication List       Accurate as of May 25, 2019 11:59 PM. If you have any questions, ask your nurse or doctor.        STOP taking these medications   EPINEPHrine 1 MG/ML injection Commonly known as: ADRENALIN Stopped by: Valentina Shaggy, MD   metoprolol tartrate 25 MG tablet Commonly known as: LOPRESSOR Stopped by: Valentina Shaggy, MD   ranitidine 150 MG capsule Commonly known as: ZANTAC Stopped by: Valentina Shaggy, MD     TAKE these medications   cetirizine 10 MG tablet Commonly known as: ZYRTEC Take 10 mg by mouth daily.   EpiPen 2-Pak 0.3 mg/0.3 mL Soaj injection Generic drug: EPINEPHrine Inject 0.3 mg into the muscle as needed for anaphylaxis. What changed: Another medication with the same name was added. Make sure you understand how and when to take each. Changed by: Valentina Shaggy, MD   EPINEPHrine 0.3 mg/0.3 mL Soaj injection Commonly known as: EPI-PEN Inject 0.3 mLs (0.3 mg  total) into the muscle once for 1 dose. What changed: You were already taking a medication with the same name, and this prescription was added. Make sure you understand how and when to take each. Changed by: Valentina Shaggy, MD   Estrogel 0.75 MG/1.25 GM (0.06%) topical gel Generic drug: Estradiol   famotidine 40 MG tablet Commonly known as: PEPCID Take 40 mg by mouth 2 (two) times daily.   levocetirizine 5 MG tablet Commonly known as: XYZAL Take 5 mg by mouth daily.   levonorgestrel 20 MCG/24HR IUD Commonly known as: MIRENA 1 each by Intrauterine route once.       Birth History: non-contributory  Developmental History: non-contributory  Past Surgical History: Past Surgical History:  Procedure Laterality Date  . CESAREAN SECTION    . TONSILLECTOMY       Family History: Family History  Problem Relation Age of Onset  . Urticaria Neg Hx   . Allergic rhinitis Neg Hx   . Angioedema Neg Hx   .  Asthma Neg Hx   . Atopy Neg Hx   . Eczema Neg Hx   . Immunodeficiency Neg Hx      Social History: Holly Allison lives at home with her family.  They live in a house that is 55 years old.  There is hardwood in the main living areas and laminate flooring in the bedrooms.  They have a heat pump for heating and cooling.  There are 2 cats and 1 dog in the home.  There are no dust mite covers on the bedding.  There is no tobacco exposure.  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   Review of Systems  Constitutional: Negative.  Negative for fever, malaise/fatigue and weight loss.  HENT: Negative.  Negative for congestion, ear discharge and ear pain.   Eyes: Negative for pain, discharge and redness.  Respiratory: Negative for cough, sputum production, shortness of breath and wheezing.   Cardiovascular: Negative.  Negative for chest pain and palpitations.  Gastrointestinal: Negative for abdominal pain, constipation, diarrhea, heartburn,  nausea and vomiting.  Skin: Positive for itching and rash.  Neurological: Negative for dizziness and headaches.  Endo/Heme/Allergies: Negative for environmental allergies. Does not bruise/bleed easily.       Objective:   Blood pressure 134/82, pulse 85, temperature 97.8 F (36.6 C), temperature source Temporal, resp. rate 18, height 5' 5" (1.651 m), weight 185 lb 3.2 oz (84 kg), SpO2 96 %. Body mass index is 30.82 kg/m.   Physical Exam:   Physical Exam  Constitutional: She appears well-developed.  HENT:  Head: Normocephalic and atraumatic.  Right Ear: Tympanic membrane, external ear and ear canal normal. No drainage, swelling or tenderness. Tympanic membrane is not injected, not scarred, not erythematous, not retracted and not bulging.  Left Ear: Tympanic membrane, external ear and ear canal normal. No drainage, swelling or tenderness. Tympanic membrane is not injected, not scarred, not erythematous, not retracted and not bulging.  Nose: No mucosal edema, rhinorrhea, nasal deformity or septal deviation. No epistaxis. Right sinus exhibits no maxillary sinus tenderness and no frontal sinus tenderness. Left sinus exhibits no maxillary sinus tenderness and no frontal sinus tenderness.  Mouth/Throat: Uvula is midline and oropharynx is clear and moist. Mucous membranes are not pale and not dry.  Eyes: Pupils are equal, round, and reactive to light. Conjunctivae and EOM are normal. Right eye exhibits no chemosis and no discharge. Left eye exhibits no chemosis and no discharge. Right conjunctiva is not injected. Left conjunctiva is not injected.  Cardiovascular: Normal rate, regular rhythm and normal heart sounds.  Respiratory: Effort normal and breath sounds normal. No accessory muscle usage. No tachypnea. No respiratory distress. She has no wheezes. She has no rhonchi. She has no rales. She exhibits no tenderness.  GI: There is no abdominal tenderness. There is no rebound and no guarding.    Lymphadenopathy:       Head (right side): No submandibular, no tonsillar and no occipital adenopathy present.       Head (left side): No submandibular, no tonsillar and no occipital adenopathy present.    She has no cervical adenopathy.  Neurological: She is alert.  Skin: No abrasion, no petechiae and no rash noted. Rash is not papular, not vesicular and not urticarial. No erythema. No pallor.  She does have some hyperpigmented lesions on her bilateral arms. There are no distinct urticaria today but there are some excoriations. She does show me multiple pictures of classical appearing urticaria.  Psychiatric: She has a normal mood and affect.     Diagnostic studies: labs sent instead  Sample of Xolair 36m given today in clinic. Patient was monitored for one hour and tolerated this well. She did report some shortness of breath and a faint feeling around 10 minutes into the injection, but it turns out that she received word that her father had to undergo a catheterization procedure, which was clearly making her anxious. Her mother was with her father, but she was planning to go and relieve her mother after she left this appointment. Vitals remained absolutely stable. See flow sheet for details.     JSalvatore Marvel MD [facility]

## 2019-05-25 NOTE — Patient Instructions (Addendum)
1. Chronic urticaria - You have already had an excellent workup for chronic urticaria (hives) in the past. - Given the new onset joint pain in the hands, we are going get another ANA and inflammatory markers - We are also going to get repeat thyroid antibodies as well as a serum tryptase to rule out mast cell disease. - We will call you in 1-2 weeks with the results of the testing. - You might be able to get these done at [facility] as well. - We are going to go ahead and start Xolair monthly. - This is an injectable that soaks up your allergy antibodies to prevent hives flares. - Sample given and Consent signed. [name] training and Anaphylaxis Management Plan provided. - Continue with the suppressive dosing of antihistamines for now.  AM: Xyzal + Pepcid  PM: Zyrtec + Pepcid - We can start weaning around the time of the next visit.  2. Return in about 3 months (around 08/22/2019). This can be an in-person, a virtual Webex or a telephone follow up visit.   Please inform us of any Emergency Department visits, hospitalizations, or changes in symptoms. Call us before going to the ED for breathing or allergy symptoms since we might be able to fit you in for a sick visit. Feel free to contact us anytime with any questions, problems, or concerns.  It was a pleasure to meet you today!  Websites that have reliable patient information: 1. American Academy of Asthma, Allergy, and Immunology: www.aaaai.org 2. Food Allergy Research and Education (FARE): foodallergy.org 3. Mothers of Asthmatics: http://www.asthmacommunitynetwork.org 4. American College of Allergy, Asthma, and Immunology: www.acaai.org   COVID-19 Vaccine Information can be found at: ShippingScam.co.uk For questions related to vaccine distribution or appointments, please email [email or web address] or call [phone].     "Like" [name] on Facebook and Instagram for our latest  updates!        Make sure you are registered to vote! If you have moved or changed any of your contact information, you will need to get this updated before voting!  In some cases, you MAY be able to register to vote online: [email or web address]

## 2019-05-29 ENCOUNTER — Telehealth: Payer: Self-pay | Admitting: *Deleted

## 2019-05-29 NOTE — Telephone Encounter (Signed)
-----   Message from Valentina Shaggy, MD sent at 05/26/2019 11:12 AM EST ----- New start Xolair. Sample of 300mg  given in clinic today.

## 2019-05-29 NOTE — Telephone Encounter (Signed)
L/m for patient to contact me to advise approval, copay and buy and bill for Xolair. Patient already has appt for her next injections

## 2019-06-04 NOTE — Telephone Encounter (Signed)
Spoke to patient and advised approval, copay card and buy and bill for her Xolair

## 2019-06-04 NOTE — Telephone Encounter (Signed)
L/m for patient again to contact me ?

## 2019-06-22 ENCOUNTER — Ambulatory Visit: Payer: Self-pay

## 2019-06-26 DIAGNOSIS — L501 Idiopathic urticaria: Secondary | ICD-10-CM | POA: Diagnosis not present

## 2019-06-27 ENCOUNTER — Other Ambulatory Visit: Payer: Self-pay

## 2019-06-27 ENCOUNTER — Ambulatory Visit (INDEPENDENT_AMBULATORY_CARE_PROVIDER_SITE_OTHER): Payer: BC Managed Care – PPO

## 2019-06-27 DIAGNOSIS — L508 Other urticaria: Secondary | ICD-10-CM

## 2019-06-27 DIAGNOSIS — L501 Idiopathic urticaria: Secondary | ICD-10-CM

## 2019-07-24 DIAGNOSIS — L501 Idiopathic urticaria: Secondary | ICD-10-CM | POA: Diagnosis not present

## 2019-07-25 ENCOUNTER — Ambulatory Visit (INDEPENDENT_AMBULATORY_CARE_PROVIDER_SITE_OTHER): Payer: BC Managed Care – PPO

## 2019-07-25 ENCOUNTER — Other Ambulatory Visit: Payer: Self-pay

## 2019-07-25 DIAGNOSIS — L501 Idiopathic urticaria: Secondary | ICD-10-CM | POA: Diagnosis not present

## 2019-08-20 ENCOUNTER — Telehealth: Payer: Self-pay

## 2019-08-20 NOTE — Telephone Encounter (Signed)
Patient called to cx her appointment for her Xolair injection on Wednesday.  Patient states the injection is not helping her so she is discontinuing.  Please Advise.

## 2019-08-20 NOTE — Telephone Encounter (Signed)
That is fine if she wants to D/C.  She does have appt with Dr Ernst Bowler on Friday and may want to discuss with him

## 2019-08-22 ENCOUNTER — Ambulatory Visit: Payer: Self-pay

## 2019-08-22 NOTE — Telephone Encounter (Signed)
Looks like she has had 3 doses of Xolair.  We can discuss stopping it on Friday.   Salvatore Marvel, MD [facility] [place]

## 2019-08-24 ENCOUNTER — Ambulatory Visit: Payer: BC Managed Care – PPO | Admitting: Allergy & Immunology

## 2019-08-27 NOTE — Telephone Encounter (Signed)
Patient didn't come in for appt with Dr Ernst Bowler so I will move her to inactive and can restart later if she decides to

## 2019-09-15 DIAGNOSIS — R739 Hyperglycemia, unspecified: Secondary | ICD-10-CM | POA: Diagnosis not present

## 2019-09-15 DIAGNOSIS — Z0000 Encounter for general adult medical examination without abnormal findings: Secondary | ICD-10-CM | POA: Diagnosis not present

## 2019-09-15 DIAGNOSIS — E782 Mixed hyperlipidemia: Secondary | ICD-10-CM | POA: Diagnosis not present

## 2019-09-17 DIAGNOSIS — Z0000 Encounter for general adult medical examination without abnormal findings: Secondary | ICD-10-CM | POA: Diagnosis not present

## 2020-01-12 DIAGNOSIS — L509 Urticaria, unspecified: Secondary | ICD-10-CM | POA: Diagnosis not present

## 2020-04-09 DIAGNOSIS — Z01419 Encounter for gynecological examination (general) (routine) without abnormal findings: Secondary | ICD-10-CM | POA: Diagnosis not present

## 2020-07-21 ENCOUNTER — Telehealth: Payer: Self-pay | Admitting: Oncology

## 2020-07-21 NOTE — Telephone Encounter (Signed)
Spoke to patient to confirm afternoon [facility] appointment for 4/20, packet will be emailed to patient

## 2020-07-22 ENCOUNTER — Telehealth: Payer: Self-pay | Admitting: Hematology and Oncology

## 2020-07-22 ENCOUNTER — Encounter: Payer: Self-pay | Admitting: Physician Assistant

## 2020-07-22 NOTE — Telephone Encounter (Signed)
Received a new patient referral from Dr. Quintin Alto for invasive ductal carcinoma. Holly Allison has been cld and scheduled to see Dr. Lindi Adie on 4/26 at 1pm. Pt aware to arrive 20 minutes early.

## 2020-07-29 ENCOUNTER — Other Ambulatory Visit: Payer: Self-pay | Admitting: General Surgery

## 2020-07-29 DIAGNOSIS — Z170 Estrogen receptor positive status [ER+]: Secondary | ICD-10-CM

## 2020-07-29 DIAGNOSIS — C50412 Malignant neoplasm of upper-outer quadrant of left female breast: Secondary | ICD-10-CM

## 2020-08-01 ENCOUNTER — Other Ambulatory Visit: Payer: Self-pay | Admitting: General Surgery

## 2020-08-01 DIAGNOSIS — C50412 Malignant neoplasm of upper-outer quadrant of left female breast: Secondary | ICD-10-CM

## 2020-08-01 DIAGNOSIS — Z170 Estrogen receptor positive status [ER+]: Secondary | ICD-10-CM

## 2020-08-04 NOTE — Progress Notes (Signed)
[facility] CONSULT NOTE  Patient Care Team: Sasser, Silvestre Moment, MD as PCP - General (Cardiology)  CHIEF COMPLAINTS/PURPOSE OF CONSULTATION:  Newly diagnosed breast cancer  HISTORY OF PRESENTING ILLNESS:  Holly Allison 55 y.o. female is here because of recent diagnosis of invasive ductal carcinoma of the left breast. Screening mammogram on 06/16/20 showed possible asymmetries in the bilateral breasts. Diagnostic mammogram and [name] on 07/02/20 showed a 0.5cm mass at the 2:30 position in the left breast, with no left axillary adenopathy, and no focal abnormalities in the right breast. Biopsy on 07/16/20 showed invasive and in situ ductal carcinoma, grade 1, strong ER/PR positive, HER-2 negative, Ki67 <5%. She has a family history of breast cancer in her maternal grand mother at 75 and pancreatic cancer in her maternal aunt. She presents to the clinic today for initial evaluation and discussion of treatment options.   I reviewed her records extensively and collaborated the history with the patient.  SUMMARY OF ONCOLOGIC HISTORY: Oncology History  Malignant neoplasm of upper-outer quadrant of left breast in female, estrogen receptor positive ([place])  07/16/2020 Initial Diagnosis   Screening mammogram detected possible asymmetries in the bilateral breasts. 0.5cm mass at the 2:30 position in the left breast, with no left axillary adenopathy, and no focal abnormalities in the right breast. Biopsy showed invasive and in situ ductal carcinoma, grade 1, strong ER/PR positive, HER-2 negative, Ki67 <5%   08/05/2020 Cancer Staging   Staging form: Breast, AJCC 8th Edition - Clinical stage from 08/05/2020: Stage IA (cT1a, cN0, cM0, G1, ER+, PR+, HER2-) - Signed by Nicholas Lose, MD on 08/05/2020 Stage prefix: Initial diagnosis Histologic grading system: 3 grade system     MEDICAL HISTORY:  Past Medical History:  Diagnosis Date  . Angio-edema   . Urticaria     SURGICAL HISTORY: Past Surgical  History:  Procedure Laterality Date  . CESAREAN SECTION    . TONSILLECTOMY      SOCIAL HISTORY: Social History   Socioeconomic History  . Marital status: Married    Spouse name: Not on file  . Number of children: Not on file  . Years of education: Not on file  . Highest education level: Not on file  Occupational History  . Not on file  Tobacco Use  . Smoking status: Never Smoker  . Smokeless tobacco: Never Used  Vaping Use  . Vaping Use: Never used  Substance and Sexual Activity  . Alcohol use: No  . Drug use: No  . Sexual activity: Not on file  Other Topics Concern  . Not on file  Social History Narrative  . Not on file   Social Determinants of Health   Financial Resource Strain: Not on file  Food Insecurity: Not on file  Transportation Needs: Not on file  Physical Activity: Not on file  Stress: Not on file  Social Connections: Not on file  Intimate Partner Violence: Not on file    FAMILY HISTORY: Family History  Problem Relation Age of Onset  . Urticaria Neg Hx   . Allergic rhinitis Neg Hx   . Angioedema Neg Hx   . Asthma Neg Hx   . Atopy Neg Hx   . Eczema Neg Hx   . Immunodeficiency Neg Hx     ALLERGIES:  has No Known Allergies.  MEDICATIONS:  Current Outpatient Medications  Medication Sig Dispense Refill  . cetirizine (ZYRTEC) 10 MG tablet Take 10 mg by mouth daily.    . famotidine (PEPCID) 40 MG  tablet Take 40 mg by mouth 2 (two) times daily.     Current Facility-Administered Medications  Medication Dose Route Frequency Provider Last Rate Last Admin  . omalizumab Arvid Right) injection 300 mg  300 mg Subcutaneous Q28 days Valentina Shaggy, MD   300 mg at 07/25/19 1426    REVIEW OF SYSTEMS:     All other systems were reviewed with the patient and are negative.  PHYSICAL EXAMINATION: ECOG PERFORMANCE STATUS: 1 - Symptomatic but completely ambulatory  Vitals:   08/05/20 1256  BP: (!) 149/80  Pulse: 70  Resp: 20  Temp: 97.7 F (36.5 C)   SpO2: 100%   Filed Weights   08/05/20 1256  Weight: 191 lb 12.8 oz (87 kg)      LABORATORY DATA:  I have reviewed the data as listed Lab Results  Component Value Date   WBC 6.1 07/29/2015   HGB 12.9 07/29/2015   HCT 38.5 07/29/2015   MCV 91.0 07/29/2015   PLT 316 07/29/2015   Lab Results  Component Value Date   NA 143 07/29/2015   K 3.9 07/29/2015   CL 104 07/29/2015   CO2 30 07/29/2015    RADIOGRAPHIC STUDIES: I have personally reviewed the radiological reports and agreed with the findings in the report.  ASSESSMENT AND PLAN:  Malignant neoplasm of upper-outer quadrant of left breast in female, estrogen receptor positive ([place]) 07/16/2020:Screening mammogram detected possible asymmetries in the bilateral breasts. 0.5cm mass at the 2:30 position in the left breast, with no left axillary adenopathy, and no focal abnormalities in the right breast. Biopsy showed invasive and in situ ductal carcinoma, grade 1, strong ER/PR positive, HER-2 negative, Ki67 <5%  Pathology and radiology counseling:Discussed with the patient, the details of pathology including the type of breast cancer,the clinical staging, the significance of ER, PR and HER-2/neu receptors and the implications for treatment. After reviewing the pathology in detail, we proceeded to discuss the different treatment options between surgery, radiation, chemotherapy, antiestrogen therapies.  Recommendations: 1. Breast conserving surgery followed by 2. Oncotype DX testing if the final pathology report shows the tumor that is grade 2 or more than 1 cm 3. Adjuvant radiation therapy followed by 4. Adjuvant antiestrogen therapy  Oncotype counseling: I discussed Oncotype DX test. I explained to the patient that this is a 21 gene panel to evaluate patient tumors DNA to calculate recurrence score. This would help determine whether patient has high risk or low risk breast cancer. She understands that if her tumor was found to be high  risk, she would benefit from systemic chemotherapy. If low risk, no need of chemotherapy.  Return to clinic after surgery to discuss final pathology report and then determine if Oncotype DX testing will need to be sent.     All questions were answered. The patient knows to call the clinic with any problems, questions or concerns.   Rulon Eisenmenger, MD, MPH 08/05/2020    I, Molly Dorshimer, am acting as scribe for Nicholas Lose, MD.  I have reviewed the above documentation for accuracy and completeness, and I agree with the above.

## 2020-08-05 ENCOUNTER — Telehealth: Payer: Self-pay | Admitting: *Deleted

## 2020-08-05 ENCOUNTER — Encounter: Payer: Self-pay | Admitting: *Deleted

## 2020-08-05 ENCOUNTER — Other Ambulatory Visit: Payer: Self-pay

## 2020-08-05 ENCOUNTER — Inpatient Hospital Stay: Payer: BC Managed Care – PPO | Attending: Hematology and Oncology | Admitting: Hematology and Oncology

## 2020-08-05 ENCOUNTER — Encounter: Payer: Self-pay | Admitting: Rehabilitation

## 2020-08-05 ENCOUNTER — Ambulatory Visit: Payer: BC Managed Care – PPO | Attending: General Surgery | Admitting: Rehabilitation

## 2020-08-05 DIAGNOSIS — C50412 Malignant neoplasm of upper-outer quadrant of left female breast: Secondary | ICD-10-CM | POA: Insufficient documentation

## 2020-08-05 DIAGNOSIS — C50911 Malignant neoplasm of unspecified site of right female breast: Secondary | ICD-10-CM | POA: Insufficient documentation

## 2020-08-05 DIAGNOSIS — Z170 Estrogen receptor positive status [ER+]: Secondary | ICD-10-CM | POA: Diagnosis present

## 2020-08-05 DIAGNOSIS — R293 Abnormal posture: Secondary | ICD-10-CM | POA: Diagnosis present

## 2020-08-05 NOTE — Assessment & Plan Note (Signed)
07/16/2020:Screening mammogram detected possible asymmetries in the bilateral breasts. 0.5cm mass at the 2:30 position in the left breast, with no left axillary adenopathy, and no focal abnormalities in the right breast. Biopsy showed invasive and in situ ductal carcinoma, grade 1, strong ER/PR positive, HER-2 negative, Ki67 <5%  Pathology and radiology counseling:Discussed with the patient, the details of pathology including the type of breast cancer,the clinical staging, the significance of ER, PR and HER-2/neu receptors and the implications for treatment. After reviewing the pathology in detail, we proceeded to discuss the different treatment options between surgery, radiation, chemotherapy, antiestrogen therapies.  Recommendations: 1. Breast conserving surgery followed by 2. Oncotype DX testing if the final pathology report shows the tumor that is grade 2 or more than 1 cm 3. Adjuvant radiation therapy followed by 4. Adjuvant antiestrogen therapy  Oncotype counseling: I discussed Oncotype DX test. I explained to the patient that this is a 21 gene panel to evaluate patient tumors DNA to calculate recurrence score. This would help determine whether patient has high risk or low risk breast cancer. She understands that if her tumor was found to be high risk, she would benefit from systemic chemotherapy. If low risk, no need of chemotherapy.  Return to clinic after surgery to discuss final pathology report and then determine if Oncotype DX testing will need to be sent.

## 2020-08-05 NOTE — Telephone Encounter (Signed)
Spoke to pt regarding navigation resources and provided contact information. Denies questions or concerns regarding dx or treatment care plan. Encourage pt to call with needs. Received verbal understanding. 

## 2020-08-05 NOTE — Patient Instructions (Signed)

## 2020-08-05 NOTE — Therapy (Signed)
[place], [place], [address] Phone: [phone]   Fax:  [phone]  Physical Therapy Evaluation  Patient Details  Name: Holly Allison MRN: [id number] Date of Birth: 10-19-1965 Referring Provider (PT): Dr. [name]   Encounter Date: 08/05/2020   PT End of Session - 08/05/20 0836    Visit Number 1    Number of Visits 2    Date for PT Re-Evaluation 09/16/20    PT Start Time 0802    PT Stop Time 0832    PT Time Calculation (min) 30 min    Activity Tolerance Patient tolerated treatment well    Behavior During Therapy [facility] for tasks assessed/performed           Past Medical History:  Diagnosis Date  . Angio-edema   . Urticaria     Past Surgical History:  Procedure Laterality Date  . CESAREAN SECTION    . TONSILLECTOMY      There were no vitals filed for this visit.    Subjective Assessment - 08/05/20 0801    Subjective I am doing okay    Pertinent History New diagnosis of Left breast cancer with scheduled lumpectomy with SLNB with radiation.  Cancer is ER positive.  No other health problems.    Currently in Pain? No/denies              [facility] PT Assessment - 08/05/20 0001      Assessment   Medical Diagnosis left breast cancer    Referring Provider (PT) Dr. [name]    Onset Date/Surgical Date 08/05/20    Hand Dominance Right    Next MD Visit today      Precautions   Precaution Comments active cancer      Balance Screen   Has the patient fallen in the past 6 months No    Has the patient had a decrease in activity level because of a fear of falling?  No    Is the patient reluctant to leave their home because of a fear of falling?  No      Home Ecologist residence    Living Arrangements Spouse/significant other;Children      Prior Function   Level of Independence Independent    Vocation Full time employment    Sports coach    Leisure not currently exercising      Cognition   Overall Cognitive Status Within Functional Limits for tasks assessed      Posture/Postural Control   Posture/Postural Control Postural limitations    Postural Limitations Rounded Shoulders;Forward head      ROM / Strength   AROM / PROM / Strength AROM      AROM   AROM Assessment Site Shoulder    Right/Left Shoulder Left    Left Shoulder Extension 60 Degrees    Left Shoulder Flexion 156 Degrees    Left Shoulder ABduction 160 Degrees    Left Shoulder Internal Rotation 85 Degrees    Left Shoulder External Rotation 105 Degrees             LYMPHEDEMA/ONCOLOGY QUESTIONNAIRE - 08/05/20 0001      Type   Cancer Type left breast      Lymphedema Assessments   Lymphedema Assessments Upper extremities      Right Upper Extremity Lymphedema   15 cm Proximal to Olecranon Process 35.3 cm    Olecranon Process 29.5 cm    10 cm Proximal to  Ulnar Styloid Process 23.7 cm    Just Proximal to Ulnar Styloid Process 17.5 cm    Across Hand at [employer] 20.4 cm    At [place] of 2nd Digit 6.8 cm      Left Upper Extremity Lymphedema   15 cm Proximal to Olecranon Process 35.3 cm    Olecranon Process 30.5 cm    10 cm Proximal to Ulnar Styloid Process 23.3 cm    Just Proximal to Ulnar Styloid Process 17 cm    Across Hand at [employer] 21.2 cm    At [place] of 2nd Digit 6.8 cm           L-DEX FLOWSHEETS - 08/05/20 0800      L-DEX LYMPHEDEMA SCREENING   Measurement Type Unilateral    L-DEX MEASUREMENT EXTREMITY Upper Extremity    DOMINANT SIDE Right    At Risk Side Right    BASELINE SCORE (UNILATERAL) -1.6                Quick Dash - 08/05/20 0001    Open a tight or new jar Mild difficulty    Do heavy household chores (wash walls, wash floors) No difficulty    Carry a shopping bag or briefcase No difficulty    Wash your back No difficulty    Use a knife to cut food No difficulty    Recreational activities  in which you take some force or impact through your arm, shoulder, or hand (golf, hammering, tennis) Moderate difficulty    During the past week, to what extent has your arm, shoulder or hand problem interfered with your normal social activities with family, friends, neighbors, or groups? Not at all    During the past week, to what extent has your arm, shoulder or hand problem limited your work or other regular daily activities Not at all    Arm, shoulder, or hand pain. Mild    Tingling (pins and needles) in your arm, shoulder, or hand None    Difficulty Sleeping No difficulty    DASH Score 9.09 %            Objective measurements completed on examination: See above findings.               PT Education - 08/05/20 0835    Education Details post op stretches and how to alter if switching to mastectomy, lymphedema briefly, SOZO and POC    Person(s) Educated Patient;Child(ren)    Methods Explanation;Demonstration;Handout    Comprehension Verbalized understanding               PT Long Term Goals - 08/05/20 0841      PT LONG TERM GOAL #1   Title Pt will demonstrate return to baseline AROM    Time 6    Period Weeks    Status New      PT LONG TERM GOAL #2   Title Pt will be scheduled for continued SOZO surveillance for lymphedema    Time [place] [facility] Goals - 08/05/20 0841      Patient will be able to verbalize understanding of pertinent lymphedema risk reduction practices relevant to her diagnosis specifically related to skin care.   Status Achieved      Patient will be able to return demonstrate and/or verbalize understanding of the post-op home exercise program related to regaining  shoulder range of motion.   Status Achieved      Patient will be able to verbalize understanding of the importance of attending the postoperative After Breast Cancer Class for further lymphedema risk reduction education and therapeutic  exercise.   Status Achieved                 Plan - 08/05/20 0836    Clinical Impression Statement Pt presents for baseline surveillance prior to Rt lumpectomy and SLNB.  Pt may switch to mastectomy pending genetics results.  Pt was educated on post op stretches lumpectomy vs mastectomy, lymphedema briefly, and how to use SOZO to watch for lymphedema.  Pt was encouraged to start walking for treatment tolerance.  Pt is scheduled for post op follow up.    Personal Factors and Comorbidities Fitness    Stability/Clinical Decision Making Stable/Uncomplicated    Clinical Decision Making Low    Rehab Potential Excellent    PT Frequency --   1 more visit   PT Duration 6 weeks    PT Treatment/Interventions ADLs/Self Care Home Management;Therapeutic exercise;Manual lymph drainage    PT Next Visit Plan post op recheck, sign up ABC class, schedule SOZO    PT Home Exercise Plan post op    Consulted and Agree with Plan of Care Patient;Family member/caregiver           Patient will benefit from skilled therapeutic intervention in order to improve the following deficits and impairments:  Postural dysfunction,Decreased knowledge of precautions  Visit Diagnosis: Abnormal posture  Malignant neoplasm of right breast in female, estrogen receptor positive, unspecified site of breast [facility])     Problem List Patient Active Problem List   Diagnosis Date Noted  . Allergic rhinitis 07/29/2015  . Angioedema 07/29/2015  . Acute urticaria 08/31/2014  . Allergic reaction 08/30/2014  . Urticaria, acute 08/30/2014  . Urticaria 08/30/2014  . GERD (gastroesophageal reflux disease) 10/21/2012  . Other pancytopenia ([place]) 10/21/2012  . Hypotension, unspecified 10/20/2012  . Dehydration 10/20/2012  . 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 Leukopenia 10/19/2012  . Thrombocytopenia ([place]) 10/19/2012  . Elevated transaminase level 10/19/2012  . Tachycardia 10/19/2012  . Hypokalemia 10/19/2012    Stark Bray 08/05/2020, 8:42 AM  [place], [place], [address] Phone: [phone]   Fax:  [phone]  Name: Holly Allison MRN: [id number] Date of Birth: 04/29/1965

## 2020-08-05 NOTE — Progress Notes (Signed)
Surgical Instructions    Your procedure is scheduled on Thursday, May 5th, 2022  Report to [facility] Main Entrance "A" at 07:00 A.M., then check in with the Admitting office.  Call this number if you have problems the morning of surgery:  [phone]   If you have any questions prior to your surgery date call [phone]: Open Monday-Friday 8am-4pm    Remember:  Do not eat after midnight the night before your surgery  You may drink clear liquids until 06:00 A.M. the morning of your surgery.   Clear liquids allowed are: Water, Non-Citrus Juices (without pulp), Carbonated Beverages, Clear Tea, Black Coffee Only, and Gatorade  Patient Instructions  . The night before surgery:  o No food after midnight. ONLY clear liquids after midnight  . The day of surgery (if you do NOT have diabetes):  o Drink ONE (1) Pre-Surgery Clear Ensure by 06:00 A.M. the morning of surgery. Drink in one sitting. Do not sip.  o This drink was given to you during your hospital  pre-op appointment visit.  o Nothing else to drink after completing the  Pre-Surgery Clear Ensure.         If you have questions, please contact your surgeon's office.     Take these medicines the morning of surgery with A SIP OF WATER   cetirizine (ZYRTEC)  famotidine (PEPCID)  As of today, STOP taking any Aspirin (unless otherwise instructed by your surgeon) Aleve, Naproxen, Ibuprofen, Motrin, Advil, Goody's, BC's, all herbal medications, fish oil, and all vitamins.                     Do not wear jewelry, make up, or nail polish            Do not wear lotions, powders, perfumes, or deodorant.            Do not shave 48 hours prior to surgery.             Do not bring valuables to the hospital.            [facility] is not responsible for any belongings or valuables.  Do NOT Smoke (Tobacco/Vaping) or drink Alcohol 24 hours prior to your procedure If you use a CPAP at night, you may bring all equipment for your overnight  stay.   Contacts, glasses, dentures or bridgework may not be worn into surgery, please bring cases for these belongings   For patients admitted to the hospital, discharge time will be determined by your treatment team.   Patients discharged the day of surgery will not be allowed to drive home, and someone needs to stay with them for 24 hours.    Special instructions:   [place]- Preparing For Surgery  Before surgery, you can play an important role. Because skin is not sterile, your skin needs to be as free of germs as possible. You can reduce the number of germs on your skin by washing with CHG (chlorahexidine gluconate) Soap before surgery.  CHG is an antiseptic cleaner which kills germs and bonds with the skin to continue killing germs even after washing.    Oral Hygiene is also important to reduce your risk of infection.  Remember - BRUSH YOUR TEETH THE MORNING OF SURGERY WITH YOUR REGULAR TOOTHPASTE  Please do not use if you have an allergy to CHG or antibacterial soaps. If your skin becomes reddened/irritated stop using the CHG.  Do not shave (including legs and underarms) for  at least 48 hours prior to first CHG shower. It is OK to shave your face.  Please follow these instructions carefully.   1. Shower the NIGHT BEFORE SURGERY and the MORNING OF SURGERY  2. If you chose to wash your hair, wash your hair first as usual with your normal shampoo.  3. After you shampoo, rinse your hair and body thoroughly to remove the shampoo.  4. Wash Face and genitals (private parts) with your normal soap.   5.  Shower the NIGHT BEFORE SURGERY and the MORNING OF SURGERY with CHG Soap.   6. Use CHG Soap as you would any other liquid soap. You can apply CHG directly to the skin and wash gently with a scrungie or a clean washcloth.   7. Apply the CHG Soap to your body ONLY FROM THE NECK DOWN.  Do not use on open wounds or open sores. Avoid contact with your eyes, ears, mouth and genitals  (private parts). Wash Face and genitals (private parts)  with your normal soap.   8. Wash thoroughly, paying special attention to the area where your surgery will be performed.  9. Thoroughly rinse your body with warm water from the neck down.  10. DO NOT shower/wash with your normal soap after using and rinsing off the CHG Soap.  11. Pat yourself dry with a CLEAN TOWEL.  12. Wear CLEAN PAJAMAS to bed the night before surgery  13. Place CLEAN SHEETS on your bed the night before your surgery  14. DO NOT SLEEP WITH PETS.   Day of Surgery: Take a shower with CHG soap. Wear Clean/Comfortable clothing the morning of surgery Do not apply any deodorants/lotions.   Remember to brush your teeth WITH YOUR REGULAR TOOTHPASTE.   Please read over the following fact sheets that you were given.

## 2020-08-06 ENCOUNTER — Encounter (HOSPITAL_COMMUNITY)
Admission: RE | Admit: 2020-08-06 | Discharge: 2020-08-06 | Disposition: A | Discharge status: Home / Self Care | Payer: BC Managed Care – PPO | Source: Ambulatory Visit | Attending: General Surgery | Admitting: General Surgery

## 2020-08-06 ENCOUNTER — Other Ambulatory Visit: Payer: Self-pay

## 2020-08-06 ENCOUNTER — Encounter (HOSPITAL_COMMUNITY): Payer: Self-pay

## 2020-08-06 DIAGNOSIS — R011 Cardiac murmur, unspecified: Secondary | ICD-10-CM | POA: Diagnosis not present

## 2020-08-06 DIAGNOSIS — Z79899 Other long term (current) drug therapy: Secondary | ICD-10-CM | POA: Insufficient documentation

## 2020-08-06 DIAGNOSIS — R7303 Prediabetes: Secondary | ICD-10-CM | POA: Diagnosis not present

## 2020-08-06 DIAGNOSIS — Z01818 Encounter for other preprocedural examination: Secondary | ICD-10-CM | POA: Diagnosis present

## 2020-08-06 DIAGNOSIS — C50912 Malignant neoplasm of unspecified site of left female breast: Secondary | ICD-10-CM | POA: Insufficient documentation

## 2020-08-06 HISTORY — DX: Family history of other specified conditions: Z84.89

## 2020-08-06 HISTORY — DX: Cardiac murmur, unspecified: R01.1

## 2020-08-06 HISTORY — DX: Prediabetes: R73.03

## 2020-08-06 HISTORY — DX: Malignant (primary) neoplasm, unspecified: C80.1

## 2020-08-06 LAB — CBC
HCT: 37.7 % (ref 36.0–46.0)
Hemoglobin: 12.8 g/dL (ref 12.0–15.0)
MCH: 31.1 pg (ref 26.0–34.0)
MCHC: 34 g/dL (ref 30.0–36.0)
MCV: 91.7 fL (ref 80.0–100.0)
Platelets: 301 10*3/uL (ref 150–400)
RBC: 4.11 MIL/uL (ref 3.87–5.11)
RDW: 12.4 % (ref 11.5–15.5)
WBC: 7.7 10*3/uL (ref 4.0–10.5)
nRBC: 0 % (ref 0.0–0.2)

## 2020-08-06 LAB — BASIC METABOLIC PANEL
Anion gap: 8 (ref 5–15)
BUN: 10 mg/dL (ref 6–20)
CO2: 28 mmol/L (ref 22–32)
Calcium: 9.3 mg/dL (ref 8.9–10.3)
Chloride: 102 mmol/L (ref 98–111)
Creatinine, Ser: 0.79 mg/dL (ref 0.44–1.00)
GFR, Estimated: >60 mL/min (ref >60)
Glucose, Bld: 95 mg/dL (ref 70–99)
Potassium: 3.8 mmol/L (ref 3.5–5.1)
Sodium: 138 mmol/L (ref 135–145)

## 2020-08-06 LAB — HEMOGLOBIN A1C
Hgb A1c MFr Bld: 5.5 % (ref 4.8–5.6)
Mean Plasma Glucose: 111.15 mg/dL

## 2020-08-06 NOTE — Progress Notes (Signed)
PCP - Dr. Quintin Alto @ [facility] - Dr. Carlyle Dolly    Chest x-ray - n/a EKG - 08/06/20 Stress Test - n/a ECHO - 11/18/16 Cardiac Cath - denies  Sleep Study - denies CPAP - n/a  ERAS Protcol - Yes PRE-SURGERY Ensure or G2- Water  COVID TEST- Scheduled for 08/11/20   Anesthesia review: yes, seed placement   Patient denies shortness of breath, fever, cough and chest pain at PAT appointment   All instructions explained to the patient, with a verbal understanding of the material. Patient agrees to go over the instructions while at home for a better understanding. Patient also instructed to self quarantine after being tested for COVID-19. The opportunity to ask questions was provided.

## 2020-08-06 NOTE — Progress Notes (Signed)
Surgical Instructions    Your procedure is scheduled on Thursday, May 5th, 2022  Report to [facility] Main Entrance "A" at 07:00 A.M., then check in with the Admitting office.  Call this number if you have problems the morning of surgery:  [phone]   If you have any questions prior to your surgery date call [phone]: Open Monday-Friday 8am-4pm    Remember:  Do not eat after midnight the night before your surgery  You may drink clear liquids until 06:00 A.M. the morning of your surgery.   Clear liquids allowed are: Water, Non-Citrus Juices (without pulp), Carbonated Beverages, Clear Tea, Black Coffee Only, and Gatorade  Patient Instructions  . The night before surgery:  o No food after midnight. ONLY clear liquids after midnight   . The day of surgery (if you have diabetes): o Drink ONE (1) 10 oz water bottle given to you in your pre admission testing appointment by 0600 AM the morning of surgery. Drink in one sitting. Do not sip.  o This drink was given to you during your hospital  pre-op appointment visit.  o Nothing else to drink after completing the  10 oz bottle of water.          If you have questions, please contact your surgeon's office.    Take these medicines the morning of surgery with A SIP OF WATER   cetirizine (ZYRTEC)  famotidine (PEPCID)  As of today, STOP taking any Aspirin (unless otherwise instructed by your surgeon) Aleve, Naproxen, Ibuprofen, Motrin, Advil, Goody's, BC's, all herbal medications, fish oil, and all vitamins.                     Do not wear jewelry, make up, or nail polish            Do not wear lotions, powders, perfumes, or deodorant.            Do not shave 48 hours prior to surgery.             Do not bring valuables to the hospital.            [facility] is not responsible for any belongings or valuables.  Do NOT Smoke (Tobacco/Vaping) or drink Alcohol 24 hours prior to your procedure If you use a CPAP at night, you may  bring all equipment for your overnight stay.   Contacts, glasses, dentures or bridgework may not be worn into surgery, please bring cases for these belongings   For patients admitted to the hospital, discharge time will be determined by your treatment team.   Patients discharged the day of surgery will not be allowed to drive home, and someone needs to stay with them for 24 hours.    Special instructions:   [facility]- Preparing For Surgery  Before surgery, you can play an important role. Because skin is not sterile, your skin needs to be as free of germs as possible. You can reduce the number of germs on your skin by washing with CHG (chlorahexidine gluconate) Soap before surgery.  CHG is an antiseptic cleaner which kills germs and bonds with the skin to continue killing germs even after washing.    Oral Hygiene is also important to reduce your risk of infection.  Remember - BRUSH YOUR TEETH THE MORNING OF SURGERY WITH YOUR REGULAR TOOTHPASTE  Please do not use if you have an allergy to CHG or antibacterial soaps. If your skin becomes reddened/irritated stop using the  CHG.  Do not shave (including legs and underarms) for at least 48 hours prior to first CHG shower. It is OK to shave your face.  Please follow these instructions carefully.   1. Shower the NIGHT BEFORE SURGERY and the MORNING OF SURGERY  2. If you chose to wash your hair, wash your hair first as usual with your normal shampoo.  3. After you shampoo, rinse your hair and body thoroughly to remove the shampoo.  4. Wash Face and genitals (private parts) with your normal soap.   5.  Shower the NIGHT BEFORE SURGERY and the MORNING OF SURGERY with CHG Soap.   6. Use CHG Soap as you would any other liquid soap. You can apply CHG directly to the skin and wash gently with a scrungie or a clean washcloth.   7. Apply the CHG Soap to your body ONLY FROM THE NECK DOWN.  Do not use on open wounds or open sores. Avoid contact with  your eyes, ears, mouth and genitals (private parts). Wash Face and genitals (private parts)  with your normal soap.   8. Wash thoroughly, paying special attention to the area where your surgery will be performed.  9. Thoroughly rinse your body with warm water from the neck down.  10. DO NOT shower/wash with your normal soap after using and rinsing off the CHG Soap.  11. Pat yourself dry with a CLEAN TOWEL.  12. Wear CLEAN PAJAMAS to bed the night before surgery  13. Place CLEAN SHEETS on your bed the night before your surgery  14. DO NOT SLEEP WITH PETS.   Day of Surgery: Take a shower with CHG soap. Wear Clean/Comfortable clothing the morning of surgery Do not apply any deodorants/lotions.   Remember to brush your teeth WITH YOUR REGULAR TOOTHPASTE.   Please read over the following fact sheets that you were given.

## 2020-08-06 NOTE — Progress Notes (Signed)
PCP - Dr. Quintin Alto @ [place] - Dr. Carlyle Dolly    Chest x-ray - n/a EKG - 08/06/20 Stress Test - n/a ECHO - 11/18/16 Cardiac Cath - denies  Sleep Study - denies CPAP - n/a  ERAS Protcol - Yes PRE-SURGERY Ensure or G2- Water  COVID TEST- Scheduled for 08/11/20   Anesthesia review: No  Patient denies shortness of breath, fever, cough and chest pain at PAT appointment   All instructions explained to the patient, with a verbal understanding of the material. Patient agrees to go over the instructions while at home for a better understanding. Patient also instructed to self quarantine after being tested for COVID-19. The opportunity to ask questions was provided.

## 2020-08-07 NOTE — Anesthesia Preprocedure Evaluation (Addendum)
Anesthesia Evaluation  Patient identified by MRN, date of birth, ID band Patient awake    Reviewed: Allergy & Precautions, NPO status , Patient's Chart, lab work & pertinent test results  Airway Mallampati: II  TM Distance: >3 FB Neck ROM: Full    Dental  (+) Teeth Intact, Dental Advisory Given   Pulmonary    breath sounds clear to auscultation       Cardiovascular  Rhythm:Regular Rate:Normal     Neuro/Psych    GI/Hepatic   Endo/Other    Renal/GU      Musculoskeletal   Abdominal   Peds  Hematology   Anesthesia Other Findings   Reproductive/Obstetrics                             Anesthesia Physical Anesthesia Plan  ASA: II  Anesthesia Plan: General   Post-op Pain Management:  Regional for Post-op pain   Induction: Intravenous  PONV Risk Score and Plan: Ondansetron and Dexamethasone  Airway Management Planned: LMA  Additional Equipment:   Intra-op Plan:   Post-operative Plan:   Informed Consent: I have reviewed the patients History and Physical, chart, labs and discussed the procedure including the risks, benefits and alternatives for the proposed anesthesia with the patient or authorized representative who has indicated his/her understanding and acceptance.     Dental advisory given  Plan Discussed with: CRNA and Anesthesiologist  Anesthesia Plan Comments: (See APP note by Durel Salts, FNP )       Anesthesia Quick Evaluation

## 2020-08-07 NOTE — Progress Notes (Signed)
Anesthesia Chart Review:   Case: [id number] Date/Time: 08/14/20 0845   Procedure: LEFT BREAST LUMPECTOMY WITH RADIOACTIVE SEED AND LEFT AXILLARY SENTINEL LYMPH NODE BIOPSY (Left Breast)   Anesthesia type: General   Pre-op diagnosis: LEFT BREAST CANCER   Location: [place] OR ROOM 02 / [place] OR   Surgeons: Rolm Bookbinder, Holly Allison      DISCUSSION:. Pt is 55 years old with hx heart murmur (trivial mitral and tricuspid regurgitation on 2018 echo), pre-diabetes   VS: BP (!) 140/91   Pulse 76   Temp 37 C (Oral)   Resp 20   Ht 5\' 4"  (1.626 m)   Wt 87.3 kg   LMP 07/07/2014   SpO2 98%   BMI 33.04 kg/m    PROVIDERS: - PCP is Holly Allison, Holly Moment, Holly Allison - Cardiologist is Holly Dolly, Holly Allison who follows pt for palpitations (PACS, PVCs). Last office visit 01/11/17.    LABS: Labs reviewed: Acceptable for surgery. (all labs ordered are listed, but only abnormal results are displayed)  Labs Reviewed  HEMOGLOBIN A1C  CBC  BASIC METABOLIC PANEL    EKG 6/96/78: NSR. ST & T wave abnormality, consider anterior ischemia. Appears stable when compared with EKG 10/29/16 - saw cardiology at the time of this comparison EKG; echo ordered (heart function normal -see below), no further testing recommended.   CV: Echo 11/18/16:  - Left ventricle: The cavity size was normal. Wall thickness was normal. Systolic function was normal. The estimated ejection fraction was in the range of 60% to 65%. Left ventricular diastolic function parameters were normal.   Holter monitor 11/01/16:   Min HR 42, Max HR 141, Avg HR 74. 1.6 second sinus pause in early morning presumably while sleeping  Rare supraventricular ectopy in the form of isolated PACs  Rare ventriculcar ectopy in the setting of isolated PVCs  No symptoms reported  Telemetry tracings show sinus rhythm with rare PACs and PVCs  No significant arrhythmias     Past Medical History:  Diagnosis Date  . Angio-edema   . Cancer ([place])   . Family history of adverse  reaction to anesthesia    Mother   . Heart murmur    Since childhood  . Pre-diabetes    Patient states she was told she is prediabetic at yearly physical two months ago. Patient states her A1 C was around 5.6  . Urticaria     Past Surgical History:  Procedure Laterality Date  . CESAREAN SECTION    . TONSILLECTOMY      MEDICATIONS: . cetirizine (ZYRTEC) 10 MG tablet  . famotidine (PEPCID) 40 MG tablet   . omalizumab Arvid Right) injection 300 mg    If no changes, I anticipate pt can proceed with surgery as scheduled.   Willeen Cass, PhD, FNP-BC [facility]/Anesthesiology Phone: [phone] 08/07/2020 12:33 PM

## 2020-08-08 ENCOUNTER — Encounter: Payer: Self-pay | Admitting: Genetic Counselor

## 2020-08-08 DIAGNOSIS — Z1379 Encounter for other screening for genetic and chromosomal anomalies: Secondary | ICD-10-CM | POA: Insufficient documentation

## 2020-08-11 ENCOUNTER — Other Ambulatory Visit (HOSPITAL_COMMUNITY)
Admission: RE | Admit: 2020-08-11 | Discharge: 2020-08-11 | Disposition: A | Discharge status: Home / Self Care | Payer: BC Managed Care – PPO | Source: Ambulatory Visit | Attending: General Surgery | Admitting: General Surgery

## 2020-08-11 DIAGNOSIS — Z170 Estrogen receptor positive status [ER+]: Secondary | ICD-10-CM | POA: Diagnosis not present

## 2020-08-11 DIAGNOSIS — Z803 Family history of malignant neoplasm of breast: Secondary | ICD-10-CM | POA: Diagnosis not present

## 2020-08-11 DIAGNOSIS — C50412 Malignant neoplasm of upper-outer quadrant of left female breast: Secondary | ICD-10-CM | POA: Diagnosis not present

## 2020-08-11 DIAGNOSIS — Z01812 Encounter for preprocedural laboratory examination: Secondary | ICD-10-CM | POA: Insufficient documentation

## 2020-08-11 DIAGNOSIS — Z809 Family history of malignant neoplasm, unspecified: Secondary | ICD-10-CM | POA: Diagnosis not present

## 2020-08-11 DIAGNOSIS — Z20822 Contact with and (suspected) exposure to covid-19: Secondary | ICD-10-CM | POA: Insufficient documentation

## 2020-08-11 DIAGNOSIS — Z86010 Personal history of colonic polyps: Secondary | ICD-10-CM | POA: Diagnosis not present

## 2020-08-11 DIAGNOSIS — D360 Benign neoplasm of lymph nodes: Secondary | ICD-10-CM | POA: Diagnosis not present

## 2020-08-12 ENCOUNTER — Ambulatory Visit
Admission: RE | Admit: 2020-08-12 | Discharge: 2020-08-12 | Disposition: A | Discharge status: Home / Self Care | Payer: BC Managed Care – PPO | Source: Ambulatory Visit | Attending: General Surgery | Admitting: General Surgery

## 2020-08-12 ENCOUNTER — Other Ambulatory Visit: Payer: Self-pay

## 2020-08-12 DIAGNOSIS — C50412 Malignant neoplasm of upper-outer quadrant of left female breast: Secondary | ICD-10-CM

## 2020-08-12 DIAGNOSIS — Z170 Estrogen receptor positive status [ER+]: Secondary | ICD-10-CM

## 2020-08-12 LAB — SARS CORONAVIRUS 2 (TAT 6-24 HRS): SARS Coronavirus 2: NEGATIVE

## 2020-08-14 ENCOUNTER — Other Ambulatory Visit: Payer: Self-pay

## 2020-08-14 ENCOUNTER — Encounter (HOSPITAL_COMMUNITY)
Admission: RE | Admit: 2020-08-14 | Discharge: 2020-08-14 | Disposition: A | Discharge status: Home / Self Care | Payer: BC Managed Care – PPO | Source: Ambulatory Visit | Attending: General Surgery | Admitting: General Surgery

## 2020-08-14 ENCOUNTER — Encounter (HOSPITAL_COMMUNITY)
Admission: RE | Disposition: A | Discharge status: Home / Self Care | Payer: Self-pay | Source: Home / Self Care | Attending: General Surgery

## 2020-08-14 ENCOUNTER — Ambulatory Visit (HOSPITAL_COMMUNITY)
Admission: RE | Admit: 2020-08-14 | Discharge: 2020-08-14 | Disposition: A | Discharge status: Home / Self Care | Payer: BC Managed Care – PPO | Attending: General Surgery | Admitting: General Surgery

## 2020-08-14 ENCOUNTER — Encounter (HOSPITAL_COMMUNITY): Payer: Self-pay | Admitting: General Surgery

## 2020-08-14 ENCOUNTER — Ambulatory Visit (HOSPITAL_COMMUNITY): Payer: BC Managed Care – PPO | Admitting: Emergency Medicine

## 2020-08-14 ENCOUNTER — Ambulatory Visit (HOSPITAL_COMMUNITY): Payer: BC Managed Care – PPO

## 2020-08-14 ENCOUNTER — Ambulatory Visit
Admission: RE | Admit: 2020-08-14 | Discharge: 2020-08-14 | Disposition: A | Discharge status: Home / Self Care | Payer: BC Managed Care – PPO | Source: Ambulatory Visit | Attending: General Surgery | Admitting: General Surgery

## 2020-08-14 DIAGNOSIS — Z20822 Contact with and (suspected) exposure to covid-19: Secondary | ICD-10-CM | POA: Insufficient documentation

## 2020-08-14 DIAGNOSIS — C50412 Malignant neoplasm of upper-outer quadrant of left female breast: Secondary | ICD-10-CM

## 2020-08-14 DIAGNOSIS — Z170 Estrogen receptor positive status [ER+]: Secondary | ICD-10-CM

## 2020-08-14 DIAGNOSIS — D360 Benign neoplasm of lymph nodes: Secondary | ICD-10-CM | POA: Insufficient documentation

## 2020-08-14 DIAGNOSIS — Z809 Family history of malignant neoplasm, unspecified: Secondary | ICD-10-CM | POA: Insufficient documentation

## 2020-08-14 DIAGNOSIS — Z86010 Personal history of colonic polyps: Secondary | ICD-10-CM | POA: Insufficient documentation

## 2020-08-14 DIAGNOSIS — Z803 Family history of malignant neoplasm of breast: Secondary | ICD-10-CM | POA: Insufficient documentation

## 2020-08-14 HISTORY — PX: BREAST LUMPECTOMY WITH RADIOACTIVE SEED AND SENTINEL LYMPH NODE BIOPSY: SHX6550

## 2020-08-14 HISTORY — PX: AXILLARY SENTINEL NODE BIOPSY: SHX5738

## 2020-08-14 SURGERY — BREAST LUMPECTOMY WITH RADIOACTIVE SEED AND SENTINEL LYMPH NODE BIOPSY
Anesthesia: General | Site: Breast | Laterality: Left

## 2020-08-14 MED ORDER — FENTANYL CITRATE (PF) 100 MCG/2ML IJ SOLN
25.0000 ug | INTRAMUSCULAR | Status: DC | PRN
  Administered 2020-08-14: 50 ug via INTRAVENOUS

## 2020-08-14 MED ORDER — CEFAZOLIN SODIUM-DEXTROSE 2-4 GM/100ML-% IV SOLN
2.0000 g | INTRAVENOUS | Status: AC
  Administered 2020-08-14: 2 g via INTRAVENOUS
  Filled 2020-08-14: qty 100

## 2020-08-14 MED ORDER — BUPIVACAINE-EPINEPHRINE 0.25% -1:200000 IJ SOLN
INTRAMUSCULAR | Status: DC | PRN
  Administered 2020-08-14: 7 mL

## 2020-08-14 MED ORDER — FENTANYL CITRATE (PF) 100 MCG/2ML IJ SOLN
100.0000 ug | Freq: Once | INTRAMUSCULAR | Status: AC
Start: 2020-08-14 — End: 2020-08-14

## 2020-08-14 MED ORDER — MIDAZOLAM HCL 2 MG/2ML IJ SOLN: 2.0000 mg | Freq: Once | INTRAMUSCULAR | Status: AC

## 2020-08-14 MED ORDER — PROPOFOL 10 MG/ML IV BOLUS
INTRAVENOUS | Status: AC
  Filled 2020-08-14: qty 20

## 2020-08-14 MED ORDER — 0.9 % SODIUM CHLORIDE (POUR BTL) OPTIME
TOPICAL | Status: DC | PRN
  Administered 2020-08-14: 1000 mL

## 2020-08-14 MED ORDER — OXYCODONE HCL 5 MG/5ML PO SOLN: 5.0000 mg | Freq: Once | ORAL | Status: AC | PRN

## 2020-08-14 MED ORDER — MIDAZOLAM HCL 2 MG/2ML IJ SOLN
INTRAMUSCULAR | Status: AC
  Administered 2020-08-14: 2 mg via INTRAVENOUS
  Filled 2020-08-14: qty 2

## 2020-08-14 MED ORDER — HEMOSTATIC AGENTS (NO CHARGE) OPTIME
TOPICAL | Status: DC | PRN
  Administered 2020-08-14: 1 via TOPICAL

## 2020-08-14 MED ORDER — ONDANSETRON HCL 4 MG/2ML IJ SOLN
INTRAMUSCULAR | Status: AC
  Filled 2020-08-14: qty 2

## 2020-08-14 MED ORDER — ENSURE PRE-SURGERY PO LIQD: 296.0000 mL | Freq: Once | ORAL | Status: DC

## 2020-08-14 MED ORDER — TECHNETIUM TC 99M TILMANOCEPT KIT
1.0000 | PACK | Freq: Once | INTRAVENOUS | Status: AC | PRN
  Administered 2020-08-14: 1 via INTRADERMAL

## 2020-08-14 MED ORDER — OXYCODONE HCL 5 MG PO TABS
ORAL_TABLET | ORAL | Status: AC
  Filled 2020-08-14: qty 1

## 2020-08-14 MED ORDER — DEXAMETHASONE SODIUM PHOSPHATE 10 MG/ML IJ SOLN
INTRAMUSCULAR | Status: AC
  Filled 2020-08-14: qty 1

## 2020-08-14 MED ORDER — ACETAMINOPHEN 500 MG PO TABS
1000.0000 mg | ORAL_TABLET | ORAL | Status: AC
  Administered 2020-08-14: 1000 mg via ORAL
  Filled 2020-08-14: qty 2

## 2020-08-14 MED ORDER — LIDOCAINE 2% (20 MG/ML) 5 ML SYRINGE
INTRAMUSCULAR | Status: AC
  Filled 2020-08-14: qty 5

## 2020-08-14 MED ORDER — FENTANYL CITRATE (PF) 100 MCG/2ML IJ SOLN
INTRAMUSCULAR | Status: AC
  Administered 2020-08-14: 100 ug via INTRAVENOUS
  Filled 2020-08-14: qty 2

## 2020-08-14 MED ORDER — LACTATED RINGERS IV SOLN: INTRAVENOUS | Status: DC

## 2020-08-14 MED ORDER — METHYLENE BLUE 0.5 % INJ SOLN
INTRAVENOUS | Status: AC
  Filled 2020-08-14: qty 10

## 2020-08-14 MED ORDER — OXYCODONE HCL 5 MG PO TABS
5.0000 mg | ORAL_TABLET | Freq: Once | ORAL | Status: AC | PRN
  Administered 2020-08-14: 5 mg via ORAL

## 2020-08-14 MED ORDER — ONDANSETRON HCL 4 MG/2ML IJ SOLN
INTRAMUSCULAR | Status: DC | PRN
  Administered 2020-08-14: 4 mg via INTRAVENOUS

## 2020-08-14 MED ORDER — OXYCODONE HCL 5 MG PO TABS: 5.0000 mg | ORAL_TABLET | Freq: Four times a day (QID) | ORAL | 0 refills | Status: DC | PRN

## 2020-08-14 MED ORDER — LIDOCAINE HCL (CARDIAC) PF 100 MG/5ML IV SOSY
PREFILLED_SYRINGE | INTRAVENOUS | Status: DC | PRN
  Administered 2020-08-14: 40 mg via INTRATRACHEAL

## 2020-08-14 MED ORDER — ORAL CARE MOUTH RINSE: 15.0000 mL | Freq: Once | OROMUCOSAL | Status: AC

## 2020-08-14 MED ORDER — FENTANYL CITRATE (PF) 100 MCG/2ML IJ SOLN
INTRAMUSCULAR | Status: AC
  Filled 2020-08-14: qty 2

## 2020-08-14 MED ORDER — SODIUM CHLORIDE (PF) 0.9 % IJ SOLN
INTRAMUSCULAR | Status: AC
  Filled 2020-08-14: qty 10

## 2020-08-14 MED ORDER — DEXAMETHASONE SODIUM PHOSPHATE 10 MG/ML IJ SOLN
INTRAMUSCULAR | Status: DC | PRN
  Administered 2020-08-14: 5 mg via INTRAVENOUS

## 2020-08-14 MED ORDER — CHLORHEXIDINE GLUCONATE 0.12 % MT SOLN
15.0000 mL | Freq: Once | OROMUCOSAL | Status: AC
  Administered 2020-08-14: 15 mL via OROMUCOSAL
  Filled 2020-08-14: qty 15

## 2020-08-14 MED ORDER — BUPIVACAINE-EPINEPHRINE (PF) 0.25% -1:200000 IJ SOLN
INTRAMUSCULAR | Status: AC
  Filled 2020-08-14: qty 30

## 2020-08-14 MED ORDER — FENTANYL CITRATE (PF) 250 MCG/5ML IJ SOLN
INTRAMUSCULAR | Status: DC | PRN
  Administered 2020-08-14 (×2): 25 ug via INTRAVENOUS

## 2020-08-14 MED ORDER — PROPOFOL 10 MG/ML IV BOLUS
INTRAVENOUS | Status: DC | PRN
  Administered 2020-08-14: 60 mg via INTRAVENOUS
  Administered 2020-08-14: 140 mg via INTRAVENOUS

## 2020-08-14 MED ORDER — ONDANSETRON HCL 4 MG/2ML IJ SOLN: 4.0000 mg | Freq: Once | INTRAMUSCULAR | Status: DC | PRN

## 2020-08-14 MED ORDER — FENTANYL CITRATE (PF) 250 MCG/5ML IJ SOLN
INTRAMUSCULAR | Status: AC
  Filled 2020-08-14: qty 5

## 2020-08-14 MED ORDER — KETOROLAC TROMETHAMINE 15 MG/ML IJ SOLN
15.0000 mg | INTRAMUSCULAR | Status: AC
  Administered 2020-08-14: 15 mg via INTRAVENOUS
  Filled 2020-08-14: qty 1

## 2020-08-14 SURGICAL SUPPLY — 46 items
APPLIER CLIP 9.375 MED OPEN (MISCELLANEOUS)
BINDER BREAST LRG (GAUZE/BANDAGES/DRESSINGS) IMPLANT
BINDER BREAST XLRG (GAUZE/BANDAGES/DRESSINGS) IMPLANT
BINDER BREAST XXLRG (GAUZE/BANDAGES/DRESSINGS) ×3 IMPLANT
CANISTER SUCT 3000ML PPV (MISCELLANEOUS) ×3 IMPLANT
CHLORAPREP W/TINT 26 (MISCELLANEOUS) ×3 IMPLANT
CLIP APPLIE 9.375 MED OPEN (MISCELLANEOUS) IMPLANT
CLIP VESOCCLUDE MED 6/CT (CLIP) ×3 IMPLANT
CLOSURE STERI-STRIP 1/4X4 (GAUZE/BANDAGES/DRESSINGS) ×3 IMPLANT
COVER PROBE W GEL 5X96 (DRAPES) ×3 IMPLANT
COVER SURGICAL LIGHT HANDLE (MISCELLANEOUS) ×3 IMPLANT
COVER WAND RF STERILE (DRAPES) ×3 IMPLANT
DERMABOND ADVANCED (GAUZE/BANDAGES/DRESSINGS) ×1
DERMABOND ADVANCED .7 DNX12 (GAUZE/BANDAGES/DRESSINGS) ×2 IMPLANT
DEVICE DUBIN SPECIMEN MAMMOGRA (MISCELLANEOUS) ×3 IMPLANT
DRAPE CHEST BREAST 15X10 FENES (DRAPES) ×3 IMPLANT
ELECT COATED BLADE 2.86 ST (ELECTRODE) ×3 IMPLANT
ELECT REM PT RETURN 9FT ADLT (ELECTROSURGICAL) ×3
ELECTRODE REM PT RTRN 9FT ADLT (ELECTROSURGICAL) ×2 IMPLANT
GLOVE BIO SURGEON STRL SZ7 (GLOVE) ×3 IMPLANT
GLOVE BIOGEL PI IND STRL 7.5 (GLOVE) ×2 IMPLANT
GLOVE BIOGEL PI INDICATOR 7.5 (GLOVE) ×1
GOWN STRL REUS W/ TWL LRG LVL3 (GOWN DISPOSABLE) ×4 IMPLANT
GOWN STRL REUS W/TWL LRG LVL3 (GOWN DISPOSABLE) ×2
HEMOSTAT ARISTA ABSORB 3G PWDR (HEMOSTASIS) ×3 IMPLANT
ILLUMINATOR WAVEGUIDE N/F (MISCELLANEOUS) IMPLANT
KIT BASIN OR (CUSTOM PROCEDURE TRAY) ×3 IMPLANT
KIT MARKER MARGIN INK (KITS) ×3 IMPLANT
NEEDLE 18GX1X1/2 (RX/OR ONLY) (NEEDLE) IMPLANT
NEEDLE FILTER BLUNT 18X 1/2SAF (NEEDLE)
NEEDLE FILTER BLUNT 18X1 1/2 (NEEDLE) IMPLANT
NEEDLE HYPO 25GX1X1/2 BEV (NEEDLE) ×3 IMPLANT
NS IRRIG 1000ML POUR BTL (IV SOLUTION) ×3 IMPLANT
PACK GENERAL/GYN (CUSTOM PROCEDURE TRAY) ×3 IMPLANT
RETRACTOR ONETRAX LX 90X20 (MISCELLANEOUS) ×3 IMPLANT
STRIP CLOSURE SKIN 1/2X4 (GAUZE/BANDAGES/DRESSINGS) ×3 IMPLANT
SUT MNCRL AB 4-0 PS2 18 (SUTURE) ×6 IMPLANT
SUT MON AB 5-0 PS2 18 (SUTURE) IMPLANT
SUT SILK 2 0 SH (SUTURE) IMPLANT
SUT VIC AB 2-0 SH 27 (SUTURE) ×4
SUT VIC AB 2-0 SH 27XBRD (SUTURE) ×8 IMPLANT
SUT VIC AB 3-0 SH 27 (SUTURE) ×2
SUT VIC AB 3-0 SH 27X BRD (SUTURE) ×4 IMPLANT
SYR CONTROL 10ML LL (SYRINGE) ×3 IMPLANT
TOWEL GREEN STERILE (TOWEL DISPOSABLE) ×3 IMPLANT
TOWEL GREEN STERILE FF (TOWEL DISPOSABLE) ×3 IMPLANT

## 2020-08-14 NOTE — Op Note (Signed)
Preoperative diagnosis: Clinical stage I left breast cancer Postoperative diagnosis: Same as above Procedure: 1. Left breast radioactive seed guided lumpectomy 2. Left deep axillary sentinel lymph node biopsy Surgeon: Dr. Serita Grammes Anesthesia: General with a pectoral block Estimated blood loss: Minimal Complications: None Drains: None Specimens: 1. Left breast tissue marked with paint containing seed and clip 2. Axillary sentinel nodes with highest count of 588 Sponge count was correct at completion Disposition to recovery stable addition  Indications: 72 yofhad screening mm that shows c density breasts. she had a left breast asymmetry. diagnostic views showed it to be persistent. [name] over the mass showed a 3x3x5 mm mass. axillary nodes are normal. biopsy with clip placement was done. 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 she is here with her daughter to discuss options  Procedure: After informed consent was obtained the patient first underwent a pectoral block. She had SCDs in place. She was given antibiotics. She was then placed under general anesthesia without complication. She was prepped and draped in the standard sterile surgical fashion. Surgical timeout was then performed.  I did this all via a left upper outer quadrant incision.   I then dissected to the seed in the anterior plane.  I then proceeded to remove the seed with neoprobe guidance with an attempt to remove a rim of normal tissue around it. This was then passed off the table. The posterior margin is now the muscle. I obtained hemostasis. I mobilized the tissue to bring the breast tissue together. I did this with 2-0 Vicryl. I placed a clip in the cavity.  I located a sentinel node in the low axilla. I dissected through the axillary fascia. I was able to identify what appeared to be a couple of sentinel lymph nodes with the highest count  of 588. The background radioactivity was less than 10. There were no other palpable nodes. I passed these off the table. I then obtained hemostasis. I then closed the axillary fascia with 2-0 Vicryl. I closed the breast tissue with 2-0 vicryl also. The skin was closed with 3-0 Vicryl and 4-0 Monocryl. Glue and Steri-Strips were applied. She tolerated this well was extubated transferred to recovery stable.

## 2020-08-14 NOTE — H&P (Signed)
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 no prior breast history. has fh of brst cancer in [place] age 55 and pancreatic cancer in maternal aunt. she had no mass or dc. had screening mm that shows c density breasts. she had a left breast asymmetry. diagnostic views showed it to be persistent. [name] over the mass showed a 3x3x5 mm mass. axillary nodes are normal. biopsy with clip placement was done. 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 she is here with her daughter to discuss options  Past Surgical History Jess Barters, CMA; 07/29/2020 10:31 AM) Cesarean Section - 1  Colon Polyp Removal - Colonoscopy  Oral Surgery  Tonsillectomy   Allergies Jess Barters, CMA; 07/29/2020 10:31 AM) No Known Drug Allergies  [07/29/2020]: Allergies Reconciled   Medication History Jess Barters, CMA; 07/29/2020 10:32 AM) Amoxicillin-Pot Clavulanate (875-125MG  Tablet, Oral) Active. Azithromycin (250MG  Tablet, Oral) Active. Cetirizine HCl (10MG  Tablet, Oral) Active. Estrogel (0.75 MG/1.25 GM(0.06%) Gel, Transdermal) Active. Famotidine (40MG  Tablet, Oral) Active. miSOPROStol (200MCG Tablet, Oral) Active. Medications Reconciled  Social History Jess Barters, CMA; 07/29/2020 10:31 AM) Caffeine use  Carbonated beverages, Coffee, Tea. No alcohol use  No drug use  Tobacco use  Never smoker.  Family History Jess Barters, [place]; 07/29/2020 10:31 AM) Alcohol Abuse  Brother. Breast Cancer  Family Members In General. Cancer  Family Members In General. Cerebrovascular Accident  Family Members In General, Father. Diabetes Mellitus  Mother. Heart Disease  Mother. Hypertension  Family Members In General, Father, Mother. Kidney Disease  Family Members In General. Thyroid problems  Family Members In General, Mother.   Review of Systems Jess Barters CMA; 07/29/2020 10:31 AM) General Present- Fatigue. Not Present- Appetite Loss,  Chills, Fever, Night Sweats, Weight Gain and Weight Loss. Skin Present- Hives and Rash. Not Present- Change in Wart/Mole, Dryness, Jaundice, New Lesions, Non-Healing Wounds and Ulcer. HEENT Present- Seasonal Allergies and Wears glasses/contact lenses. Not Present- Earache, Hearing Loss, Hoarseness, Nose Bleed, Oral Ulcers, Ringing in the Ears, Sinus Pain, Sore Throat, Visual Disturbances and Yellow Eyes. Respiratory Present- Snoring. Not Present- Bloody sputum, Chronic Cough, Difficulty Breathing and Wheezing. Breast Present- Breast Mass. Not Present- Breast Pain, Nipple Discharge and Skin Changes. Cardiovascular Present- Palpitations. Not Present- Chest Pain, Difficulty Breathing Lying Down, Leg Cramps, Rapid Heart Rate, Shortness of Breath and Swelling of Extremities. Gastrointestinal Present- Bloating and Hemorrhoids. Not Present- Abdominal Pain, Bloody Stool, Change in Bowel Habits, Chronic diarrhea, Constipation, Difficulty Swallowing, Excessive gas, Gets full quickly at meals, Indigestion, Nausea, Rectal Pain and Vomiting. Musculoskeletal Present- Joint Pain. Not Present- Back Pain, Joint Stiffness, Muscle Pain, Muscle Weakness and Swelling of Extremities. Endocrine Present- Hot flashes. Not Present- Cold Intolerance, Excessive Hunger, Hair Changes, Heat Intolerance and New Diabetes.  Vitals Jess Barters CMA; 07/29/2020 10:32 AM) 07/29/2020 10:32 AM Weight: 190.38 lb Height: 65in Body Surface Area: 1.94 m Body Mass Index: 31.68 kg/m  Temp.: 93.51F  Pulse: 102 (Regular)  P.OX: 98% (Room air) BP: 140/80(Sitting, Left Arm, Standard) Physical Exam Rolm Bookbinder MD; 07/29/2020 10:48 AM) General Mental Status-Alert. Orientation-Oriented X3. Breast Nipples-No Discharge. Breast Lump-No Palpable Breast Mass. Lymphatic Head & Neck General Head & Neck Lymphatics: Bilateral - Description - Normal. Axillary General Axillary Region: Bilateral - Description - Normal. Note:  no Ozan adenopathy   Assessment & Plan Rolm Bookbinder MD; 07/29/2020 1:34 PM) BREAST CANCER OF UPPER-OUTER QUADRANT OF LEFT FEMALE BREAST (C50.412)  left breast seed guided lumpectomy, left  axillary sentinel node biopsy we discussed genetics, discussed insurance and pos/neg/vus results today. agreed to testing. We discussed the staging and pathophysiology of breast cancer. We discussed all of the different options for treatment for breast cancer including surgery, chemotherapy, radiation therapy, Herceptin, and antiestrogen therapy. We discussed a sentinel lymph node biopsy as she does not appear to having lymph node involvement right now. We discussed the performance of that with injection of radioactive tracer. We discussed that there is a chance of having a positive node with a sentinel lymph node biopsy and we will await the permanent pathology to make any other first further decisions in terms of her treatment. We discussed up to a 5% risk lifetime of chronic shoulder pain as well as lymphedema associated with a sentinel lymph node biopsy. We discussed the options for treatment of the breast cancer which included lumpectomy versus a mastectomy. We discussed the performance of the lumpectomy with radioactive seed placement. We discussed a 5-10% chance of a positive margin requiring reexcision in the operating room. We also discussed that she will likely need radiation therapy if she undergoes lumpectomy. We discussed mastectomy and the postoperative care for that as well. Mastectomy can be followed by reconstruction. The decision for lumpectomy vs mastectomy has no impact on decision for chemotherapy. Most mastectomy patients will not need radiation therapy. We discussed that there is no difference in her survival whether she undergoes lumpectomy with radiation therapy or antiestrogen therapy versus a mastectomy. There is also no real difference between her recurrence in the breast. We discussed the  risks of operation including bleeding, infection, possible reoperation. She understands her further therapy will be based on what her stages at the time of her operation.

## 2020-08-14 NOTE — Anesthesia Procedure Notes (Signed)
Anesthesia Regional Block: Pectoralis block   Pre-Anesthetic Checklist: ,, timeout performed, Correct Patient, Correct Site, Correct Laterality, Correct Procedure, Correct Position, site marked, Risks and benefits discussed,  Surgical consent,  Pre-op evaluation,  At surgeon's request and post-op pain management  Laterality: Left  Prep: chloraprep       Needles:  Injection technique: Single-shot  Needle Type: Echogenic Needle     Needle Length: 9cm  Needle Gauge: 21     Additional Needles:   Narrative:  Start time: 08/14/2020 8:30 AM End time: 08/14/2020 8:40 AM Injection made incrementally with aspirations every 5 mL.  Performed by: Personally  Anesthesiologist: Roberts Gaudy, MD  Additional Notes: 25 cc 0.25% Bupivacaine  10 cc 1.3% Exparel

## 2020-08-14 NOTE — Transfer of Care (Signed)
Immediate Anesthesia Transfer of Care Note  Patient: Holly Allison  Procedure(s) Performed: LEFT BREAST LUMPECTOMY WITH RADIOACTIVE SEED (Left Breast) LEFT AXILLARY SENTINEL NODE BIOPSY (Left Axilla)  Patient Location: PACU  Anesthesia Type:General  Level of Consciousness: drowsy  Airway & Oxygen Therapy: Patient Spontanous Breathing and Patient connected to face mask oxygen  Post-op Assessment: Report given to RN and Post -op Vital signs reviewed and stable  Post vital signs: Reviewed and stable  Last Vitals:  Vitals Value Taken Time  BP 131/91 08/14/20 1056  Temp    Pulse 82 08/14/20 1057  Resp 18 08/14/20 1057  SpO2 98 % 08/14/20 1057  Vitals shown include unvalidated device data.  Last Pain:  Vitals:   08/14/20 0702  TempSrc:   PainSc: 0-No pain         Complications: No complications documented.

## 2020-08-14 NOTE — Anesthesia Postprocedure Evaluation (Signed)
Anesthesia Post Note  Patient: Holly Allison  Procedure(s) Performed: LEFT BREAST LUMPECTOMY WITH RADIOACTIVE SEED (Left Breast) LEFT AXILLARY SENTINEL NODE BIOPSY (Left Axilla)     Patient location during evaluation: PACU Anesthesia Type: General Level of consciousness: awake and alert Pain management: pain level controlled Vital Signs Assessment: post-procedure vital signs reviewed and stable Respiratory status: spontaneous breathing, nonlabored ventilation, respiratory function stable and patient connected to nasal cannula oxygen Cardiovascular status: blood pressure returned to baseline and stable Postop Assessment: no apparent nausea or vomiting Anesthetic complications: no   No complications documented.  Last Vitals:  Vitals:   08/14/20 1126 08/14/20 1130  BP: (!) 130/99   Pulse: (!) 59 76  Resp: 12 14  Temp:    SpO2: 99% 98%    Last Pain:  Vitals:   08/14/20 1126  TempSrc:   PainSc: 3                  Grenda Lora COKER

## 2020-08-14 NOTE — Anesthesia Procedure Notes (Signed)
Procedure Name: LMA Insertion Date/Time: 08/14/2020 9:37 AM Performed by: Candis Shine, CRNA Pre-anesthesia Checklist: Patient identified, Emergency Drugs available, Suction available and Patient being monitored Patient Re-evaluated:Patient Re-evaluated prior to induction Oxygen Delivery Method: Circle System Utilized Preoxygenation: Pre-oxygenation with 100% oxygen Induction Type: IV induction Ventilation: Mask ventilation without difficulty LMA: LMA inserted LMA Size: 4.0 Number of attempts: 1 Placement Confirmation: positive ETCO2 Tube secured with: Tape Dental Injury: Teeth and Oropharynx as per pre-operative assessment

## 2020-08-14 NOTE — Interval H&P Note (Signed)
History and Physical Interval Note:  08/14/2020 8:32 AM  Holly Allison  has presented today for surgery, with the diagnosis of LEFT BREAST CANCER.  The various methods of treatment have been discussed with the patient and family. After consideration of risks, benefits and other options for treatment, the patient has consented to  Procedure(s): LEFT BREAST LUMPECTOMY WITH RADIOACTIVE SEED AND LEFT AXILLARY SENTINEL LYMPH NODE BIOPSY (Left) as a surgical intervention.  The patient's history has been reviewed, patient examined, no change in status, stable for surgery.  I have reviewed the patient's chart and labs.  Questions were answered to the patient's satisfaction.     Rolm Bookbinder

## 2020-08-14 NOTE — Discharge Instructions (Signed)
[facility] Office Phone Number [phone]  BREAST BIOPSY/ PARTIAL MASTECTOMY: POST OP INSTRUCTIONS Take 400 mg of ibuprofen every 8 hours or 650 mg tylenol every 6 hours for next 72 hours then as needed. Use ice several times daily also. Always review your discharge instruction sheet given to you by the facility where your surgery was performed.  IF YOU HAVE DISABILITY OR FAMILY LEAVE FORMS, YOU MUST BRING THEM TO THE OFFICE FOR PROCESSING.  DO NOT GIVE THEM TO YOUR DOCTOR.  1. A prescription for pain medication may be given to you upon discharge.  Take your pain medication as prescribed, if needed.  If narcotic pain medicine is not needed, then you may take acetaminophen (Tylenol), naprosyn (Alleve) or ibuprofen (Advil) as needed. 2. Take your usually prescribed medications unless otherwise directed 3. If you need a refill on your pain medication, please contact your pharmacy.  They will contact our office to request authorization.  Prescriptions will not be filled after 5pm or on week-ends. 4. You should eat very light the first 24 hours after surgery, such as soup, crackers, pudding, etc.  Resume your normal diet the day after surgery. 5. Most patients will experience some swelling and bruising in the breast.  Ice packs and a good support bra will help.  Wear the breast binder provided or a sports bra for 72 hours day and night.  After that wear a sports bra during the day until you return to the office. Swelling and bruising can take several days to resolve.  6. It is common to experience some constipation if taking pain medication after surgery.  Increasing fluid intake and taking a stool softener will usually help or prevent this problem from occurring.  A mild laxative (Milk of Magnesia or Miralax) should be taken according to package directions if there are no bowel movements after 48 hours. 7. Unless discharge instructions indicate otherwise, you may remove your bandages 48  hours after surgery and you may shower at that time.  You may have steri-strips (small skin tapes) in place directly over the incision.  These strips should be left on the skin for 7-10 days and will come off on their own.  If your surgeon used skin glue on the incision, you may shower in 24 hours.  The glue will flake off over the next 2-3 weeks.  Any sutures or staples will be removed at the office during your follow-up visit. 8. ACTIVITIES:  You may resume regular daily activities (gradually increasing) beginning the next day.  Wearing a good support bra or sports bra minimizes pain and swelling.  You may have sexual intercourse when it is comfortable. a. You may drive when you no longer are taking prescription pain medication, you can comfortably wear a seatbelt, and you can safely maneuver your car and apply brakes. b. RETURN TO WORK:  ______________________________________________________________________________________ 9. You should see your doctor in the office for a follow-up appointment approximately two weeks after your surgery.  Your doctor's nurse will typically make your follow-up appointment when she calls you with your pathology report.  Expect your pathology report 3-4 business days after your surgery.  You may call to check if you do not hear from us after three days. 10. OTHER INSTRUCTIONS: _______________________________________________________________________________________________ _____________________________________________________________________________________________________________________________________ _____________________________________________________________________________________________________________________________________ _____________________________________________________________________________________________________________________________________  WHEN TO CALL DR Samon Dishner: 1. Fever over 101.0 2. Nausea and/or vomiting. 3. Extreme swelling or  bruising. 4. Continued bleeding from incision. 5. Increased pain, redness, or drainage from the incision.  The clinic   staff is available to answer your questions during regular business hours.  Please don't hesitate to call and ask to speak to one of the nurses for clinical concerns.  If you have a medical emergency, go to the nearest emergency room or call 911.  A surgeon from [facility] is always on call at the hospital.  For further questions, please visit [email or web address]  

## 2020-08-15 ENCOUNTER — Encounter (HOSPITAL_COMMUNITY): Payer: Self-pay | Admitting: General Surgery

## 2020-08-20 ENCOUNTER — Encounter: Payer: Self-pay | Admitting: *Deleted

## 2020-08-20 DIAGNOSIS — C50412 Malignant neoplasm of upper-outer quadrant of left female breast: Secondary | ICD-10-CM

## 2020-08-20 DIAGNOSIS — Z170 Estrogen receptor positive status [ER+]: Secondary | ICD-10-CM

## 2020-08-20 LAB — SURGICAL PATHOLOGY

## 2020-08-20 NOTE — Progress Notes (Signed)
Patient Care Team: Sasser, Silvestre Moment, MD as PCP - General (Cardiology) Mauro Kaufmann, RN as Oncology Nurse Navigator Rockwell Germany, RN as Oncology Nurse Navigator  DIAGNOSIS:    ICD-10-CM   1. Malignant neoplasm of upper-outer quadrant of left breast in female, estrogen receptor positive ([place])  C50.412    Z17.0     SUMMARY OF ONCOLOGIC HISTORY: Oncology History  Malignant neoplasm of upper-outer quadrant of left breast in female, estrogen receptor positive ([place])  07/16/2020 Initial Diagnosis   Screening mammogram detected possible asymmetries in the bilateral breasts. 0.5cm mass at the 2:30 position in the left breast, with no left axillary adenopathy, and no focal abnormalities in the right breast. Biopsy showed invasive and in situ ductal carcinoma, grade 1, strong ER/PR positive, HER-2 negative, Ki67 <5%   08/05/2020 Cancer Staging   Staging form: Breast, AJCC 8th Edition - Clinical stage from 08/05/2020: Stage IA (cT1a, cN0, cM0, G1, ER+, PR+, HER2-) - Signed by Nicholas Lose, MD on 08/05/2020 Stage prefix: Initial diagnosis Histologic grading system: 3 grade system   08/07/2020 Genetic Testing   Negative genetic testing on the Common hereditary cancer panel.  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  The Common Hereditary Gene Panel offered by [facility] includes sequencing and/or deletion duplication testing of the following 47 genes: APC, ATM, AXIN2, BARD1, BMPR1A, BRCA1, BRCA2, BRIP1, CDH1, CDK4, CDKN2A (p14ARF), CDKN2A (p16INK4a), CHEK2, CTNNA1, DICER1, EPCAM (Deletion/duplication testing only), GREM1 (promoter region deletion/duplication testing only), KIT, MEN1, MLH1, MSH2, MSH3, MSH6, MUTYH, NBN, NF1, NHTL1, PALB2, PDGFRA, PMS2, POLD1, POLE, PTEN, RAD50, RAD51C, RAD51D, SDHB, SDHC, SDHD, SMAD4, SMARCA4. STK11, TP53, TSC1, TSC2, and VHL.  The following genes were evaluated for sequence changes only: SDHA and HOXB13 c.251G>A variant only. The report date is August 07, 2020.    08/14/2020 Surgery   Left lumpectomy [name]): invasive and in situ lobular carcinoma, 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      CHIEF COMPLIANT: Follow-up s/p left lumpectomy   INTERVAL HISTORY: AMILLIANA HAYWORTH is a 55 y.o. with above-mentioned history of left breast cancer. 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 She presents to the clinic today to review the pathology report and further treatment.   ALLERGIES:  has No Known Allergies.  MEDICATIONS:  Current Outpatient Medications  Medication Sig Dispense Refill  . cetirizine (ZYRTEC) 10 MG tablet Take 10 mg by mouth daily.    . famotidine (PEPCID) 40 MG tablet Take 40 mg by mouth 2 (two) times daily.    Marland Kitchen oxyCODONE (OXY IR/ROXICODONE) 5 MG immediate release tablet Take 1 tablet (5 mg total) by mouth every 6 (six) hours as needed for moderate pain, severe pain or breakthrough pain. 10 tablet 0   No current facility-administered medications for this visit.    PHYSICAL EXAMINATION: ECOG PERFORMANCE STATUS: 1 - Symptomatic but completely ambulatory  Vitals:   08/21/20 1352  BP: 129/76  Pulse: 82  Resp: 16  Temp: (!) 97.3 F (36.3 C)  SpO2: 98%   Filed Weights   08/21/20 1352  Weight: 190 lb 14.4 oz (86.6 kg)    LABORATORY DATA:  I have reviewed the data as listed CMP Latest Ref Rng & Units 08/06/2020 07/29/2015 09/01/2014  Glucose 70 - 99 mg/dL 95 128(H) 154(H)  BUN 6 - 20 mg/dL _0 Creatinine 0.44 - 1.00 mg/dL  0.79 0.83 0.73  Sodium 135 - 145 mmol/L 138 143 141  Potassium 3.5 - 5.1 mmol/L 3.8 3.9 3.8  Chloride 98 - 111 mmol/L 102 104 104  CO2 22 - 32 mmol/L _0 Calcium 8.9 - 10.3 mg/dL 9.3 9.4 9.2  Total Protein 6.1 - 8.1 g/dL - 6.9 -  Total Bilirubin 0.2 - 1.2 mg/dL - 0.5 -  Alkaline Phos 33 - 115 U/L - 47 -  AST 10 - 35  U/L - 15 -  ALT 6 - 29 U/L - 14 -    Lab Results  Component Value Date   WBC 7.7 08/06/2020   HGB 12.8 08/06/2020   HCT 37.7 08/06/2020   MCV 91.7 08/06/2020   PLT 301 08/06/2020   NEUTROABS 3,233 07/29/2015    ASSESSMENT & PLAN:  Malignant neoplasm of upper-outer quadrant of left breast in female, estrogen receptor positive ([place]) 08/14/2020:Left lumpectomy [name]): invasive and in situ lobular carcinoma, 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 grade 1, strong ER 90%/PR 95% positive, HER-2 negative, Ki67 <5%  Pathology counseling: I discussed the final pathology report of the patient provided  a copy of this report. I discussed the margins as well as lymph node surgeries. We also discussed the final staging along with previously performed ER/PR and HER-2/neu testing.  Treatment plan: 1. Adjuvant radiation at [facility] 2. Adjuvant antiestrogen therapy  Based on favorable characteristics, tumor being grade 1 and less than a centimeter I did not recommend Oncotype DX testing.  Return to clinic after radiation is complete. I filled out her disability paperwork for short-term disability.   No orders of the defined types were placed in this encounter.  The patient has a good understanding of the overall plan. she agrees with it. she will call with any problems that may develop before the next visit here.  Total time spent: 20 mins including face to face time and time spent for planning, charting and coordination of care  Rulon Eisenmenger, MD, MPH 08/21/2020  I, Molly Dorshimer, am acting as scribe for Dr. Nicholas Lose.  I have reviewed the above documentation for accuracy and completeness, and I agree with the above.

## 2020-08-21 ENCOUNTER — Encounter: Payer: Self-pay | Admitting: *Deleted

## 2020-08-21 ENCOUNTER — Other Ambulatory Visit: Payer: Self-pay

## 2020-08-21 ENCOUNTER — Inpatient Hospital Stay: Payer: BC Managed Care – PPO | Attending: Hematology and Oncology | Admitting: Hematology and Oncology

## 2020-08-21 DIAGNOSIS — C50412 Malignant neoplasm of upper-outer quadrant of left female breast: Secondary | ICD-10-CM

## 2020-08-21 DIAGNOSIS — Z170 Estrogen receptor positive status [ER+]: Secondary | ICD-10-CM | POA: Insufficient documentation

## 2020-08-21 NOTE — Assessment & Plan Note (Signed)
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 grade 1, strong ER 90%/PR 95% positive, HER-2 negative, Ki67 <5%  Pathology counseling: I discussed the final pathology report of the patient provided  a copy of this report. I discussed the margins as well as lymph node surgeries. We also discussed the final staging along with previously performed ER/PR and HER-2/neu testing.  Treatment plan: 1. Adjuvant radiation followed by 2. Adjuvant antiestrogen therapy  Based on favorable characteristics, tumor being grade 1 and less than a centimeter I did not recommend Oncotype DX testing.  Return to clinic after radiation is complete.

## 2020-09-04 ENCOUNTER — Encounter: Payer: Self-pay | Admitting: Rehabilitation

## 2020-09-18 ENCOUNTER — Encounter: Payer: Self-pay | Admitting: *Deleted

## 2020-09-18 ENCOUNTER — Encounter: Payer: Self-pay | Admitting: Rehabilitation

## 2020-09-29 ENCOUNTER — Encounter: Payer: Self-pay | Admitting: *Deleted

## 2020-10-02 ENCOUNTER — Ambulatory Visit: Payer: BC Managed Care – PPO | Attending: General Surgery

## 2020-10-02 ENCOUNTER — Other Ambulatory Visit: Payer: Self-pay

## 2020-10-02 ENCOUNTER — Telehealth (HOSPITAL_COMMUNITY): Payer: Self-pay | Admitting: Physical Therapy

## 2020-10-02 DIAGNOSIS — Z170 Estrogen receptor positive status [ER+]: Secondary | ICD-10-CM | POA: Insufficient documentation

## 2020-10-02 DIAGNOSIS — C50911 Malignant neoplasm of unspecified site of right female breast: Secondary | ICD-10-CM | POA: Diagnosis present

## 2020-10-02 DIAGNOSIS — N6489 Other specified disorders of breast: Secondary | ICD-10-CM | POA: Insufficient documentation

## 2020-10-02 DIAGNOSIS — R293 Abnormal posture: Secondary | ICD-10-CM | POA: Insufficient documentation

## 2020-10-02 NOTE — Patient Instructions (Signed)
Pt was instructed in self breast MLD and was given illustrated and written instructions

## 2020-10-02 NOTE — Telephone Encounter (Signed)
She is having Radiation at 2pm and these time will not work that were offered per Raeanne Gathers- Patient will call us to schedule at a later time since she is transfering from [facility]

## 2020-10-02 NOTE — Therapy (Signed)
[place], [place], [address] Phone: [phone]   Fax:  [phone]  Physical Therapy Treatment  Patient Details  Name: Holly Allison MRN: [id number] Date of Birth: Feb 09, 1966 Referring Provider (PT): Dr. [name]   Encounter Date: 10/02/2020   PT End of Session - 10/02/20 1246     Visit Number 2    Number of Visits 10    Date for PT Re-Evaluation 10/30/20    PT Start Time 1000    PT Stop Time 1056    PT Time Calculation (min) 56 min    Activity Tolerance Patient tolerated treatment well    Behavior During Therapy [facility] for tasks assessed/performed             Past Medical History:  Diagnosis Date   Angio-edema    Cancer ([place])    Family history of adverse reaction to anesthesia    Mother    Heart murmur    Since childhood   Pre-diabetes    Patient states she was told she is prediabetic at yearly physical two months ago. Patient states her A1 C was around 5.6   Urticaria     Past Surgical History:  Procedure Laterality Date   AXILLARY SENTINEL NODE BIOPSY Left 08/14/2020   Procedure: LEFT AXILLARY SENTINEL NODE BIOPSY;  Surgeon: Rolm Bookbinder, MD;  Location: [place];  Service: General;  Laterality: Left;   BREAST LUMPECTOMY WITH RADIOACTIVE SEED AND SENTINEL LYMPH NODE BIOPSY Left 08/14/2020   Procedure: LEFT BREAST LUMPECTOMY WITH RADIOACTIVE SEED;  Surgeon: Rolm Bookbinder, MD;  Location: [place];  Service: General;  Laterality: Left;   CESAREAN SECTION     TONSILLECTOMY      There were no vitals filed for this visit.   Subjective Assessment - 10/02/20 0958     Subjective I have twinges of pain in the breast that are intermittent.  I had thumping pain in the breast most of the day yesterday but didn't take anything for it.  The nipple area has looked really white 4-5 times in the last week or 2. Will see Dr. Lindi Adie tomorrow and I will ask him.  Has had 2 treatments of radiation  in [place]. I have been doing the exercises as instructed. My breast feels heavy on the left    Pertinent History New diagnosis of Left breast cancer with scheduled lumpectomy with SLNB with radiation.  Cancer is ER positive.  No other health problems. Surgery performed on 08/14/2020, 0/4 LN's Invasive and in situLobular carcinoma.  She will have radiation in [place].    Patient Stated Goals Check arms after surgery, breast swelling    Currently in Pain? No/denies    Multiple Pain Sites No                [facility] PT Assessment - 10/02/20 0001       Assessment   Medical Diagnosis left breast cancer    Referring Provider (PT) Dr. [name]    Onset Date/Surgical Date 08/05/20    Hand Dominance Right      Precautions   Precaution Comments lymphedema risk      Observation/Other Assessments   Observations pt with mild left breast redness around the incision but noticeable left breast swelling present since sx per pt. incision healing well      AROM   AROM Assessment Site Shoulder    Right/Left Shoulder Left    Left Shoulder Extension 64 Degrees    Left Shoulder  Flexion 155 Degrees    Left Shoulder ABduction 165 Degrees    Left Shoulder Internal Rotation 84 Degrees    Left Shoulder External Rotation 107 Degrees      Palpation   Palpation comment tenderness at lateral breast               LYMPHEDEMA/ONCOLOGY QUESTIONNAIRE - 10/02/20 0001       Surgeries   Lumpectomy Date 08/14/20    Sentinel Lymph Node Biopsy Date 08/14/20    Number Lymph Nodes Removed 4      Treatment   Active Chemotherapy Treatment No    Past Chemotherapy Treatment No    Active Radiation Treatment Yes    Date 09/30/20    Body Site breast/axilla    Past Radiation Treatment No    Current Hormone Treatment --   will start   Past Hormone Therapy No      What other symptoms do you have   Are you Having Heaviness or Tightness No    Are you having Pain Yes   intermittent     Right Upper Extremity  Lymphedema   15 cm Proximal to Olecranon Process 36 cm    Olecranon Process 29.9 cm    10 cm Proximal to Ulnar Styloid Process 24.4 cm    Just Proximal to Ulnar Styloid Process 17.7 cm    At Base of 2nd Digit 6.8 cm      Left Upper Extremity Lymphedema   15 cm Proximal to Olecranon Process 36.3 cm    Olecranon Process 29.6 cm    10 cm Proximal to Ulnar Styloid Process 23.8 cm    Just Proximal to Ulnar Styloid Process 17.6 cm    At Base of 2nd Digit 6.9 cm                        [facility] Adult PT Treatment/Exercise - 10/02/20 0001       Manual Therapy   Manual therapy comments pt educated to start wearing her compression bra with the foam that she was given today    Edema Management Compression foam cut and placed in stockinette to place under left breast and lateral to decrease swelling    Manual Lymphatic Drainage (MLD) Pt was educated in and instructed in self breast MLD including short neck,activation of both axillary LN's, left inguinal nodes, anterior interaxillary pathway, left axilloinguinal pathway and left breast directing medial breast to interaxillary pathway and lateral breast to axillo-inguinal pathway and ending with LN's again                    PT Education - 10/02/20 1245     Education Details compression bra, swell sport, self MLD, continue shoulder ROM    Person(s) Educated Patient    Methods Demonstration;Handout;Verbal cues    Comprehension Returned demonstration;Verbalized understanding;Need further instruction                 PT Long Term Goals - 10/02/20 1258       PT LONG TERM GOAL #1   Title Pt will demonstrate return to baseline AROM    Time 6    Period Weeks    Status Achieved    Target Date 10/02/20      PT LONG TERM GOAL #3   Title Pt will attend the virtual ABC class    Time 4    Period Weeks    Status New    Target Date  10/30/20      PT LONG TERM GOAL #4   Title Pt will be independent in self breast MLD     Time 4    Period Weeks    Status New    Target Date 10/30/20      PT LONG TERM GOAL #5   Title Pt will have decreased left breast heaviness/swelling by 50%    Time 4    Period Weeks    Status New    Target Date 10/02/20      Additional Long Term Goals   Additional Long Term Goals Yes                   Plan - 10/02/20 1247     Clinical Impression Statement Pt presents for post surgical reassessment.  Shoulder ROM was returned to her baseline but she may still benefit from some end range stretching.  Circumferences of both arms are slightly increased but nothing indicative of lymphedema. She does present with significant left breast swelling which she indicates has been present since surgery.  She was instructed to wear a compression bra that she has at home and will put the foam in it that was cut for her today.  She was instructed in self MLD to the left breast today and did quite well but will need review.  She was given the phone number for [facility]) to schedule appts closer to home with Myriam Jacobson.    Personal Factors and Comorbidities Comorbidity 2    Comorbidities Left breast CAncer with SLNB, Radiation, breast swelling    Stability/Clinical Decision Making Stable/Uncomplicated    Rehab Potential Excellent    PT Frequency 2x / week    PT Duration 4 weeks    PT Treatment/Interventions ADLs/Self Care Home Management;Therapeutic exercise;Manual lymph drainage;Scar mobilization;Passive range of motion;Patient/family education;Manual techniques    PT Next Visit Plan sign up ABC class(virtual) call [place] to schedule), schedule [facility] in [place] 3 months after surgery,MLD, instruct  self breast MLD, recheck for cording    PT Home Exercise Plan foam in compression bra, Self MLD    Consulted and Agree with Plan of Care Patient;Family member/caregiver             Patient will benefit from skilled therapeutic intervention in order to improve the following  deficits and impairments:  Postural dysfunction, Decreased knowledge of precautions, Increased edema, Pain  Visit Diagnosis: Abnormal posture  Malignant neoplasm of right breast in female, estrogen receptor positive, unspecified site of breast ([place])  Edema of breast     Problem List Patient Active Problem List   Diagnosis Date Noted   Genetic testing 08/08/2020   Malignant neoplasm of upper-outer quadrant of left breast in female, estrogen receptor positive ([place]) 08/05/2020   Allergic rhinitis 07/29/2015   Angioedema 07/29/2015   Acute urticaria 08/31/2014   Allergic reaction 08/30/2014   Urticaria, acute 08/30/2014   Urticaria 08/30/2014   GERD (gastroesophageal reflux disease) 10/21/2012   Other pancytopenia ([place]) 10/21/2012   Hypotension, unspecified 10/20/2012   Dehydration 10/20/2012   Tick-borne disease 10/20/2012   Leukopenia 10/19/2012   Thrombocytopenia ([place]) 10/19/2012   Elevated transaminase level 10/19/2012   Tachycardia 10/19/2012   Hypokalemia 10/19/2012    Claris Pong 10/02/2020, 1:03 PM  [place], [place], [address] Phone: [phone]   Fax:  [phone]  Name: Holly Allison MRN: [id number] Date of Birth: Oct 26, 1965 Cheral Almas, PT 10/02/20 1:10 PM

## 2020-10-03 ENCOUNTER — Encounter: Payer: Self-pay | Admitting: Adult Health

## 2020-10-03 ENCOUNTER — Inpatient Hospital Stay: Payer: BC Managed Care – PPO | Attending: Adult Health | Admitting: Adult Health

## 2020-10-03 VITALS — BP 139/81 | HR 81 | Temp 98.5°F | Resp 18 | Ht 64.0 in | Wt 194.4 lb

## 2020-10-03 DIAGNOSIS — I10 Essential (primary) hypertension: Secondary | ICD-10-CM | POA: Insufficient documentation

## 2020-10-03 DIAGNOSIS — I9789 Other postprocedural complications and disorders of the circulatory system, not elsewhere classified: Secondary | ICD-10-CM | POA: Diagnosis not present

## 2020-10-03 DIAGNOSIS — I890 Lymphedema, not elsewhere classified: Secondary | ICD-10-CM | POA: Diagnosis not present

## 2020-10-03 DIAGNOSIS — Z170 Estrogen receptor positive status [ER+]: Secondary | ICD-10-CM | POA: Insufficient documentation

## 2020-10-03 DIAGNOSIS — K219 Gastro-esophageal reflux disease without esophagitis: Secondary | ICD-10-CM | POA: Insufficient documentation

## 2020-10-03 DIAGNOSIS — R011 Cardiac murmur, unspecified: Secondary | ICD-10-CM | POA: Insufficient documentation

## 2020-10-03 DIAGNOSIS — C50412 Malignant neoplasm of upper-outer quadrant of left female breast: Secondary | ICD-10-CM | POA: Insufficient documentation

## 2020-10-03 NOTE — Assessment & Plan Note (Signed)
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 grade 1, strong ER 90%/PR 95% positive, HER-2 negative, Ki67 <5%  Pathology counseling: I discussed the final pathology report of the patient provided  a copy of this report. I discussed the margins as well as lymph node surgeries. We also discussed the final staging along with previously performed ER/PR and HER-2/neu testing.  Treatment plan: 1. Adjuvant radiation followed by 2. Adjuvant antiestrogen therapy  ----------------------------------------------------------------------------------------------------------------------------------------------------  Her left breast pain and breast exam is consistent with lymphedema.  She notes that she feels blood flow is constricted in the mornings to her nipple.  This is likely due to the increased fluid in her breast.  She is improving with the recommendations by physical therapy.  As she continues on with radiation wearing the compression bra and insert may be tricky.  I suggested she talk to her radiation oncologist for suggestions to make this as comfortable as easy.  If the nipple changes continue or if the breast worsens, we will call and get her in with Dr. [name].  She knows to call for any questions that may arise between now and her next appointment.  We are happy to see her sooner if needed.

## 2020-10-03 NOTE — Progress Notes (Signed)
[facility] Follow up:    Manon Hilding, MD [place] [address]   DIAGNOSIS: Cancer Staging Malignant neoplasm of upper-outer quadrant of left breast in female, estrogen receptor positive ([place]) Staging form: Breast, AJCC 8th Edition - Clinical stage from 08/05/2020: Stage IA (cT1a, cN0, cM0, G1, ER+, PR+, HER2-) - Signed by Nicholas Lose, MD on 08/05/2020 Stage prefix: Initial diagnosis Histologic grading system: 3 grade system   SUMMARY OF ONCOLOGIC HISTORY: Oncology History  Malignant neoplasm of upper-outer quadrant of left breast in female, estrogen receptor positive ([place])  07/16/2020 Initial Diagnosis   Screening mammogram detected possible asymmetries in the bilateral breasts. 0.5cm mass at the 2:30 position in the left breast, with no left axillary adenopathy, and no focal abnormalities in the right breast. Biopsy showed invasive and in situ ductal carcinoma, grade 1, strong ER/PR positive, HER-2 negative, Ki67 <5%   08/05/2020 Cancer Staging   Staging form: Breast, AJCC 8th Edition - Clinical stage from 08/05/2020: Stage IA (cT1a, cN0, cM0, G1, ER+, PR+, HER2-) - Signed by Nicholas Lose, MD on 08/05/2020  Stage prefix: Initial diagnosis  Histologic grading system: 3 grade system    08/07/2020 Genetic Testing   Negative genetic testing on the Common hereditary cancer panel.  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  The Common Hereditary Gene Panel offered by [facility] includes sequencing and/or deletion duplication testing of the following 47 genes: APC, ATM, AXIN2, BARD1, BMPR1A, BRCA1, BRCA2, BRIP1, CDH1, CDK4, CDKN2A (p14ARF), CDKN2A (p16INK4a), CHEK2, CTNNA1, DICER1, EPCAM (Deletion/duplication testing only), GREM1 (promoter region deletion/duplication testing only), KIT, MEN1, MLH1, MSH2, MSH3, MSH6, MUTYH, NBN, NF1, NHTL1, PALB2, PDGFRA, PMS2, POLD1, POLE, PTEN, RAD50, RAD51C, RAD51D, SDHB, SDHC, SDHD, SMAD4, SMARCA4. STK11, TP53, TSC1, TSC2,  and VHL.  The following genes were evaluated for sequence changes only: SDHA and HOXB13 c.251G>A variant only. The report date is August 07, 2020.   08/14/2020 Surgery   Left lumpectomy [name]): invasive and in situ lobular carcinoma, 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      CURRENT THERAPY: adjuvant radiation  INTERVAL HISTORY: ELLIS KOFFLER 55 y.o. female returns for urgent evaluation of left breast pain and swelling.  She notes this has been going on since her breast surgery that happened on 5/5 and is getting worse each week.  She notes her swelling is worse too.  She called our office last week and was told to come in today, she says she didn't receive a return call from Dr. Cristal Generous nurse.  She saw PT yesterday who worked with her and gave her recommendations for a bra insert and compression bra which have helped her today and her pain has been much improved.    She has not had fevers or chills.  She has not had warmth or drainage from the breast.  She began adjuvant radiation therapy this week and has received four treatments.     Patient Active Problem List   Diagnosis Date Noted   Genetic testing 08/08/2020   Malignant neoplasm of upper-outer quadrant of left breast in female, estrogen receptor positive ([place]) 08/05/2020   Allergic rhinitis 07/29/2015   Angioedema 07/29/2015   Acute urticaria 08/31/2014   Allergic reaction 08/30/2014   Urticaria, acute 08/30/2014   Urticaria 08/30/2014   GERD (gastroesophageal reflux disease) 10/21/2012   Other pancytopenia ([place]) 10/21/2012   Hypotension, unspecified 10/20/2012   Dehydration 10/20/2012   Tick-borne disease 10/20/2012   Leukopenia 10/19/2012   Thrombocytopenia (  [place]) 10/19/2012   Elevated transaminase level 10/19/2012   Tachycardia 10/19/2012   Hypokalemia 10/19/2012    has No Known Allergies.  MEDICAL HISTORY: Past Medical History:  Diagnosis Date   Angio-edema    Cancer  [facility])    Family history of adverse reaction to anesthesia    Mother    Heart murmur    Since childhood   Pre-diabetes    Patient states she was told she is prediabetic at yearly physical two months ago. Patient states her A1 C was around 5.6   Urticaria     SURGICAL HISTORY: Past Surgical History:  Procedure Laterality Date   AXILLARY SENTINEL NODE BIOPSY Left 08/14/2020   Procedure: LEFT AXILLARY SENTINEL NODE BIOPSY;  Surgeon: Rolm Bookbinder, MD;  Location: [place];  Service: General;  Laterality: Left;   BREAST LUMPECTOMY WITH RADIOACTIVE SEED AND SENTINEL LYMPH NODE BIOPSY Left 08/14/2020   Procedure: LEFT BREAST LUMPECTOMY WITH RADIOACTIVE SEED;  Surgeon: Rolm Bookbinder, MD;  Location: [place];  Service: General;  Laterality: Left;   CESAREAN SECTION     TONSILLECTOMY      SOCIAL HISTORY: Social History   Socioeconomic History   Marital status: Married    Spouse name: Not on file   Number of children: Not on file   Years of education: Not on file   Highest education level: Not on file  Occupational History   Not on file  Tobacco Use   Smoking status: Never   Smokeless tobacco: Never  Vaping Use   Vaping Use: Never used  Substance and Sexual Activity   Alcohol use: No   Drug use: No   Sexual activity: Not on file  Other Topics Concern   Not on file  Social History Narrative   Not on file   Social Determinants of Health   Financial Resource Strain: Not on file  Food Insecurity: Not on file  Transportation Needs: Not on file  Physical Activity: Not on file  Stress: Not on file  Social Connections: Not on file  Intimate Partner Violence: Not on file    FAMILY HISTORY: Family History  Problem Relation Age of Onset   Urticaria Neg Hx    Allergic rhinitis Neg Hx    Angioedema Neg Hx    Asthma Neg Hx    Atopy Neg Hx    Eczema Neg Hx    Immunodeficiency Neg Hx     Review of Systems  Constitutional:  Negative for appetite change, chills, fatigue, fever  and unexpected weight change.  HENT:   Negative for hearing loss, lump/mass and trouble swallowing.   Eyes:  Negative for eye problems and icterus.  Respiratory:  Negative for chest tightness, cough and shortness of breath.   Cardiovascular:  Negative for chest pain, leg swelling and palpitations.  Gastrointestinal:  Negative for abdominal distention, abdominal pain, constipation, diarrhea, nausea and vomiting.  Endocrine: Negative for hot flashes.  Genitourinary:  Negative for difficulty urinating.   Musculoskeletal:  Negative for arthralgias.  Skin:  Negative for itching and rash.  Neurological:  Negative for dizziness, extremity weakness, headaches and numbness.  Hematological:  Negative for adenopathy. Does not bruise/bleed easily.  Psychiatric/Behavioral:  Negative for depression. The patient is not nervous/anxious.      PHYSICAL EXAMINATION  ECOG PERFORMANCE STATUS: 1 - Symptomatic but completely ambulatory  Vitals:   10/03/20 1534  BP: 139/81  Pulse: 81  Resp: 18  Temp: 98.5 F (36.9 C)  SpO2: 98%    Physical Exam  Constitutional:      General: She is not in acute distress.    Appearance: Normal appearance. She is not toxic-appearing.  HENT:     Head: Normocephalic and atraumatic.  Eyes:     General: No scleral icterus. Cardiovascular:     Rate and Rhythm: Normal rate and regular rhythm.     Pulses: Normal pulses.     Heart sounds: Normal heart sounds.  Pulmonary:     Effort: Pulmonary effort is normal.     Breath sounds: Normal breath sounds.     Comments: Right breast benign, left breast s/p lumpectomy with swelling and mild erythema, no warmth noted, no tenderness Abdominal:     General: Abdomen is flat. Bowel sounds are normal. There is no distension.     Palpations: Abdomen is soft.     Tenderness: There is no abdominal tenderness.  Musculoskeletal:        General: No swelling.     Cervical back: Neck supple.  Lymphadenopathy:     Cervical: No cervical  adenopathy.  Skin:    General: Skin is warm and dry.     Findings: No rash.  Neurological:     General: No focal deficit present.     Mental Status: She is alert.  Psychiatric:        Mood and Affect: Mood normal.        Behavior: Behavior normal.    LABORATORY DATA:  CBC    Component Value Date/Time   WBC 7.7 08/06/2020 1515   RBC 4.11 08/06/2020 1515   HGB 12.8 08/06/2020 1515   HCT 37.7 08/06/2020 1515   PLT 301 08/06/2020 1515   MCV 91.7 08/06/2020 1515   MCH 31.1 08/06/2020 1515   MCHC 34.0 08/06/2020 1515   RDW 12.4 08/06/2020 1515   LYMPHSABS 2,379 07/29/2015 1348   MONOABS 366 07/29/2015 1348   EOSABS 122 07/29/2015 1348   BASOSABS 0 07/29/2015 1348    CMP     Component Value Date/Time   NA 138 08/06/2020 1515   K 3.8 08/06/2020 1515   CL 102 08/06/2020 1515   CO2 28 08/06/2020 1515   GLUCOSE 95 08/06/2020 1515   BUN 10 08/06/2020 1515   CREATININE 0.79 08/06/2020 1515   CREATININE 0.83 07/29/2015 1348   CALCIUM 9.3 08/06/2020 1515   PROT 6.9 07/29/2015 1348   ALBUMIN 4.4 07/29/2015 1348   AST 15 07/29/2015 1348   ALT 14 07/29/2015 1348   ALKPHOS 47 07/29/2015 1348   BILITOT 0.5 07/29/2015 1348   GFRNONAA >60 08/06/2020 1515   GFRAA >60 09/01/2014 0307        ASSESSMENT and THERAPY PLAN:   Malignant neoplasm of upper-outer quadrant of left breast in female, estrogen receptor positive ([place]) 08/14/2020:Left lumpectomy [name]): invasive and in situ lobular carcinoma, 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 grade 1, strong ER 90%/PR 95% positive, HER-2 negative, Ki67 <5%  Pathology counseling: I discussed the final pathology report of the patient provided  a copy of this report. I discussed the margins as well as lymph node surgeries. We also discussed the final staging along with previously performed ER/PR and HER-2/neu testing.  Treatment plan: 1. Adjuvant radiation followed by 2. Adjuvant antiestrogen  therapy  ----------------------------------------------------------------------------------------------------------------------------------------------------  Her left breast pain and breast exam is consistent with lymphedema.  She notes that she feels blood flow is constricted in the mornings to her nipple.  This is likely due to the increased fluid in  her breast.  She is improving with the recommendations by physical therapy.  As she continues on with radiation wearing the compression bra and insert may be tricky.  I suggested she talk to her radiation oncologist for suggestions to make this as comfortable as easy.  If the nipple changes continue or if the breast worsens, we will call and get her in with Dr. [name].  She knows to call for any questions that may arise between now and her next appointment.  We are happy to see her sooner if needed.  Total encounter time: 20 minutes in chart review, face to face visit time, coordination and documentation of the encounter*  Wilber Bihari, NP 10/03/20 4:08 PM Medical Oncology and Hematology [facility] [place], [place] [address] Tel. [phone]    Fax. [phone]  .[email or web address]

## 2020-10-07 ENCOUNTER — Encounter (HOSPITAL_COMMUNITY): Payer: BC Managed Care – PPO

## 2020-10-09 ENCOUNTER — Encounter (HOSPITAL_COMMUNITY): Payer: BC Managed Care – PPO

## 2020-10-14 ENCOUNTER — Encounter (HOSPITAL_COMMUNITY): Payer: BC Managed Care – PPO | Admitting: Physical Therapy

## 2020-10-16 ENCOUNTER — Encounter (HOSPITAL_COMMUNITY): Payer: BC Managed Care – PPO | Admitting: Physical Therapy

## 2020-10-20 ENCOUNTER — Other Ambulatory Visit: Payer: Self-pay

## 2020-10-20 ENCOUNTER — Ambulatory Visit (HOSPITAL_COMMUNITY): Payer: BC Managed Care – PPO | Attending: General Surgery | Admitting: Physical Therapy

## 2020-10-20 DIAGNOSIS — Z170 Estrogen receptor positive status [ER+]: Secondary | ICD-10-CM | POA: Insufficient documentation

## 2020-10-20 DIAGNOSIS — R293 Abnormal posture: Secondary | ICD-10-CM | POA: Diagnosis present

## 2020-10-20 DIAGNOSIS — C50911 Malignant neoplasm of unspecified site of right female breast: Secondary | ICD-10-CM | POA: Diagnosis present

## 2020-10-20 DIAGNOSIS — N6489 Other specified disorders of breast: Secondary | ICD-10-CM | POA: Diagnosis present

## 2020-10-20 NOTE — Therapy (Signed)
[place], [place], [address] Phone: [phone]   Fax:  [phone]  Physical Therapy Treatment  Patient Details  Name: Holly Allison MRN: [id number] Date of Birth: [birth year] Referring Provider (PT): Dr. [name]   Encounter Date: 10/20/2020   PT End of Session - 10/20/20 1638     Visit Number 3    Number of Visits 10    Date for PT Re-Evaluation 10/30/20    PT Start Time 3734    PT Stop Time 1630    PT Time Calculation (min) 14 min    Activity Tolerance Patient tolerated treatment well    Behavior During Therapy [facility] for tasks assessed/performed             Past Medical History:  Diagnosis Date   Angio-edema    Cancer ([place])    Family history of adverse reaction to anesthesia    Mother    Heart murmur    Since childhood   Pre-diabetes    Patient states she was told she is prediabetic at yearly physical two months ago. Patient states her A1 C was around 5.6   Urticaria     Past Surgical History:  Procedure Laterality Date   AXILLARY SENTINEL NODE BIOPSY Left 08/14/2020   Procedure: LEFT AXILLARY SENTINEL NODE BIOPSY;  Surgeon: Rolm Bookbinder, MD;  Location: [place];  Service: General;  Laterality: Left;   BREAST LUMPECTOMY WITH RADIOACTIVE SEED AND SENTINEL LYMPH NODE BIOPSY Left 08/14/2020   Procedure: LEFT BREAST LUMPECTOMY WITH RADIOACTIVE SEED;  Surgeon: Rolm Bookbinder, MD;  Location: [place];  Service: General;  Laterality: Left;   CESAREAN SECTION     TONSILLECTOMY      There were no vitals filed for this visit.       [facility] PT Assessment - 10/20/20 0001       Assessment   Medical Diagnosis left breast cancer    Referring Provider (PT) Dr. [name]    Onset Date/Surgical Date 08/05/20      AROM   AROM Assessment Site Shoulder    Right/Left Shoulder Left    Left Shoulder Extension 64 Degrees    Left Shoulder Flexion 170 Degrees    Left Shoulder ABduction 170 Degrees     Left Shoulder Internal Rotation 90 Degrees      Palpation   Palpation comment tenderness at lateral breast               LYMPHEDEMA/ONCOLOGY QUESTIONNAIRE - 10/20/20 1635       Surgeries   Lumpectomy Date 08/14/20    Sentinel Lymph Node Biopsy Date 08/14/20    Number Lymph Nodes Removed 4      Treatment   Active Chemotherapy Treatment No    Past Chemotherapy Treatment No    Active Radiation Treatment Yes    Date 09/30/20    Body Site breast/axilla    Past Radiation Treatment No    Current Hormone Treatment --   will start   Past Hormone Therapy No      What other symptoms do you have   Are you Having Heaviness or Tightness No    Are you having Pain Yes   intermittent     Right Upper Extremity Lymphedema   15 cm Proximal to Olecranon Process 36 cm    Olecranon Process 29.9 cm    10 cm Proximal to Ulnar Styloid Process 24.4 cm    Just Proximal to Ulnar Styloid Process 17.7 cm  At [facility] of 2nd Digit 6.8 cm      Left Upper Extremity Lymphedema   15 cm Proximal to Olecranon Process 36.3 cm    Olecranon Process 29.6 cm    10 cm Proximal to Ulnar Styloid Process 23.8 cm    Just Proximal to Ulnar Styloid Process 17.6 cm    At [place] of 2nd Digit 6.9 cm                                     PT Long Term Goals - 10/20/20 1638       PT LONG TERM GOAL #1   Title Pt will demonstrate return to baseline AROM    Time 6    Period Weeks    Status Achieved      PT LONG TERM GOAL #3   Title Pt will attend the virtual ABC class    Time 4    Period Weeks    Status Unable to assess      PT LONG TERM GOAL #4   Title Pt will be independent in self breast MLD    Time 4    Period Weeks    Status Achieved      PT LONG TERM GOAL #5   Title Pt will have decreased left breast heaviness/swelling by 50%    Time 4    Period Weeks    Status Achieved                   Plan - 10/20/20 1640     Clinical Impression Statement Pt returns today  much improved with only slight induration and sensitivity in Lt lateral chest.  Lt UE ROM WNL and completing self manual decongestive techniques.  Pt is wearing bra purchased at [employer] and completing her radiation treatments.  No edema in Lt UE and minimal in Lt breast/chest wall.  Only issue she is having in occasional  nerve pain/stinging in Lt axilla when hands are over head and taking deep breaths (during her treatemnt).  Instructed in ways to stretch this/nerve stetches.  Pt able to demonstrate knowledge of technqiue and general self care measures.  Pt without any further skilled therapy need at this time.    Personal Factors and Comorbidities Comorbidity 2    Comorbidities Left breast CAncer with SLNB, Radiation, breast swelling    Stability/Clinical Decision Making Stable/Uncomplicated    Rehab Potential Excellent    PT Frequency 2x / week    PT Duration 4 weeks    PT Treatment/Interventions ADLs/Self Care Home Management;Therapeutic exercise;Manual lymph drainage;Scar mobilization;Passive range of motion;Patient/family education;Manual techniques    PT Next Visit Plan discharge to self care.    PT Home Exercise Plan foam in compression bra, Self MLD    Consulted and Agree with Plan of Care Patient;Family member/caregiver             Patient will benefit from skilled therapeutic intervention in order to improve the following deficits and impairments:  Postural dysfunction, Decreased knowledge of precautions, Increased edema, Pain  Visit Diagnosis: Abnormal posture  Malignant neoplasm of right breast in female, estrogen receptor positive, unspecified site of breast ([place])  Edema of breast     Problem List Patient Active Problem List   Diagnosis Date Noted   Genetic testing 08/08/2020   Malignant neoplasm of upper-outer quadrant of left breast in female, estrogen receptor positive ([place]) 08/05/2020  Allergic rhinitis 07/29/2015   Angioedema 07/29/2015   Acute  urticaria 08/31/2014   Allergic reaction 08/30/2014   Urticaria, acute 08/30/2014   Urticaria 08/30/2014   GERD (gastroesophageal reflux disease) 10/21/2012   Other pancytopenia ([place]) 10/21/2012   Hypotension, unspecified 10/20/2012   Dehydration 10/20/2012   Tick-borne disease 10/20/2012   Leukopenia 10/19/2012   Thrombocytopenia ([place]) 10/19/2012   Elevated transaminase level 10/19/2012   Tachycardia 10/19/2012   Hypokalemia 10/19/2012   Teena Irani, PTA/CLT [phone]  Teena Irani 10/20/2020, 4:44 PM  [facility] [address] [place], [place], [address] Phone: [phone]   Fax:  [phone]  Name: MERVE HOTARD MRN: [id number] Date of Birth: 09-06-1965

## 2020-10-21 ENCOUNTER — Encounter (HOSPITAL_COMMUNITY): Payer: Self-pay | Admitting: Physical Therapy

## 2020-10-21 NOTE — Therapy (Signed)
[place], [place], [address] Phone: [phone]   Fax:  [phone]  Patient Details  Name: Holly Allison MRN: [id number] Date of Birth: 05-16-1965 Referring Provider:  No ref. provider found  Encounter Date: 10/21/2020   PHYSICAL THERAPY DISCHARGE SUMMARY  Visits from Start of Care: 3  Current functional level related to goals / functional outcomes: PT has full ROM , I in self massage.    Remaining deficits: none   Education / Equipment: HEP, manual    Patient agrees to discharge. Patient goals were met. Patient is being discharged due to being pleased with the current functional level. [place], [place] CLT [phone]  10/21/2020, 3:06 PM  [facility] [address] [place], [place], [address] Phone: [phone]   Fax:  [phone]

## 2020-10-22 ENCOUNTER — Encounter (HOSPITAL_COMMUNITY): Payer: BC Managed Care – PPO | Admitting: Physical Therapy

## 2020-10-28 ENCOUNTER — Encounter: Payer: Self-pay | Admitting: *Deleted

## 2020-10-28 ENCOUNTER — Encounter (HOSPITAL_COMMUNITY): Payer: BC Managed Care – PPO | Admitting: Physical Therapy

## 2020-10-28 DIAGNOSIS — C50412 Malignant neoplasm of upper-outer quadrant of left female breast: Secondary | ICD-10-CM

## 2020-10-30 ENCOUNTER — Encounter (HOSPITAL_COMMUNITY): Payer: BC Managed Care – PPO | Admitting: Physical Therapy

## 2020-10-30 NOTE — Progress Notes (Signed)
Patient Care Team: Sasser, Silvestre Moment, MD as PCP - General (Cardiology) Mauro Kaufmann, RN as Oncology Nurse Navigator Rockwell Germany, RN as Oncology Nurse Navigator  DIAGNOSIS:    ICD-10-CM   1. Malignant neoplasm of upper-outer quadrant of left breast in female, estrogen receptor positive ([place])  C50.412    Z17.0       SUMMARY OF ONCOLOGIC HISTORY: Oncology History  Malignant neoplasm of upper-outer quadrant of left breast in female, estrogen receptor positive ([place])  07/16/2020 Initial Diagnosis   Screening mammogram detected possible asymmetries in the bilateral breasts. 0.5cm mass at the 2:30 position in the left breast, with no left axillary adenopathy, and no focal abnormalities in the right breast. Biopsy showed invasive and in situ ductal carcinoma, grade 1, strong ER/PR positive, HER-2 negative, Ki67 <5%   08/05/2020 Cancer Staging   Staging form: Breast, AJCC 8th Edition - Clinical stage from 08/05/2020: Stage IA (cT1a, cN0, cM0, G1, ER+, PR+, HER2-) - Signed by Nicholas Lose, MD on 08/05/2020  Stage prefix: Initial diagnosis  Histologic grading system: 3 grade system    08/07/2020 Genetic Testing   Negative genetic testing on the Common hereditary cancer panel.  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  The Common Hereditary Gene Panel offered by [facility] includes sequencing and/or deletion duplication testing of the following 47 genes: APC, ATM, AXIN2, BARD1, BMPR1A, BRCA1, BRCA2, BRIP1, CDH1, CDK4, CDKN2A (p14ARF), CDKN2A (p16INK4a), CHEK2, CTNNA1, DICER1, EPCAM (Deletion/duplication testing only), GREM1 (promoter region deletion/duplication testing only), KIT, MEN1, MLH1, MSH2, MSH3, MSH6, MUTYH, NBN, NF1, NHTL1, PALB2, PDGFRA, PMS2, POLD1, POLE, PTEN, RAD50, RAD51C, RAD51D, SDHB, SDHC, SDHD, SMAD4, SMARCA4. STK11, TP53, TSC1, TSC2, and VHL.  The following genes were evaluated for sequence changes only: SDHA and HOXB13 c.251G>A variant only. The report date is August 07, 2020.   08/14/2020 Surgery   Left lumpectomy [name]): invasive and in situ lobular carcinoma, 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      CHIEF COMPLIANT: Follow-up of left breast cancer   INTERVAL HISTORY: JAIELLE DLOUHY is a 55 y.o. with above-mentioned history of left breast cancer having undergone a lumpectomy. She reports to the clinic today for follow-up.  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  Her major complaint is severe hot flashes since she started Mirena and taking the estrogen cream.  ALLERGIES:  has No Known Allergies.  MEDICATIONS:  Current Outpatient Medications  Medication Sig Dispense Refill   cetirizine (ZYRTEC) 10 MG tablet Take 10 mg by mouth daily.     famotidine (PEPCID) 40 MG tablet Take 40 mg by mouth 2 (two) times daily.     oxyCODONE (OXY IR/ROXICODONE) 5 MG immediate release tablet Take 1 tablet (5 mg total) by mouth every 6 (six) hours as needed for moderate pain, severe pain or breakthrough pain. 10 tablet 0   No current facility-administered medications for this visit.    PHYSICAL EXAMINATION: ECOG PERFORMANCE STATUS: 1 - Symptomatic but completely ambulatory  Vitals:   10/31/20 1511  BP: 122/72  Pulse: 75  Resp: 18  Temp: 97.6 F (36.4 C)  SpO2: 99%   Filed Weights   10/31/20 1511  Weight: 190 lb 8 oz (86.4 kg)    LABORATORY DATA:  I have reviewed the data as listed CMP Latest Ref Rng & Units 08/06/2020 07/29/2015 09/01/2014  Glucose 70 - 99 mg/dL 95 128(H) 154(H)  BUN 6 - 20 mg/dL 10 10 18  Creatinine 0.44 - 1.00 mg/dL 0.79 0.83 0.73  Sodium 135 - 145 mmol/L 138 143 141  Potassium 3.5 - 5.1 mmol/L 3.8 3.9 3.8  Chloride 98 - 111 mmol/L 102 104 104  CO2 22 - 32 mmol/L _0 Calcium 8.9 - 10.3 mg/dL 9.3 9.4 9.2  Total Protein 6.1 - 8.1 g/dL - 6.9 -  Total Bilirubin 0.2 - 1.2 mg/dL - 0.5 -  Alkaline Phos 33 - 115 U/L - 47 -  AST 10 - 35 U/L - 15 -   ALT 6 - 29 U/L - 14 -    Lab Results  Component Value Date   WBC 7.7 08/06/2020   HGB 12.8 08/06/2020   HCT 37.7 08/06/2020   MCV 91.7 08/06/2020   PLT 301 08/06/2020   NEUTROABS 3,233 07/29/2015    ASSESSMENT & PLAN:  Malignant neoplasm of upper-outer quadrant of left breast in female, estrogen receptor positive ([place]) 08/14/2020:Left lumpectomy [name]): invasive and in situ lobular carcinoma, 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 grade 1, strong ER 90%/PR 95% positive, HER-2 negative, Ki67 <5%    Treatment plan: 1. Adjuvant radiation at [facility] completed 10/29/2020 2. Adjuvant antiestrogen therapy: We will check if she is in menopause with [place] and estradiol today.  If she is menopausal and we will send for letrozole 2.5 mg daily.  Left breast pain/lymphedema: Getting physical therapy Severe hot flashes: I sent a prescription for Effexor.  Return to clinic in 2 weeks to discuss results of [place] and estradiol and determine the appropriate antiestrogen treatment plan.    No orders of the defined types were placed in this encounter.  The patient has a good understanding of the overall plan. she agrees with it. she will call with any problems that may develop before the next visit here.  Total time spent: 20 mins including face to face time and time spent for planning, charting and coordination of care  Rulon Eisenmenger, MD, MPH 10/31/2020  I, Thana Ates, am acting as scribe for Dr. Nicholas Lose.  I have reviewed the above documentation for accuracy and completeness, and I agree with the above.

## 2020-10-31 ENCOUNTER — Inpatient Hospital Stay: Payer: BC Managed Care – PPO | Attending: Hematology and Oncology | Admitting: Hematology and Oncology

## 2020-10-31 ENCOUNTER — Ambulatory Visit: Payer: BC Managed Care – PPO | Admitting: Hematology and Oncology

## 2020-10-31 ENCOUNTER — Other Ambulatory Visit: Payer: Self-pay

## 2020-10-31 ENCOUNTER — Inpatient Hospital Stay: Payer: BC Managed Care – PPO

## 2020-10-31 DIAGNOSIS — R232 Flushing: Secondary | ICD-10-CM | POA: Insufficient documentation

## 2020-10-31 DIAGNOSIS — C50412 Malignant neoplasm of upper-outer quadrant of left female breast: Secondary | ICD-10-CM | POA: Diagnosis present

## 2020-10-31 DIAGNOSIS — Z923 Personal history of irradiation: Secondary | ICD-10-CM | POA: Insufficient documentation

## 2020-10-31 DIAGNOSIS — Z170 Estrogen receptor positive status [ER+]: Secondary | ICD-10-CM | POA: Insufficient documentation

## 2020-10-31 DIAGNOSIS — Z79899 Other long term (current) drug therapy: Secondary | ICD-10-CM | POA: Insufficient documentation

## 2020-10-31 MED ORDER — VENLAFAXINE HCL ER 37.5 MG PO CP24: 37.5000 mg | ORAL_CAPSULE | Freq: Every day | ORAL | 6 refills | Status: DC

## 2020-10-31 NOTE — Assessment & Plan Note (Signed)
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 grade 1, strong ER 90%/PR 95% positive, HER-2 negative, Ki67 <5%  Treatment plan: 1. Adjuvant radiation at [facility] 2. Adjuvant antiestrogen therapy  Left breast pain/lymphedema:  Based on favorable characteristics, tumor being grade 1 and less than a centimeter I did not recommend Oncotype DX testing.  Return to clinic in 1 year for follow-up

## 2020-11-01 LAB — FOLLICLE STIMULATING HORMONE: FSH: 92.3 m[IU]/mL

## 2020-11-04 ENCOUNTER — Encounter (HOSPITAL_COMMUNITY): Payer: BC Managed Care – PPO | Admitting: Physical Therapy

## 2020-11-06 ENCOUNTER — Encounter (HOSPITAL_COMMUNITY): Payer: BC Managed Care – PPO | Admitting: Physical Therapy

## 2020-11-11 ENCOUNTER — Encounter (HOSPITAL_COMMUNITY): Payer: BC Managed Care – PPO | Admitting: Physical Therapy

## 2020-11-11 NOTE — Progress Notes (Signed)
HEMATOLOGY-ONCOLOGY TELEPHONE VISIT PROGRESS NOTE  I connected with Holly Allison on 11/12/2020 at  2:15 PM EDT by telephone and verified that I am speaking with the correct person using two identifiers.  I discussed the limitations, risks, security and privacy concerns of performing an evaluation and management service by telephone and the availability of in person appointments.  I also discussed with the patient that there may be a patient responsible charge related to this service. The patient expressed understanding and agreed to proceed.   History of Present Illness: Holly Allison is a 55 y.o. female with above-mentioned history of left breast cancer having undergone a lumpectomy. She reports to via telephone today for follow-up.  She had blood work performed to look at her menopausal status and she was clearly found to be in menopause.  We are calling her to discuss the results of this test and to decide on the appropriate antiestrogen treatment plan.  Oncology History  Malignant neoplasm of upper-outer quadrant of left breast in female, estrogen receptor positive ([place])  07/16/2020 Initial Diagnosis   Screening mammogram detected possible asymmetries in the bilateral breasts. 0.5cm mass at the 2:30 position in the left breast, with no left axillary adenopathy, and no focal abnormalities in the right breast. Biopsy showed invasive and in situ ductal carcinoma, grade 1, strong ER/PR positive, HER-2 negative, Ki67 <5%   08/05/2020 Cancer Staging   Staging form: Breast, AJCC 8th Edition - Clinical stage from 08/05/2020: Stage IA (cT1a, cN0, cM0, G1, ER+, PR+, HER2-) - Signed by Nicholas Lose, MD on 08/05/2020  Stage prefix: Initial diagnosis  Histologic grading system: 3 grade system    08/07/2020 Genetic Testing   Negative genetic testing on the Common hereditary cancer panel.  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  The Common Hereditary Gene Panel offered by [facility] includes  sequencing and/or deletion duplication testing of the following 47 genes: APC, ATM, AXIN2, BARD1, BMPR1A, BRCA1, BRCA2, BRIP1, CDH1, CDK4, CDKN2A (p14ARF), CDKN2A (p16INK4a), CHEK2, CTNNA1, DICER1, EPCAM (Deletion/duplication testing only), GREM1 (promoter region deletion/duplication testing only), KIT, MEN1, MLH1, MSH2, MSH3, MSH6, MUTYH, NBN, NF1, NHTL1, PALB2, PDGFRA, PMS2, POLD1, POLE, PTEN, RAD50, RAD51C, RAD51D, SDHB, SDHC, SDHD, SMAD4, SMARCA4. STK11, TP53, TSC1, TSC2, and VHL.  The following genes were evaluated for sequence changes only: SDHA and HOXB13 c.251G>A variant only. The report date is August 07, 2020.   08/14/2020 Surgery   Left lumpectomy [name]): invasive and in situ lobular carcinoma, 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    10/01/2020 - 10/29/2020 Radiation Therapy   Adj XRT at [facility]     Observations/Objective:     Assessment Plan:  Malignant neoplasm of upper-outer quadrant of left breast in female, estrogen receptor positive ([place]) 08/14/2020:Left lumpectomy [name]): invasive and in situ lobular carcinoma, 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 grade 1, strong ER 90%/PR 95% positive, HER-2 negative, Ki67 <5%    Treatment plan: 1. Adjuvant radiation at [facility] completed 10/29/2020 2. Adjuvant antiestrogen therapy: We will check if she is in menopause with [place] and estradiol today.  If she is menopausal and we will send for letrozole 2.5 mg daily.   Left breast pain/lymphedema: Getting physical therapy Severe hot flashes: I sent a prescription for Effexor. [place] 92  antiestrogen treatment plan.: Letrozole 2.5 mg daily  RTC in 3 months for SCP visit      I discussed the assessment and treatment plan with the patient. The patient was provided  an opportunity to ask questions and all were answered. The patient agreed with the plan and demonstrated an understanding of the instructions. The patient was  advised to call back or seek an in-person evaluation if the symptoms worsen or if the condition fails to improve as anticipated.   Total time spent: 12 mins including non-face to face time and time spent for planning, charting and coordination of care  Rulon Eisenmenger, MD 11/12/2020    I, Thana Ates, am acting as scribe for Nicholas Lose, MD.  I have reviewed the above documentation for accuracy and completeness, and I agree with the above.

## 2020-11-11 NOTE — Assessment & Plan Note (Signed)
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 grade 1, strong ER90%/PR95%positive, HER-2 negative, Ki67 <5%  Treatment plan: 1.Adjuvant radiationat [place] completed 10/29/2020 2. Adjuvant antiestrogen therapy: We will check if she is in menopause with [place] and estradiol today.  If she is menopausal and we will send for letrozole 2.5 mg daily.  Left breast pain/lymphedema: Getting physical therapy Severe hot flashes: I sent a prescription for Effexor. [name]  antiestrogen treatment plan.: Letrozole 2.5 mg daily  RTC in 3 months for SCP visit

## 2020-11-12 ENCOUNTER — Inpatient Hospital Stay: Payer: BC Managed Care – PPO | Attending: Hematology and Oncology | Admitting: Hematology and Oncology

## 2020-11-12 DIAGNOSIS — C50412 Malignant neoplasm of upper-outer quadrant of left female breast: Secondary | ICD-10-CM | POA: Diagnosis not present

## 2020-11-12 DIAGNOSIS — Z170 Estrogen receptor positive status [ER+]: Secondary | ICD-10-CM

## 2020-11-12 LAB — ESTRADIOL, ULTRA SENS: Estradiol, Sensitive: 4 pg/mL

## 2020-11-12 MED ORDER — LETROZOLE 2.5 MG PO TABS: 2.5000 mg | ORAL_TABLET | Freq: Every day | ORAL | 3 refills | Status: AC

## 2020-11-13 ENCOUNTER — Telehealth: Payer: Self-pay | Admitting: Hematology and Oncology

## 2020-11-13 ENCOUNTER — Encounter (HOSPITAL_COMMUNITY): Payer: BC Managed Care – PPO | Admitting: Physical Therapy

## 2020-11-13 NOTE — Telephone Encounter (Signed)
Scheduled appointment per 08/03 los. Patient is aware.

## 2020-11-19 ENCOUNTER — Other Ambulatory Visit: Payer: Self-pay | Admitting: Adult Health

## 2020-11-19 DIAGNOSIS — C50412 Malignant neoplasm of upper-outer quadrant of left female breast: Secondary | ICD-10-CM

## 2020-11-19 DIAGNOSIS — Z170 Estrogen receptor positive status [ER+]: Secondary | ICD-10-CM

## 2021-02-13 ENCOUNTER — Encounter: Payer: BC Managed Care – PPO | Admitting: Adult Health

## 2021-02-17 ENCOUNTER — Encounter: Payer: BC Managed Care – PPO | Admitting: Adult Health

## 2021-02-20 ENCOUNTER — Telehealth: Payer: Self-pay | Admitting: *Deleted

## 2021-02-26 ENCOUNTER — Telehealth: Payer: Self-pay | Admitting: *Deleted

## 2021-03-02 ENCOUNTER — Other Ambulatory Visit: Payer: Self-pay

## 2021-03-02 ENCOUNTER — Inpatient Hospital Stay: Payer: BC Managed Care – PPO | Attending: Hematology and Oncology | Admitting: Adult Health

## 2021-03-02 ENCOUNTER — Encounter: Payer: Self-pay | Admitting: Adult Health

## 2021-03-02 VITALS — BP 143/81 | HR 68 | Temp 97.7°F | Resp 18 | Ht 64.0 in | Wt 181.9 lb

## 2021-03-02 DIAGNOSIS — C50412 Malignant neoplasm of upper-outer quadrant of left female breast: Secondary | ICD-10-CM | POA: Diagnosis not present

## 2021-03-02 DIAGNOSIS — E2839 Other primary ovarian failure: Secondary | ICD-10-CM | POA: Diagnosis not present

## 2021-03-02 DIAGNOSIS — I890 Lymphedema, not elsewhere classified: Secondary | ICD-10-CM

## 2021-03-02 DIAGNOSIS — Z79811 Long term (current) use of aromatase inhibitors: Secondary | ICD-10-CM | POA: Diagnosis not present

## 2021-03-02 DIAGNOSIS — Z170 Estrogen receptor positive status [ER+]: Secondary | ICD-10-CM | POA: Insufficient documentation

## 2021-03-02 NOTE — Progress Notes (Signed)
SURVIVORSHIP VISIT:  BRIEF ONCOLOGIC HISTORY:  Oncology History  Malignant neoplasm of upper-outer quadrant of left breast in female, estrogen receptor positive ([facility])  07/16/2020 Initial Diagnosis   Screening mammogram detected possible asymmetries in the bilateral breasts. 0.5cm mass at the 2:30 position in the left breast, with no left axillary adenopathy, and no focal abnormalities in the right breast. Biopsy showed invasive and in situ ductal carcinoma, grade 1, strong ER/PR positive, HER-2 negative, Ki67 <5%   08/05/2020 Cancer Staging   Staging form: Breast, AJCC 8th Edition - Clinical stage from 08/05/2020: Stage IA (cT1a, cN0, cM0, G1, ER+, PR+, HER2-) - Signed by Serena Croissant, MD on 08/05/2020 Stage prefix: Initial diagnosis Histologic grading system: 3 grade system    08/07/2020 Genetic Testing   Negative genetic testing on the Common hereditary cancer panel.  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  The Common Hereditary Gene Panel offered by [facility] includes sequencing and/or deletion duplication testing of the following 47 genes: APC, ATM, AXIN2, BARD1, BMPR1A, BRCA1, BRCA2, BRIP1, CDH1, CDK4, CDKN2A (p14ARF), CDKN2A (p16INK4a), CHEK2, CTNNA1, DICER1, EPCAM (Deletion/duplication testing only), GREM1 (promoter region deletion/duplication testing only), KIT, MEN1, MLH1, MSH2, MSH3, MSH6, MUTYH, NBN, NF1, NHTL1, PALB2, PDGFRA, PMS2, POLD1, POLE, PTEN, RAD50, RAD51C, RAD51D, SDHB, SDHC, SDHD, SMAD4, SMARCA4. STK11, TP53, TSC1, TSC2, and VHL.  The following genes were evaluated for sequence changes only: SDHA and HOXB13 c.251G>A variant only. The report date is August 07, 2020.   08/14/2020 Surgery   Left lumpectomy Dwain Sarna): invasive and in situ lobular carcinoma, 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    08/14/2020 Cancer Staging   Staging form: Breast, AJCC 8th Edition - Pathologic stage from 08/14/2020: Stage IA (pT1b, pN0, cM0, G1, ER+, PR+, HER2-)  - Signed by Loa Socks, NP on 03/02/2021 Stage prefix: Initial diagnosis Histologic grading system: 3 grade system    10/01/2020 - 10/29/2020 Radiation Therapy   Dr. Barnetta Chapel in [place]: Standard radiation therapy to the left breast. This will consist of a dose of 4272 cGy in 16 fractions followed by a boost to the tumor bed of an additional 1000 cGy in 4 fractions. Simulation and treatment treatment are scheduled to begin as soon as possible.    11/2020 -  Anti-estrogen oral therapy   Letrozole daily     INTERVAL HISTORY:  Holly Allison to review her survivorship care plan detailing her treatment course for breast cancer, as well as monitoring long-term side effects of that treatment, education regarding health maintenance, screening, and overall wellness and health promotion.     Overall, Holly Allison reports feeling quite well.  She has some breast pain that is intermittent.  She also experiences soreness under her arm as well.  She was previously receiving PT and it had improved however today she notes that it is worse.  She is hoping to return to physical therapy at the [facility] location.  REVIEW OF SYSTEMS:  Review of Systems  Constitutional:  Positive for fatigue. Negative for appetite change, chills, fever and unexpected weight change.  HENT:   Negative for hearing loss, lump/mass and trouble swallowing.   Eyes:  Negative for eye problems and icterus.  Respiratory:  Negative for chest tightness, cough and shortness of breath.   Cardiovascular:  Negative for chest pain, leg swelling and palpitations.  Gastrointestinal:  Negative for abdominal distention, abdominal pain, constipation, diarrhea, nausea and vomiting.  Endocrine: Negative for hot flashes.  Genitourinary:  Negative for difficulty urinating.   Musculoskeletal:  Negative for arthralgias.  Skin:  Negative for itching and rash.  Neurological:  Negative for dizziness, extremity weakness, headaches and numbness.   Hematological:  Negative for adenopathy. Does not bruise/bleed easily.  Psychiatric/Behavioral:  Negative for depression. The patient is not nervous/anxious.   Breast: Denies any new nodularity, masses, tenderness, nipple changes, or nipple discharge.      ONCOLOGY TREATMENT TEAM:  1. Surgeon:  Dr. [name] at [facility] 2. Medical Oncologist: Dr. Lindi Adie  3. Radiation Oncologist: Dr. Adella Nissen    PAST MEDICAL/SURGICAL HISTORY:  Past Medical History:  Diagnosis Date   Angio-edema    Cancer [facility])    Family history of adverse reaction to anesthesia    Mother    Heart murmur    Since childhood   Pre-diabetes    Patient states she was told she is prediabetic at yearly physical two months ago. Patient states her A1 C was around 5.6   Urticaria    Past Surgical History:  Procedure Laterality Date   AXILLARY SENTINEL NODE BIOPSY Left 08/14/2020   Procedure: LEFT AXILLARY SENTINEL NODE BIOPSY;  Surgeon: Rolm Bookbinder, MD;  Location: [place];  Service: General;  Laterality: Left;   BREAST LUMPECTOMY WITH RADIOACTIVE SEED AND SENTINEL LYMPH NODE BIOPSY Left 08/14/2020   Procedure: LEFT BREAST LUMPECTOMY WITH RADIOACTIVE SEED;  Surgeon: Rolm Bookbinder, MD;  Location: [place];  Service: General;  Laterality: Left;   CESAREAN SECTION     TONSILLECTOMY       ALLERGIES:  No Known Allergies   CURRENT MEDICATIONS:  Outpatient Encounter Medications as of 03/02/2021  Medication Sig   calcium carbonate (OSCAL) 1500 (600 Ca) MG TABS tablet Take 600 mg of elemental calcium by mouth 2 (two) times daily with a meal.   cetirizine (ZYRTEC) 10 MG tablet Take 10 mg by mouth daily.   cholecalciferol (VITAMIN D3) 25 MCG (1000 UNIT) tablet Take 1,000 Units by mouth 2 (two) times daily.   famotidine (PEPCID) 40 MG tablet Take 40 mg by mouth daily.   letrozole (FEMARA) 2.5 MG tablet Take 1 tablet (2.5 mg total) by mouth daily.   venlafaxine XR (EFFEXOR-XR) 37.5 MG 24 hr capsule Take 1  capsule (37.5 mg total) by mouth daily with breakfast.   No facility-administered encounter medications on file as of 03/02/2021.     ONCOLOGIC FAMILY HISTORY:  Family History  Problem Relation Age of Onset   Urticaria Neg Hx    Allergic rhinitis Neg Hx    Angioedema Neg Hx    Asthma Neg Hx    Atopy Neg Hx    Eczema Neg Hx    Immunodeficiency Neg Hx      GENETIC COUNSELING/TESTING: See above  SOCIAL HISTORY:  Social History   Socioeconomic History   Marital status: Married    Spouse name: Not on file   Number of children: Not on file   Years of education: Not on file   Highest education level: Not on file  Occupational History   Not on file  Tobacco Use   Smoking status: Never   Smokeless tobacco: Never  Vaping Use   Vaping Use: Never used  Substance and Sexual Activity   Alcohol use: No   Drug use: No   Sexual activity: Not on file  Other Topics Concern   Not on file  Social History Narrative   Not on file   Social Determinants of Health   Financial Resource Strain: Not on file  Food Insecurity: Not on file  Transportation Needs: Not on file  Physical Activity: Not on file  Stress: Not on file  Social Connections: Not on file  Intimate Partner Violence: Not on file     OBSERVATIONS/OBJECTIVE:  BP (!) 143/81 (BP Location: Right Arm, Patient Position: Sitting)   Pulse 68   Temp 97.7 F (36.5 C) (Temporal)   Resp 18   Ht $R'5\' 4"'Bu$  (1.626 m)   Wt 181 lb 14.4 oz (82.5 kg)   LMP 07/07/2014   SpO2 99%   BMI 31.22 kg/m  GENERAL: Patient is a well appearing female in no acute distress HEENT:  Sclerae anicteric.  Oropharynx clear and moist. No ulcerations or evidence of oropharyngeal candidiasis. Neck is supple.  NODES:  No cervical, supraclavicular, or axillary lymphadenopathy palpated.  BREAST EXAM:  Left breast s/p lumpectomy and radiation, no sign of local recurrence, right breast benign LUNGS:  Clear to auscultation bilaterally.  No wheezes or  rhonchi. HEART:  Regular rate and rhythm. No murmur appreciated. ABDOMEN:  Soft, nontender.  Positive, normoactive bowel sounds. No organomegaly palpated. MSK:  No focal spinal tenderness to palpation. Full range of motion bilaterally in the upper extremities. EXTREMITIES:  No peripheral edema.   SKIN:  Clear with no obvious rashes or skin changes. No nail dyscrasia. NEURO:  Nonfocal. Well oriented.  Appropriate affect.   LABORATORY DATA:  None for this visit.  DIAGNOSTIC IMAGING:  None for this visit.      ASSESSMENT AND PLAN:  Ms.. Allison is a pleasant 55 y.o. female with Stage IA left breast invasive lobular carcinoma, ER+/PR+/HER2-, diagnosed in 06/2020, treated with lumpectomy, adjuvant radiation therapy, and anti-estrogen therapy with Letrozole beginning in 11/2020.  She presents to the Survivorship Clinic for our initial meeting and routine follow-up post-completion of treatment for breast cancer.    1. Stage IA left breast cancer:  Holly Allison is continuing to recover from definitive treatment for breast cancer. She will follow-up with her medical oncologist, Dr. Lindi Adie in 6 months with history and physical exam per surveillance protocol.  She will continue her anti-estrogen therapy with Letrozole. Thus far, she is tolerating the Letrozole well, with minimal side effects.  She takes Effexor to help with hot flashes.  Sometimes this interferes with her ability to sleep.  After discussion we decided that she will take this in the morning instead of the evening.  She was instructed to make Dr. Lindi Adie or myself aware if she begins to experience any worsening side effects of the medication and I could see her back in clinic to help manage those side effects, as needed. Her mammogram is due 06/2021; orders placed today.   Today, a comprehensive survivorship care plan and treatment summary was reviewed with the patient today detailing her breast cancer diagnosis, treatment course, potential  late/long-term effects of treatment, appropriate follow-up care with recommendations for the future, and patient education resources.  A copy of this summary, along with a letter will be sent to the patient's primary care provider via mail/fax/In Basket message after today's visit.    2.  Lymphedema: I have placed a request for her to return to outpatient rehab.  3. Bone health: I placed orders for her to undergo bone density testing.  We discussed bone health including calcium, vitamin D, and weightbearing exercises.  4. Cancer screening:  Due to Holly Allison's history and her age, she should receive screening for skin cancers, colon cancer, and gynecologic cancers.  The information and recommendations are listed on the patient's comprehensive  care plan/treatment summary and were reviewed in detail with the patient.    5. Health maintenance and wellness promotion: Holly Allison was encouraged to consume 5-7 servings of fruits and vegetables per day. We reviewed the "Nutrition Rainbow" handout.  She was also encouraged to engage in moderate to vigorous exercise for 30 minutes per day most days of the week. We discussed the [employer] fitness program, which is designed for cancer survivors to help them become more physically fit after cancer treatments.  She was instructed to limit her alcohol consumption and continue to abstain from tobacco use.     6. Support services/counseling: It is not uncommon for this period of the patient's cancer care trajectory to be one of many emotions and stressors.  She was given information regarding our available services and encouraged to contact me with any questions or for help enrolling in any of our support group/programs.    Follow up instructions:    -Return to cancer center in 6 months for f/u with Dr. Lindi Adie  -Mammogram due in 06/2021 -Follow up with surgery 1 year -She is welcome to return back to the Survivorship Clinic at any time; no additional  follow-up needed at this time.  -Consider referral back to survivorship as a long-term survivor for continued surveillance  The patient was provided an opportunity to ask questions and all were answered. The patient agreed with the plan and demonstrated an understanding of the instructions.   Total encounter time: 45 minutes in face-to-face visit time, chart review, lab review, order entry, care coordination, and documentation of encounter.  Wilber Bihari, NP 03/02/21 2:37 PM Medical Oncology and Hematology [facility] [place], [place] [address] Tel. [phone]    Fax. [phone]  *Total Encounter Time as defined by the Centers for Medicare and Medicaid Services includes, in addition to the face-to-face time of a patient visit (documented in the note above) non-face-to-face time: obtaining and reviewing outside history, ordering and reviewing medications, tests or procedures, care coordination (communications with other health care professionals or caregivers) and documentation in the medical record.

## 2021-03-02 NOTE — Progress Notes (Signed)
Faxed orders for mammogram and bone density to [place] [phone]

## 2021-03-25 ENCOUNTER — Other Ambulatory Visit: Payer: Self-pay

## 2021-03-25 ENCOUNTER — Ambulatory Visit: Payer: BC Managed Care – PPO | Attending: Adult Health | Admitting: Physical Therapy

## 2021-03-25 ENCOUNTER — Encounter: Payer: Self-pay | Admitting: Physical Therapy

## 2021-03-25 DIAGNOSIS — I890 Lymphedema, not elsewhere classified: Secondary | ICD-10-CM | POA: Insufficient documentation

## 2021-03-25 DIAGNOSIS — C50911 Malignant neoplasm of unspecified site of right female breast: Secondary | ICD-10-CM | POA: Insufficient documentation

## 2021-03-25 DIAGNOSIS — R293 Abnormal posture: Secondary | ICD-10-CM | POA: Insufficient documentation

## 2021-03-25 DIAGNOSIS — Z170 Estrogen receptor positive status [ER+]: Secondary | ICD-10-CM | POA: Insufficient documentation

## 2021-03-25 DIAGNOSIS — M62838 Other muscle spasm: Secondary | ICD-10-CM | POA: Diagnosis present

## 2021-03-25 DIAGNOSIS — M25612 Stiffness of left shoulder, not elsewhere classified: Secondary | ICD-10-CM | POA: Insufficient documentation

## 2021-03-25 NOTE — Therapy (Signed)
[place] @ [place], [place], [address] Phone: [phone]   Fax:  [phone]  Physical Therapy Evaluation  Patient Details  Name: Holly Allison MRN: [id number] Date of Birth: 16-Nov-1965 Referring Provider (PT): Dr. [name]   Encounter Date: 03/25/2021   PT End of Session - 03/25/21 1556     Visit Number 1    Number of Visits 9    Date for PT Re-Evaluation 04/22/21    PT Start Time 1500    PT Stop Time 1545    PT Time Calculation (min) 45 min    Activity Tolerance Patient tolerated treatment well    Behavior During Therapy [facility] for tasks assessed/performed             Past Medical History:  Diagnosis Date   Angio-edema    Cancer [facility])    Family history of adverse reaction to anesthesia    Mother    Heart murmur    Since childhood   Pre-diabetes    Patient states she was told she is prediabetic at yearly physical two months ago. Patient states her A1 C was around 5.6   Urticaria     Past Surgical History:  Procedure Laterality Date   AXILLARY SENTINEL NODE BIOPSY Left 08/14/2020   Procedure: LEFT AXILLARY SENTINEL NODE BIOPSY;  Surgeon: Rolm Bookbinder, MD;  Location: [place];  Service: General;  Laterality: Left;   BREAST LUMPECTOMY WITH RADIOACTIVE SEED AND SENTINEL LYMPH NODE BIOPSY Left 08/14/2020   Procedure: LEFT BREAST LUMPECTOMY WITH RADIOACTIVE SEED;  Surgeon: Rolm Bookbinder, MD;  Location: [facility];  Service: General;  Laterality: Left;   CESAREAN SECTION     TONSILLECTOMY      There were no vitals filed for this visit.    Subjective Assessment - 03/25/21 1501     Subjective I have been trying to do what I was taught. It gets better but then it gets worse. It is moving around towards my back.    Pertinent History Left breast cancer with scheduled lumpectomy with SLNB with radiation.  Cancer is ER positive.  No other health problems. Surgery performed on 08/14/2020, 0/4 LN's  Invasive and in situLobular carcinoma.  She will have radiation in [place].    Patient Stated Goals to learn how to keep lymphedema down    Currently in Pain? No/denies    Multiple Pain Sites No                [facility] PT Assessment - 03/25/21 0001       Assessment   Medical Diagnosis left breast cancer    Referring Provider (PT) Dr. [name]    Onset Date/Surgical Date 08/05/20    Hand Dominance Right    Prior Therapy 2 lymphedema treatments for L breast swelling      Precautions   Precautions Other (comment)    Precaution Comments lymphedema      Restrictions   Weight Bearing Restrictions No      Balance Screen   Has the patient fallen in the past 6 months No    Has the patient had a decrease in activity level because of a fear of falling?  No    Is the patient reluctant to leave their home because of a fear of falling?  No      Home Environment   Living Environment Private residence    Living Arrangements Spouse/significant other;Children    Available Help at Discharge Family  Type of Home House      Prior Function   Level of Independence Independent    Vocation Full time employment    Vocation Requirements phlebotomist    Leisure walks 3 miles 3-4x/wk sometimes everyday      Cognition   Overall Cognitive Status Within Functional Limits for tasks assessed      Posture/Postural Control   Posture/Postural Control Postural limitations    Postural Limitations Rounded Shoulders;Forward head      AROM   Overall AROM Comments BIlateral shoulder ROM but pt has tightness at end range on L    Left Shoulder Extension --    Left Shoulder Flexion --    Left Shoulder ABduction --    Left Shoulder Internal Rotation --      Palpation   Palpation comment fullness palpable at lateral and inferior breast, post radiation changes  noted as evidenced by redness, tightness palpable in serratus and lats with trigger points present                L-DEX FLOWSHEETS -  03/25/21 1500       L-DEX LYMPHEDEMA SCREENING   Measurement Type Unilateral    L-DEX MEASUREMENT EXTREMITY Upper Extremity    POSITION  Standing    DOMINANT SIDE Right    At Risk Side Left    BASELINE SCORE (UNILATERAL) 1.6    L-DEX SCORE (UNILATERAL) -0.8    VALUE CHANGE (UNILAT) -2.4            The patient was assessed using the L-Dex machine today to produce a lymphedema index baseline score. The patient will be reassessed on a regular basis (typically every 3 months) to obtain new L-Dex scores. If the score is > 6.5 points away from his/her baseline score indicating onset of subclinical lymphedema, it will be recommended to wear a compression garment for 4 weeks, 12 hours per day and then be reassessed. If the score continues to be > 6.5 points from baseline at reassessment, we will initiate lymphedema treatment. Assessing in this manner has a 95% rate of preventing clinically significant lymphedema.         Objective measurements completed on examination: See above findings.       [facility] Adult PT Treatment/Exercise - 03/25/21 0001       Manual Therapy   Manual Therapy Soft tissue mobilization    Edema Management educated pt to wear her breast pad in her current bra while awaiting her compression bra; issued info on compression pump and pt requests demographics be sent to [place]    Soft tissue mobilization in R sidelying to L lateral trunk using cocoa butter in area of lats and serratus where pt had numerous areas of muscle tightness and knots that improved with therapy                          PT Long Term Goals - 03/25/21 1558       PT LONG TERM GOAL #1   Title Pt will obtain an appropriate compression bra for long term management of breast lymphedema.    Time 4    Period Weeks    Status New    Target Date 04/22/21      PT LONG TERM GOAL #2   Title Pt will report she is able to raise left arm to end range flexion and abduction without  increased pain across pec or serratus/lats.    Time 4  Period Weeks    Status New    Target Date 04/22/21      PT LONG TERM GOAL #3   Title Pt will be independent in self MLD for long term management of lymphedema.    Time 4    Period Weeks    Status New    Target Date 04/22/21      PT LONG TERM GOAL #4   Title Pt will receive trail of FlexiTouch compression pump for long term management of lymphedema.    Time 4    Period Weeks    Status New    Target Date 04/22/21                    Plan - 03/25/21 1559     Clinical Impression Statement Pt reports to PT with L breast lymphedema. She has increased tightness and muscle spasm in L lateral trunk in area of serratus and lats as well as L pec that causes tightness with end range L shoulder flexion and abduction. Pt does not have a compression bra so issued info to pt on how to obtain a compression bra for long term management of lymphedema. Pt was interested in a compression pump for long term management as well. Issued info regarding this and faxed demographics to [place] per pt request. Pt would benefit from skilled PT services to decrease muscle tightness, decrease left breast swelling and assist pt with obtaining appropriate compression garment.    Stability/Clinical Decision Making Stable/Uncomplicated    Clinical Decision Making Low    Rehab Potential Good    PT Frequency 2x / week    PT Duration 4 weeks    PT Treatment/Interventions ADLs/Self Care Home Management;Therapeutic exercise;Manual lymph drainage;Scar mobilization;Passive range of motion;Patient/family education;Manual techniques;Taping;Vasopneumatic Device    PT Next Visit Plan begin MLD to L breast and instruct, STM to L lats, serratus and pec, see if pt got app[facility] for bra    Recommended Other Services sent rx for pt to get bra [facility], sent demographics to [employer]    Consulted and Agree with Plan of Care Patient              Patient will benefit from skilled therapeutic intervention in order to improve the following deficits and impairments:  Pain, Postural dysfunction, Increased fascial restricitons, Increased edema, Increased muscle spasms  Visit Diagnosis: Lymphedema, not elsewhere classified  Other muscle spasm  Stiffness of left shoulder, not elsewhere classified  Abnormal posture  Malignant neoplasm of right breast in female, estrogen receptor positive, unspecified site of breast [facility])     Problem List Patient Active Problem List   Diagnosis Date Noted   Genetic testing 08/08/2020   Malignant neoplasm of upper-outer quadrant of left breast in female, estrogen receptor positive ([place]) 08/05/2020   Allergic rhinitis 07/29/2015   Angioedema 07/29/2015   Acute urticaria 08/31/2014   Allergic reaction 08/30/2014   Urticaria, acute 08/30/2014   Urticaria 08/30/2014   GERD (gastroesophageal reflux disease) 10/21/2012   Other pancytopenia ([place]) 10/21/2012   Hypotension, unspecified 10/20/2012   Dehydration 10/20/2012   Tick-borne disease 10/20/2012   Leukopenia 10/19/2012   Thrombocytopenia ([place]) 10/19/2012   Elevated transaminase level 10/19/2012   Tachycardia 10/19/2012   Hypokalemia 10/19/2012    Manus Gunning, PT 03/25/2021, 4:03 PM  [place] @ [place], [place], [address] Phone: [phone]   Fax:  [phone]  Name:  Holly Allison MRN: [id number] Date of Birth: Mar 18, 1966

## 2021-03-31 ENCOUNTER — Other Ambulatory Visit: Payer: Self-pay

## 2021-03-31 ENCOUNTER — Ambulatory Visit: Payer: BC Managed Care – PPO

## 2021-03-31 DIAGNOSIS — M62838 Other muscle spasm: Secondary | ICD-10-CM

## 2021-03-31 DIAGNOSIS — M25612 Stiffness of left shoulder, not elsewhere classified: Secondary | ICD-10-CM

## 2021-03-31 DIAGNOSIS — I890 Lymphedema, not elsewhere classified: Secondary | ICD-10-CM | POA: Diagnosis not present

## 2021-03-31 DIAGNOSIS — C50911 Malignant neoplasm of unspecified site of right female breast: Secondary | ICD-10-CM

## 2021-03-31 DIAGNOSIS — R293 Abnormal posture: Secondary | ICD-10-CM

## 2021-03-31 NOTE — Therapy (Signed)
[place] @ [place], [place], [address] Phone: [phone]   Fax:  [phone]  Physical Therapy Treatment  Patient Details  Name: Holly Allison MRN: [id number] Date of Birth: 25-Aug-1965 Referring Provider (PT): Dr. [name]   Encounter Date: 03/31/2021   PT End of Session - 03/31/21 1656     Visit Number 2    Number of Visits 9    Date for PT Re-Evaluation 04/22/21    PT Start Time 1604    PT Stop Time 1652    PT Time Calculation (min) 48 min    Activity Tolerance Patient tolerated treatment well    Behavior During Therapy [facility] for tasks assessed/performed             Past Medical History:  Diagnosis Date   Angio-edema    Cancer ([place])    Family history of adverse reaction to anesthesia    Mother    Heart murmur    Since childhood   Pre-diabetes    Patient states she was told she is prediabetic at yearly physical two months ago. Patient states her A1 C was around 5.6   Urticaria     Past Surgical History:  Procedure Laterality Date   AXILLARY SENTINEL NODE BIOPSY Left 08/14/2020   Procedure: LEFT AXILLARY SENTINEL NODE BIOPSY;  Surgeon: Rolm Bookbinder, MD;  Location: [place];  Service: General;  Laterality: Left;   BREAST LUMPECTOMY WITH RADIOACTIVE SEED AND SENTINEL LYMPH NODE BIOPSY Left 08/14/2020   Procedure: LEFT BREAST LUMPECTOMY WITH RADIOACTIVE SEED;  Surgeon: Rolm Bookbinder, MD;  Location: [place];  Service: General;  Laterality: Left;   CESAREAN SECTION     TONSILLECTOMY      There were no vitals filed for this visit.   Subjective Assessment - 03/31/21 1605     Subjective I have forgotten to make appt [facility].  My nipple still gets really white when I raise my arm and the color returns quickly. I have been doing the MLD and it does get softer, but its not as good as when you do it. I feel like the swelling has started going toward my back.    Pertinent History Left  breast cancer with scheduled lumpectomy with SLNB with radiation.  Cancer is ER positive.  No other health problems. Surgery performed on 08/14/2020, 0/4 LN's Invasive and in situLobular carcinoma.  She will have radiation in [place].    Patient Stated Goals to learn how to keep lymphedema down    Currently in Pain? No/denies    Multiple Pain Sites No                               [facility] Adult PT Treatment/Exercise - 03/31/21 0001       Manual Therapy   Manual Therapy Soft tissue mobilization;Manual Lymphatic Drainage (MLD)    Soft tissue mobilization in supine to pectoralis and lats and in R sidelying to L lateral trunk using cocoa butter.Pt had numerous areas of muscle tightness and knots that improved with therapy    Manual Lymphatic Drainage (MLD) supine with head of table elevated; short neck, 5 diaphragmatic breaths, bilateral axillary LN's, left inguinal LN's, anterior interaxillaxillary pathway, left axilloinguinal pathway, and left breast directing to pathways and retracing all steps, ending with LN's. Had pt repeat all techniques and gave handout.  PT Education - 03/31/21 1659     Education Details Pt was educated in left breast MLD    Person(s) Educated Patient    Methods Explanation;Demonstration;Tactile cues;Verbal cues;Handout    Comprehension Returned demonstration;Tactile cues required;Verbal cues required;Need further instruction                 PT Long Term Goals - 03/25/21 1558       PT LONG TERM GOAL #1   Title Pt will obtain an appropriate compression bra for long term management of breast lymphedema.    Time 4    Period Weeks    Status New    Target Date 04/22/21      PT LONG TERM GOAL #2   Title Pt will report she is able to raise left arm to end range flexion and abduction without increased pain across pec or serratus/lats.    Time 4    Period Weeks    Status New    Target Date 04/22/21      PT LONG  TERM GOAL #3   Title Pt will be independent in self MLD for long term management of lymphedema.    Time 4    Period Weeks    Status New    Target Date 04/22/21      PT LONG TERM GOAL #4   Title Pt will receive trail of FlexiTouch compression pump for long term management of lymphedema.    Time 4    Period Weeks    Status New    Target Date 04/22/21                   Plan - 03/31/21 1657     Clinical Impression Statement Continued soft tissue mobilization to left upper quarter with tenderness and tighness noted throughout.  Initiated MLD and had pt. practice techniques.  She required VC's and TC's for stretch, pressure and sequence but did improve significantly with practice and did very well overall.  She will continue to practice at home.  She as given a written and illustrated handout.    Stability/Clinical Decision Making Stable/Uncomplicated    Rehab Potential Good    PT Frequency 2x / week    PT Duration 4 weeks    PT Treatment/Interventions ADLs/Self Care Home Management;Therapeutic exercise;Manual lymph drainage;Scar mobilization;Passive range of motion;Patient/family education;Manual techniques;Taping;Vasopneumatic Device    PT Next Visit Plan begin MLD to L breast and instruct, STM to L lats, serratus and pec, [facility] for bra ?   Consulted and Agree with Plan of Care Patient             Patient will benefit from skilled therapeutic intervention in order to improve the following deficits and impairments:     Visit Diagnosis: Lymphedema, not elsewhere classified  Other muscle spasm  Stiffness of left shoulder, not elsewhere classified  Abnormal posture  Malignant neoplasm of right breast in female, estrogen receptor positive, unspecified site of breast [facility])     Problem List Patient Active Problem List   Diagnosis Date Noted   Genetic testing 08/08/2020   Malignant neoplasm of upper-outer quadrant of left breast in female,  estrogen receptor positive ([place]) 08/05/2020   Allergic rhinitis 07/29/2015   Angioedema 07/29/2015   Acute urticaria 08/31/2014   Allergic reaction 08/30/2014   Urticaria, acute 08/30/2014   Urticaria 08/30/2014   GERD (gastroesophageal reflux disease) 10/21/2012   Other pancytopenia ([place]) 10/21/2012   Hypotension, unspecified 10/20/2012  Dehydration 10/20/2012   Tick-borne disease 10/20/2012   Leukopenia 10/19/2012   Thrombocytopenia ([facility]) 10/19/2012   Elevated transaminase level 10/19/2012   Tachycardia 10/19/2012   Hypokalemia 10/19/2012    Claris Pong, PT 03/31/2021, 5:09 PM  [place] @ [place], [place], [address] Phone: [phone]   Fax:  [phone]  Name: Holly Allison MRN: [id number] Date of Birth: [birth year]

## 2021-03-31 NOTE — Patient Instructions (Signed)
Pt was given a written and illustrated handout for left breast MLD

## 2021-04-02 NOTE — Progress Notes (Signed)
Faxed prescription to [phone], [employer] for pneumatic compression device.

## 2021-04-07 ENCOUNTER — Encounter: Payer: Self-pay | Admitting: Rehabilitation

## 2021-04-07 ENCOUNTER — Ambulatory Visit: Payer: BC Managed Care – PPO | Admitting: Rehabilitation

## 2021-04-07 ENCOUNTER — Other Ambulatory Visit: Payer: Self-pay

## 2021-04-07 DIAGNOSIS — R293 Abnormal posture: Secondary | ICD-10-CM

## 2021-04-07 DIAGNOSIS — I890 Lymphedema, not elsewhere classified: Secondary | ICD-10-CM | POA: Diagnosis not present

## 2021-04-07 DIAGNOSIS — M62838 Other muscle spasm: Secondary | ICD-10-CM

## 2021-04-07 DIAGNOSIS — C50911 Malignant neoplasm of unspecified site of right female breast: Secondary | ICD-10-CM

## 2021-04-07 DIAGNOSIS — M25612 Stiffness of left shoulder, not elsewhere classified: Secondary | ICD-10-CM

## 2021-04-07 NOTE — Therapy (Signed)
[facility] @ [place], [place], [address] Phone: [phone]   Fax:  [phone]  Physical Therapy Treatment  Patient Details  Name: Holly Allison MRN: [id number] Date of Birth: 14-Jul-1965 Referring Provider (PT): Dr. [name]   Encounter Date: 04/07/2021   PT End of Session - 04/07/21 1656     Visit Number 3    Number of Visits 9    Date for PT Re-Evaluation 04/22/21    PT Start Time 1605   finishing up with flex demo   PT Stop Time 1653    PT Time Calculation (min) 48 min    Activity Tolerance Patient tolerated treatment well    Behavior During Therapy [facility] for tasks assessed/performed             Past Medical History:  Diagnosis Date   Angio-edema    Cancer ([place])    Family history of adverse reaction to anesthesia    Mother    Heart murmur    Since childhood   Pre-diabetes    Patient states she was told she is prediabetic at yearly physical two months ago. Patient states her A1 C was around 5.6   Urticaria     Past Surgical History:  Procedure Laterality Date   AXILLARY SENTINEL NODE BIOPSY Left 08/14/2020   Procedure: LEFT AXILLARY SENTINEL NODE BIOPSY;  Surgeon: Rolm Bookbinder, MD;  Location: [place];  Service: General;  Laterality: Left;   BREAST LUMPECTOMY WITH RADIOACTIVE SEED AND SENTINEL LYMPH NODE BIOPSY Left 08/14/2020   Procedure: LEFT BREAST LUMPECTOMY WITH RADIOACTIVE SEED;  Surgeon: Rolm Bookbinder, MD;  Location: [place];  Service: General;  Laterality: Left;   CESAREAN SECTION     TONSILLECTOMY      There were no vitals filed for this visit.   Subjective Assessment - 04/07/21 1604     Subjective Still no bra appointment.  I think the swelling is a bit better today and the tightness is also better.  I'm don't think I am going to get the pump right away.  My insurance started over in oct and I would have to pay 3500    Pertinent History Left breast cancer with scheduled  lumpectomy with SLNB with radiation.  Cancer is ER positive.  No other health problems. Surgery performed on 08/14/2020, 0/4 LN's Invasive and in situLobular carcinoma.  She will have radiation in [place].    Currently in Pain? No/denies                               [facility] Adult PT Treatment/Exercise - 04/07/21 0001       Manual Therapy   Soft tissue mobilization in supine to pectoralis and lats and in R sidelying to L lateral trunk using cocoa butter.    Manual Lymphatic Drainage (MLD) supine with head of table elevated; short neck, 5 diaphragmatic breaths, bilateral axillary LN's, left inguinal LN's, anterior interaxillaxillary pathway, left axilloinguinal pathway, and left breast directing to pathways and retracing all steps, ending with LN's. Then posterior interaxillary work in [place].                          PT Long Term Goals - 03/25/21 1558       PT LONG TERM GOAL #1   Title Pt will obtain an appropriate compression bra for long term management of breast lymphedema.  Time 4    Period Weeks    Status New    Target Date 04/22/21      PT LONG TERM GOAL #2   Title Pt will report she is able to raise left arm to end range flexion and abduction without increased pain across pec or serratus/lats.    Time 4    Period Weeks    Status New    Target Date 04/22/21      PT LONG TERM GOAL #3   Title Pt will be independent in self MLD for long term management of lymphedema.    Time 4    Period Weeks    Status New    Target Date 04/22/21      PT LONG TERM GOAL #4   Title Pt will receive trail of FlexiTouch compression pump for long term management of lymphedema.    Time 4    Period Weeks    Status New    Target Date 04/22/21                   Plan - 04/07/21 1656     Clinical Impression Statement pt reporting improvements already.  Still has fibrosis inferior breast and overall edema but less muscle tension and pull with end range  PROM.  Pt had flexitouch demo today which she liked by will probably not pursue due to cost.  Also wants to see if it just keeps feeling better as it has already started to feel improved.    PT Frequency 2x / week    PT Duration 4 weeks    PT Treatment/Interventions ADLs/Self Care Home Management;Therapeutic exercise;Manual lymph drainage;Scar mobilization;Passive range of motion;Patient/family education;Manual techniques;Taping;Vasopneumatic Device    PT Next Visit Plan MLD to L breast and cont instruction, STM to L lats, serratus and pec,    Consulted and Agree with Plan of Care Patient             Patient will benefit from skilled therapeutic intervention in order to improve the following deficits and impairments:     Visit Diagnosis: Lymphedema, not elsewhere classified  Other muscle spasm  Stiffness of left shoulder, not elsewhere classified  Abnormal posture  Malignant neoplasm of right breast in female, estrogen receptor positive, unspecified site of breast [facility])     Problem List Patient Active Problem List   Diagnosis Date Noted   Genetic testing 08/08/2020   Malignant neoplasm of upper-outer quadrant of left breast in female, estrogen receptor positive ([place]) 08/05/2020   Allergic rhinitis 07/29/2015   Angioedema 07/29/2015   Acute urticaria 08/31/2014   Allergic reaction 08/30/2014   Urticaria, acute 08/30/2014   Urticaria 08/30/2014   GERD (gastroesophageal reflux disease) 10/21/2012   Other pancytopenia ([place]) 10/21/2012   Hypotension, unspecified 10/20/2012   Dehydration 10/20/2012   Tick-borne disease 10/20/2012   Leukopenia 10/19/2012   Thrombocytopenia ([place]) 10/19/2012   Elevated transaminase level 10/19/2012   Tachycardia 10/19/2012   Hypokalemia 10/19/2012    Stark Bray, PT 04/07/2021, 4:59 PM  [place] @ [place], [place], [address] Phone: [phone]   Fax:   [phone]  Name: Holly Allison MRN: [id number] Date of Birth: 1965-10-13

## 2021-04-14 ENCOUNTER — Ambulatory Visit: Payer: BC Managed Care – PPO | Attending: Adult Health

## 2021-04-14 ENCOUNTER — Other Ambulatory Visit: Payer: Self-pay

## 2021-04-14 DIAGNOSIS — M62838 Other muscle spasm: Secondary | ICD-10-CM | POA: Insufficient documentation

## 2021-04-14 DIAGNOSIS — R293 Abnormal posture: Secondary | ICD-10-CM | POA: Diagnosis present

## 2021-04-14 DIAGNOSIS — Z170 Estrogen receptor positive status [ER+]: Secondary | ICD-10-CM | POA: Insufficient documentation

## 2021-04-14 DIAGNOSIS — M25612 Stiffness of left shoulder, not elsewhere classified: Secondary | ICD-10-CM | POA: Diagnosis present

## 2021-04-14 DIAGNOSIS — I890 Lymphedema, not elsewhere classified: Secondary | ICD-10-CM | POA: Insufficient documentation

## 2021-04-14 DIAGNOSIS — N6489 Other specified disorders of breast: Secondary | ICD-10-CM | POA: Insufficient documentation

## 2021-04-14 DIAGNOSIS — C50911 Malignant neoplasm of unspecified site of right female breast: Secondary | ICD-10-CM | POA: Insufficient documentation

## 2021-04-14 NOTE — Therapy (Signed)
[facility] @ [place], [place], [address] Phone: [phone]   Fax:  [phone]  Physical Therapy Treatment  Patient Details  Name: Holly Allison MRN: [id number] Date of Birth: 1965-05-22 Referring Provider (PT): Dr. [name]   Encounter Date: 04/14/2021   PT End of Session - 04/14/21 1609     Visit Number 4    Number of Visits 9    Date for PT Re-Evaluation 04/22/21    PT Start Time 1605    PT Stop Time 1700    PT Time Calculation (min) 55 min    Activity Tolerance Patient tolerated treatment well    Behavior During Therapy [facility] for tasks assessed/performed             Past Medical History:  Diagnosis Date   Angio-edema    Cancer [facility])    Family history of adverse reaction to anesthesia    Mother    Heart murmur    Since childhood   Pre-diabetes    Patient states she was told she is prediabetic at yearly physical two months ago. Patient states her A1 C was around 5.6   Urticaria     Past Surgical History:  Procedure Laterality Date   AXILLARY SENTINEL NODE BIOPSY Left 08/14/2020   Procedure: LEFT AXILLARY SENTINEL NODE BIOPSY;  Surgeon: Rolm Bookbinder, MD;  Location: [facility];  Service: General;  Laterality: Left;   BREAST LUMPECTOMY WITH RADIOACTIVE SEED AND SENTINEL LYMPH NODE BIOPSY Left 08/14/2020   Procedure: LEFT BREAST LUMPECTOMY WITH RADIOACTIVE SEED;  Surgeon: Rolm Bookbinder, MD;  Location: [facility];  Service: General;  Laterality: Left;   CESAREAN SECTION     TONSILLECTOMY      There were no vitals filed for this visit.   Subjective Assessment - 04/14/21 1604     Subjective Jeris Penta t to [facility] [place] today and got 2 compression bras.  It feels comfortable but I have only worn 2 hours. The tightness is feeling much better and I  have been doing the MLD    Pertinent History Left breast cancer with scheduled lumpectomy with SLNB with radiation.  Cancer is ER positive.  No other health  problems. Surgery performed on 08/14/2020, 0/4 LN's Invasive and in situLobular carcinoma.  She will have radiation in [place].    Patient Stated Goals to learn how to keep lymphedema down    Currently in Pain? No/denies    Multiple Pain Sites No                               [facility] Adult PT Treatment/Exercise - 04/14/21 0001       Shoulder Exercises: Supine   Other Supine Exercises supine AROM flexion, scaption, horizontal abd x 5 B      Manual Therapy   Soft tissue mobilization in supine to pectoralis and lats, and UT using cocoa butter.    Manual Lymphatic Drainage (MLD) supine with head of table elevated; short neck, 5 diaphragmatic breaths, bilateral axillary LN's, left inguinal LN's, anterior interaxillaxillary pathway, left axilloinguinal pathway, and left breast directing to pathways and retracing all steps, ending with LN's. Pt return demonstrated all steps.    Passive ROM PROM left shoulder flexion, scaption, D2 flexion                          PT Long Term Goals -  04/14/21 1656       PT LONG TERM GOAL #1   Title Pt will obtain an appropriate compression bra for long term management of breast lymphedema.    Time 4    Period Weeks    Status Achieved    Target Date 04/14/21      PT LONG TERM GOAL #2   Title Pt will report she is able to raise left arm to end range flexion and abduction without increased pain across pec or serratus/lats.    Time 4    Period Weeks    Status Achieved    Target Date 04/14/21      PT LONG TERM GOAL #3   Title Pt will be independent in self MLD for long term management of lymphedema.    Time 4    Period Weeks    Status On-going    Target Date 04/22/21      PT LONG TERM GOAL #4   Title Pt will receive trail of FlexiTouch compression pump for long term management of lymphedema.    Time 4    Period Weeks    Status Deferred   due to cost     PT LONG TERM GOAL #5   Title Pt will have decreased left breast  heaviness/swelling by 50%    Baseline 60 % better    Time 4    Period Weeks    Status Achieved    Target Date 04/22/21                   Plan - 04/14/21 1610     Clinical Impression Statement pt purchased her bras today and said they are very comfortable and she feels they will help alot. continued soft tissue mobilization, and MLD with review of MLD by pt following therapist instruction.  Pt did exceptionally well with very few cues needed.  AROM and PROM is progressing nicely and breast heaviness is 60% better overall. Pt has  nearly achieved all goals and nearly achieved MLD goal.  We will review next week and likely DC next visit if pt continues to do well.    Stability/Clinical Decision Making Stable/Uncomplicated    Rehab Potential Good    PT Frequency 2x / week    PT Duration 4 weeks    PT Treatment/Interventions ADLs/Self Care Home Management;Therapeutic exercise;Manual lymph drainage;Scar mobilization;Passive range of motion;Patient/family education;Manual techniques;Taping;Vasopneumatic Device    PT Next Visit Plan ReviewMLD to L breast , STM to L lats, serratus and pec prn, DC next visit if ready, foam in bra?   Consulted and Agree with Plan of Care Patient             Patient will benefit from skilled therapeutic intervention in order to improve the following deficits and impairments:  Pain, Postural dysfunction, Increased fascial restricitons, Increased edema, Increased muscle spasms  Visit Diagnosis: Lymphedema, not elsewhere classified  Other muscle spasm  Stiffness of left shoulder, not elsewhere classified  Abnormal posture  Malignant neoplasm of right breast in female, estrogen receptor positive, unspecified site of breast ([place])  Edema of breast     Problem List Patient Active Problem List   Diagnosis Date Noted   Genetic testing 08/08/2020   Malignant neoplasm of upper-outer quadrant of left breast in female, estrogen receptor positive ([place])  08/05/2020   Allergic rhinitis 07/29/2015   Angioedema 07/29/2015   Acute urticaria 08/31/2014   Allergic reaction 08/30/2014   Urticaria, acute 08/30/2014   Urticaria 08/30/2014  GERD (gastroesophageal reflux disease) 10/21/2012   Other pancytopenia ([place]) 10/21/2012   Hypotension, unspecified 10/20/2012   Dehydration 10/20/2012   Tick-borne disease 10/20/2012   Leukopenia 10/19/2012   Thrombocytopenia ([place]) 10/19/2012   Elevated transaminase level 10/19/2012   Tachycardia 10/19/2012   Hypokalemia 10/19/2012    Claris Pong, PT 04/14/2021, 5:09 PM  [facility] @ [place], [place], [address] Phone: [phone]   Fax:  [phone]  Name: Holly Allison MRN: [id number] Date of Birth: November 08, 1965

## 2021-04-21 ENCOUNTER — Ambulatory Visit: Payer: BC Managed Care – PPO

## 2021-04-21 ENCOUNTER — Other Ambulatory Visit: Payer: Self-pay

## 2021-04-21 DIAGNOSIS — R293 Abnormal posture: Secondary | ICD-10-CM

## 2021-04-21 DIAGNOSIS — M25612 Stiffness of left shoulder, not elsewhere classified: Secondary | ICD-10-CM

## 2021-04-21 DIAGNOSIS — N6489 Other specified disorders of breast: Secondary | ICD-10-CM

## 2021-04-21 DIAGNOSIS — I890 Lymphedema, not elsewhere classified: Secondary | ICD-10-CM | POA: Diagnosis not present

## 2021-04-21 DIAGNOSIS — M62838 Other muscle spasm: Secondary | ICD-10-CM

## 2021-04-21 NOTE — Therapy (Signed)
[facility] @ [place], [place], [address] Phone: [phone]   Fax:  [phone]  Physical Therapy Treatment  Patient Details  Name: Holly Allison MRN: [id number] Date of Birth: 09/01/1965 Referring Provider (PT): Dr. [name]   Encounter Date: 04/21/2021   PT End of Session - 04/21/21 1506     Visit Number 5    Number of Visits 9    Date for PT Re-Evaluation 04/22/21    PT Start Time 1504    PT Stop Time 1550    PT Time Calculation (min) 46 min    Activity Tolerance Patient tolerated treatment well    Behavior During Therapy [facility] for tasks assessed/performed             Past Medical History:  Diagnosis Date   Angio-edema    Cancer [facility])    Family history of adverse reaction to anesthesia    Mother    Heart murmur    Since childhood   Pre-diabetes    Patient states she was told she is prediabetic at yearly physical two months ago. Patient states her A1 C was around 5.6   Urticaria     Past Surgical History:  Procedure Laterality Date   AXILLARY SENTINEL NODE BIOPSY Left 08/14/2020   Procedure: LEFT AXILLARY SENTINEL NODE BIOPSY;  Surgeon: Rolm Bookbinder, MD;  Location: [facility];  Service: General;  Laterality: Left;   BREAST LUMPECTOMY WITH RADIOACTIVE SEED AND SENTINEL LYMPH NODE BIOPSY Left 08/14/2020   Procedure: LEFT BREAST LUMPECTOMY WITH RADIOACTIVE SEED;  Surgeon: Rolm Bookbinder, MD;  Location: [facility];  Service: General;  Laterality: Left;   CESAREAN SECTION     TONSILLECTOMY      There were no vitals filed for this visit.   Subjective Assessment - 04/21/21 1500     Subjective Still like the bras.  I feel like it has really reduced the swelling. Feel like ROM and tightness is doing pretty well    Pertinent History Left breast cancer with scheduled lumpectomy with SLNB with radiation.  Cancer is ER positive.  No other health problems. Surgery performed on 08/14/2020, 0/4 LN's Invasive  and in situLobular carcinoma.  She will have radiation in [place].    Patient Stated Goals to learn how to keep lymphedema down    Currently in Pain? No/denies                               [facility] Adult PT Treatment/Exercise - 04/21/21 0001       Manual Therapy   Soft tissue mobilization in supine to pectoralis and lats, and UT using cocoa butter.    Manual Lymphatic Drainage (MLD) supine with head of table elevated; short neck, 5 diaphragmatic breaths, bilateral axillary LN's, left inguinal LN's, anterior interaxillaxillary pathway, left axilloinguinal pathway, and left breast directing to pathways and retracing all steps, ending with LN's. Pt return demonstrated all steps.    Passive ROM PROM left shoulder flexion, scaption, D2 flexion                          PT Long Term Goals - 04/21/21 1545       PT LONG TERM GOAL #1   Title Pt will obtain an appropriate compression bra for long term management of breast lymphedema.    Time 4    Period Weeks  Status Achieved    Target Date 04/14/21      PT LONG TERM GOAL #2   Title Pt will report she is able to raise left arm to end range flexion and abduction without increased pain across pec or serratus/lats.    Time 4    Period Weeks    Status Achieved    Target Date 04/14/21      PT LONG TERM GOAL #3   Title Pt will be independent in self MLD for long term management of lymphedema.    Time 4    Period Weeks    Status Achieved    Target Date 04/21/21      PT LONG TERM GOAL #4   Title Pt will receive trail of FlexiTouch compression pump for long term management of lymphedema.    Time 4    Period Weeks    Status Deferred      PT LONG TERM GOAL #5   Title Pt will have decreased left breast heaviness/swelling by 50%    Time 4    Period Weeks    Status Achieved    Target Date 04/21/21                   Plan - 04/21/21 1557     Clinical Impression Statement performed soft tissue  mobilization to left pecs,lats and UT, and PROM to left shoulder.  Reviewed all left breast MLD with pt. therapist read instructions while pt performed.  She did very well overall and has a good understanding of the seuquence and technique/stretch.  She has achieved all applicable goals except Flexitouch which was deferred secondary to cost, She knows to call or email with any questions or concerns.  She feels ready for discharge to HEP and independent self management.    Stability/Clinical Decision Making Stable/Uncomplicated    Rehab Potential Good    PT Frequency 2x / week    PT Duration 4 weeks    PT Treatment/Interventions ADLs/Self Care Home Management;Therapeutic exercise;Manual lymph drainage;Scar mobilization;Passive range of motion;Patient/family education;Manual techniques;Taping;Vasopneumatic Device    PT Next Visit Plan DC to independent self management    PT Home Exercise Plan self breast MLD    Consulted and Agree with Plan of Care Patient             Patient will benefit from skilled therapeutic intervention in order to improve the following deficits and impairments:  Pain, Postural dysfunction, Increased fascial restricitons, Increased edema, Increased muscle spasms  Visit Diagnosis: Lymphedema, not elsewhere classified  Stiffness of left shoulder, not elsewhere classified  Other muscle spasm  Abnormal posture  Edema of breast     Problem List Patient Active Problem List   Diagnosis Date Noted   Genetic testing 08/08/2020   Malignant neoplasm of upper-outer quadrant of left breast in female, estrogen receptor positive ([place]) 08/05/2020   Allergic rhinitis 07/29/2015   Angioedema 07/29/2015   Acute urticaria 08/31/2014   Allergic reaction 08/30/2014   Urticaria, acute 08/30/2014   Urticaria 08/30/2014   GERD (gastroesophageal reflux disease) 10/21/2012   Other pancytopenia ([place]) 10/21/2012   Hypotension, unspecified 10/20/2012   Dehydration 10/20/2012    Tick-borne disease 10/20/2012   Leukopenia 10/19/2012   Thrombocytopenia ([place]) 10/19/2012   Elevated transaminase level 10/19/2012   Tachycardia 10/19/2012   Hypokalemia 10/19/2012  PHYSICAL THERAPY DISCHARGE SUMMARY  Visits from Start of Care: 5  Current functional level related to goals / functional outcomes: Achieved goals established   Remaining deficits:  Mild left breast swelling   Education / Equipment: Compression bra   Patient agrees to discharge. Patient goals were met. Patient is being discharged due to meeting the stated rehab goals.   Claris Pong, PT 04/21/2021, 5:07 PM  [facility] @ [place], [place], [address] Phone: [phone]   Fax:  [phone]  Name: Holly Allison MRN: [id number] Date of Birth: [birth year]

## 2021-06-01 ENCOUNTER — Other Ambulatory Visit: Payer: Self-pay | Admitting: Hematology and Oncology

## 2021-06-09 ENCOUNTER — Encounter (HOSPITAL_COMMUNITY): Payer: Self-pay | Admitting: Hematology

## 2021-06-09 ENCOUNTER — Inpatient Hospital Stay (HOSPITAL_COMMUNITY): Payer: BC Managed Care – PPO

## 2021-06-09 ENCOUNTER — Inpatient Hospital Stay (HOSPITAL_COMMUNITY): Payer: BC Managed Care – PPO | Attending: Hematology | Admitting: Hematology

## 2021-06-09 ENCOUNTER — Other Ambulatory Visit: Payer: Self-pay

## 2021-06-09 VITALS — BP 151/86 | HR 87 | Temp 98.6°F | Resp 18 | Ht 64.0 in | Wt 184.0 lb

## 2021-06-09 DIAGNOSIS — C50412 Malignant neoplasm of upper-outer quadrant of left female breast: Secondary | ICD-10-CM | POA: Diagnosis not present

## 2021-06-09 DIAGNOSIS — R011 Cardiac murmur, unspecified: Secondary | ICD-10-CM | POA: Diagnosis not present

## 2021-06-09 DIAGNOSIS — Z803 Family history of malignant neoplasm of breast: Secondary | ICD-10-CM | POA: Diagnosis not present

## 2021-06-09 DIAGNOSIS — R140 Abdominal distension (gaseous): Secondary | ICD-10-CM | POA: Insufficient documentation

## 2021-06-09 DIAGNOSIS — R232 Flushing: Secondary | ICD-10-CM | POA: Insufficient documentation

## 2021-06-09 DIAGNOSIS — Z79811 Long term (current) use of aromatase inhibitors: Secondary | ICD-10-CM | POA: Diagnosis not present

## 2021-06-09 DIAGNOSIS — R002 Palpitations: Secondary | ICD-10-CM | POA: Insufficient documentation

## 2021-06-09 DIAGNOSIS — Z170 Estrogen receptor positive status [ER+]: Secondary | ICD-10-CM | POA: Insufficient documentation

## 2021-06-09 DIAGNOSIS — I890 Lymphedema, not elsewhere classified: Secondary | ICD-10-CM | POA: Diagnosis not present

## 2021-06-09 DIAGNOSIS — Z79899 Other long term (current) drug therapy: Secondary | ICD-10-CM | POA: Diagnosis not present

## 2021-06-09 DIAGNOSIS — Z923 Personal history of irradiation: Secondary | ICD-10-CM | POA: Insufficient documentation

## 2021-06-09 DIAGNOSIS — M25519 Pain in unspecified shoulder: Secondary | ICD-10-CM | POA: Diagnosis not present

## 2021-06-09 LAB — CBC WITH DIFFERENTIAL/PLATELET
Abs Immature Granulocytes: 0.02 10*3/uL (ref 0.00–0.07)
Basophils Absolute: 0 10*3/uL (ref 0.0–0.1)
Basophils Relative: 0 %
Eosinophils Absolute: 0.1 10*3/uL (ref 0.0–0.5)
Eosinophils Relative: 2 %
HCT: 34.5 % — ABNORMAL LOW (ref 36.0–46.0)
Hemoglobin: 11.5 g/dL — ABNORMAL LOW (ref 12.0–15.0)
Immature Granulocytes: 0 %
Lymphocytes Relative: 30 %
Lymphs Abs: 1.7 10*3/uL (ref 0.7–4.0)
MCH: 30.4 pg (ref 26.0–34.0)
MCHC: 33.3 g/dL (ref 30.0–36.0)
MCV: 91.3 fL (ref 80.0–100.0)
Monocytes Absolute: 0.5 10*3/uL (ref 0.1–1.0)
Monocytes Relative: 9 %
Neutro Abs: 3.2 10*3/uL (ref 1.7–7.7)
Neutrophils Relative %: 59 %
Platelets: 286 10*3/uL (ref 150–400)
RBC: 3.78 MIL/uL — ABNORMAL LOW (ref 3.87–5.11)
RDW: 12.7 % (ref 11.5–15.5)
WBC: 5.5 10*3/uL (ref 4.0–10.5)
nRBC: 0 % (ref 0.0–0.2)

## 2021-06-09 LAB — COMPREHENSIVE METABOLIC PANEL
ALT: 23 U/L (ref 0–44)
AST: 21 U/L (ref 15–41)
Albumin: 4.2 g/dL (ref 3.5–5.0)
Alkaline Phosphatase: 41 U/L (ref 38–126)
Anion gap: 9 (ref 5–15)
BUN: 13 mg/dL (ref 6–20)
CO2: 27 mmol/L (ref 22–32)
Calcium: 9.7 mg/dL (ref 8.9–10.3)
Chloride: 103 mmol/L (ref 98–111)
Creatinine, Ser: 0.82 mg/dL (ref 0.44–1.00)
GFR, Estimated: >60 mL/min (ref >60)
Glucose, Bld: 99 mg/dL (ref 70–99)
Potassium: 3.9 mmol/L (ref 3.5–5.1)
Sodium: 139 mmol/L (ref 135–145)
Total Bilirubin: 0.3 mg/dL (ref 0.3–1.2)
Total Protein: 7.8 g/dL (ref 6.5–8.1)

## 2021-06-09 LAB — VITAMIN D 25 HYDROXY (VIT D DEFICIENCY, FRACTURES): Vit D, 25-Hydroxy: 51 ng/mL (ref 30–100)

## 2021-06-09 NOTE — Progress Notes (Signed)
[name] [name]   Patient Care Team: Manon Hilding, MD as PCP - General (Cardiology) Adella Nissen Phil Dopp, MD as Referring Physician (Radiation Oncology) Nicholas Lose, MD as Consulting Physician (Hematology and Oncology) Rolm Bookbinder, MD as Consulting Physician (General Surgery) Derek Jack, MD as Medical Oncologist (Medical Oncology)  CHIEF COMPLAINTS/PURPOSE OF CONSULTATION:  Newly diagnosed left breast cancer  HISTORY OF PRESENTING ILLNESS:  Holly Allison 56 y.o. female is here because of recent diagnosis of left breast cancer.  Today she reports feeling well. She reports abdominal swelling in the afternoons starting 3-4 weeks ago following starting letrozole; she denies correlation to eating, and the swelling resolves by the evening. She denies recent weight loss. She was diagnosed with breast cancer last year. She reports joint pain in her hands and shoulders since starting letrozole. She reports palpitations. She also reports hot flashes since starting Effexor. She reports occasional lower abdominal pain. She denies history of gallstones. She takes calcium and vitamin D.   She works as a Technical brewer at [employer]. She denies smoking history. Her maternal grandmother had breast cancer at age 36, and her maternal great aunt had breast cancer.   In terms of breast cancer risk profile:  She has family history of Breast/GYN/GI cancer  I reviewed her records extensively and collaborated the history with the patient.  SUMMARY OF ONCOLOGIC HISTORY: Oncology History  Malignant neoplasm of upper-outer quadrant of left breast in female, estrogen receptor positive ([place])  07/16/2020 Initial Diagnosis   Screening mammogram detected possible asymmetries in the bilateral breasts. 0.5cm mass at the 2:30 position in the left breast, with no left axillary adenopathy, and no focal abnormalities in the right breast. Biopsy  showed invasive and in situ ductal carcinoma, grade 1, strong ER/PR positive, HER-2 negative, Ki67 <5%   08/05/2020 Cancer Staging   Staging form: Breast, AJCC 8th Edition - Clinical stage from 08/05/2020: Stage IA (cT1a, cN0, cM0, G1, ER+, PR+, HER2-) - Signed by Nicholas Lose, MD on 08/05/2020 Stage prefix: Initial diagnosis Histologic grading system: 3 grade system    08/07/2020 Genetic Testing   Negative genetic testing on the Common hereditary cancer panel.  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  The Common Hereditary Gene Panel offered by [facility] includes sequencing and/or deletion duplication testing of the following 47 genes: APC, ATM, AXIN2, BARD1, BMPR1A, BRCA1, BRCA2, BRIP1, CDH1, CDK4, CDKN2A (p14ARF), CDKN2A (p16INK4a), CHEK2, CTNNA1, DICER1, EPCAM (Deletion/duplication testing only), GREM1 (promoter region deletion/duplication testing only), KIT, MEN1, MLH1, MSH2, MSH3, MSH6, MUTYH, NBN, NF1, NHTL1, PALB2, PDGFRA, PMS2, POLD1, POLE, PTEN, RAD50, RAD51C, RAD51D, SDHB, SDHC, SDHD, SMAD4, SMARCA4. STK11, TP53, TSC1, TSC2, and VHL.  The following genes were evaluated for sequence changes only: SDHA and HOXB13 c.251G>A variant only. The report date is August 07, 2020.   08/14/2020 Surgery   Left lumpectomy [name]): invasive and in situ lobular carcinoma, 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    08/14/2020 Cancer Staging   Staging form: Breast, AJCC 8th Edition - Pathologic stage from 08/14/2020: Stage IA (pT1b, pN0, cM0, G1, ER+, PR+, HER2-) - Signed by Gardenia Phlegm, NP on 03/02/2021 Stage prefix: Initial diagnosis Histologic grading system: 3 grade system    10/01/2020 - 10/29/2020 Radiation Therapy   Dr. Adella Nissen in [place]: Standard radiation therapy to the left breast. This will consist of a dose of 4272 cGy in 16 fractions followed by a boost to the tumor bed  of an additional 1000 cGy in 4 fractions. Simulation and treatment treatment are  scheduled to begin as soon as possible.    11/2020 -  Anti-estrogen oral therapy   Letrozole daily     MEDICAL HISTORY:  Past Medical History:  Diagnosis Date   Angio-edema    Cancer ([place])    Family history of adverse reaction to anesthesia    Mother    Heart murmur    Since childhood   Pre-diabetes    Patient states she was told she is prediabetic at yearly physical two months ago. Patient states her A1 C was around 5.6   Urticaria     SURGICAL HISTORY: Past Surgical History:  Procedure Laterality Date   AXILLARY SENTINEL NODE BIOPSY Left 08/14/2020   Procedure: LEFT AXILLARY SENTINEL NODE BIOPSY;  Surgeon: Rolm Bookbinder, MD;  Location: [facility];  Service: General;  Laterality: Left;   BREAST LUMPECTOMY WITH RADIOACTIVE SEED AND SENTINEL LYMPH NODE BIOPSY Left 08/14/2020   Procedure: LEFT BREAST LUMPECTOMY WITH RADIOACTIVE SEED;  Surgeon: Rolm Bookbinder, MD;  Location: [facility];  Service: General;  Laterality: Left;   CESAREAN SECTION     TONSILLECTOMY      SOCIAL HISTORY: Social History   Socioeconomic History   Marital status: Married    Spouse name: Not on file   Number of children: Not on file   Years of education: Not on file   Highest education level: Not on file  Occupational History   Not on file  Tobacco Use   Smoking status: Never   Smokeless tobacco: Never  Vaping Use   Vaping Use: Never used  Substance and Sexual Activity   Alcohol use: No   Drug use: No   Sexual activity: Not on file  Other Topics Concern   Not on file  Social History Narrative   Not on file   Social Determinants of Health   Financial Resource Strain: Not on file  Food Insecurity: Not on file  Transportation Needs: Not on file  Physical Activity: Not on file  Stress: Not on file  Social Connections: Not on file  Intimate Partner Violence: Not on file    FAMILY HISTORY: Family History  Problem Relation Age of Onset   Urticaria Neg Hx    Allergic rhinitis Neg Hx     Angioedema Neg Hx    Asthma Neg Hx    Atopy Neg Hx    Eczema Neg Hx    Immunodeficiency Neg Hx     ALLERGIES:  has No Known Allergies.  MEDICATIONS:  Current Outpatient Medications  Medication Sig Dispense Refill   atorvastatin (LIPITOR) 10 MG tablet Take 10 mg by mouth daily.     calcium carbonate (OSCAL) 1500 (600 Ca) MG TABS tablet Take 600 mg of elemental calcium by mouth 2 (two) times daily with a meal.     cetirizine (ZYRTEC) 10 MG tablet Take 10 mg by mouth daily.     cholecalciferol (VITAMIN D3) 25 MCG (1000 UNIT) tablet Take 1,000 Units by mouth 2 (two) times daily.     famotidine (PEPCID) 40 MG tablet Take 40 mg by mouth daily.     letrozole (FEMARA) 2.5 MG tablet Take 1 tablet (2.5 mg total) by mouth daily. 90 tablet 3   levothyroxine (SYNTHROID) 25 MCG tablet Take 25 mcg by mouth daily.     venlafaxine XR (EFFEXOR-XR) 37.5 MG 24 hr capsule TAKE 1 CAPSULE BY MOUTH ONCE DAILY WITH BREAKFAST 30 capsule 0  No current facility-administered medications for this visit.    REVIEW OF SYSTEMS:   Review of Systems  Constitutional:  Positive for fatigue. Negative for appetite change.  HENT:   Positive for trouble swallowing.   Cardiovascular:  Positive for palpitations.  Gastrointestinal:  Positive for abdominal distention and abdominal pain (lower).  Endocrine: Positive for hot flashes.  Musculoskeletal:  Positive for arthralgias (6/10).  All other systems reviewed and are negative.  PHYSICAL EXAMINATION: ECOG PERFORMANCE STATUS: 0 - Asymptomatic  Vitals:   06/09/21 1340  BP: (!) 151/86  Pulse: 87  Resp: 18  Temp: 98.6 F (37 C)  SpO2: 96%   Filed Weights   06/09/21 1340  Weight: 184 lb (83.5 kg)   Physical Exam Vitals reviewed.  Constitutional:      Appearance: Normal appearance. She is obese.  Cardiovascular:     Rate and Rhythm: Normal rate and regular rhythm.     Pulses: Normal pulses.     Heart sounds: Normal heart sounds.  Pulmonary:     Effort:  Pulmonary effort is normal.     Breath sounds: Normal breath sounds.  Chest:  Breasts:    Right: Normal. No swelling, bleeding, inverted nipple, mass, nipple discharge, skin change or tenderness.     Left: Swelling (lymphedema in lower part of breast) present. No bleeding, inverted nipple, mass, nipple discharge, skin change (UOQ lumpectomy scar WNL) or tenderness.  Abdominal:     Palpations: Abdomen is soft. There is no hepatomegaly, splenomegaly or mass.     Tenderness: There is no abdominal tenderness.  Lymphadenopathy:     Upper Body:     Right upper body: No supraclavicular adenopathy.     Left upper body: No supraclavicular adenopathy.  Neurological:     General: No focal deficit present.     Mental Status: She is alert and oriented to person, place, and time.  Psychiatric:        Mood and Affect: Mood normal.        Behavior: Behavior normal.    Breast Exam Chaperone: Thana Ates    LABORATORY DATA:  I have reviewed the data as listed Recent Results (from the past 2160 hour(s))  Comprehensive metabolic panel     Status: None   Collection Time: 06/09/21  2:17 PM  Result Value Ref Range   Sodium 139 135 - 145 mmol/L   Potassium 3.9 3.5 - 5.1 mmol/L   Chloride 103 98 - 111 mmol/L   CO2 27 22 - 32 mmol/L   Glucose, Bld 99 70 - 99 mg/dL    Comment: Glucose reference range applies only to samples taken after fasting for at least 8 hours.   BUN 13 6 - 20 mg/dL   Creatinine, Ser 0.82 0.44 - 1.00 mg/dL   Calcium 9.7 8.9 - 10.3 mg/dL   Total Protein 7.8 6.5 - 8.1 g/dL   Albumin 4.2 3.5 - 5.0 g/dL   AST 21 15 - 41 U/L   ALT 23 0 - 44 U/L   Alkaline Phosphatase 41 38 - 126 U/L   Total Bilirubin 0.3 0.3 - 1.2 mg/dL   GFR, Estimated >60 >60 mL/min    Comment: (NOTE) Calculated using the CKD-EPI Creatinine Equation (2021)    Anion gap 9 5 - 15    Comment: Performed at [facility], [address] [place], [place] [address]  CBC with Differential     Status: Abnormal    Collection Time: 06/09/21  2:17 PM  Result Value  Ref Range   WBC 5.5 4.0 - 10.5 K/uL   RBC 3.78 (L) 3.87 - 5.11 MIL/uL   Hemoglobin 11.5 (L) 12.0 - 15.0 g/dL   HCT 34.5 (L) 36.0 - 46.0 %   MCV 91.3 80.0 - 100.0 fL   MCH 30.4 26.0 - 34.0 pg   MCHC 33.3 30.0 - 36.0 g/dL   RDW 12.7 11.5 - 15.5 %   Platelets 286 150 - 400 K/uL   nRBC 0.0 0.0 - 0.2 %   Neutrophils Relative % 59 %   Neutro Abs 3.2 1.7 - 7.7 K/uL   Lymphocytes Relative 30 %   Lymphs Abs 1.7 0.7 - 4.0 K/uL   Monocytes Relative 9 %   Monocytes Absolute 0.5 0.1 - 1.0 K/uL   Eosinophils Relative 2 %   Eosinophils Absolute 0.1 0.0 - 0.5 K/uL   Basophils Relative 0 %   Basophils Absolute 0.0 0.0 - 0.1 K/uL   Immature Granulocytes 0 %   Abs Immature Granulocytes 0.02 0.00 - 0.07 K/uL    Comment: Performed at [facility], [address], [place], [place] [address]    RADIOGRAPHIC STUDIES: I have personally reviewed the radiological reports and agreed with the findings in the report. No results found.   ASSESSMENT:  Stage I (PT1BPN0) left breast invasive lobular carcinoma: - Biopsy on 07/16/2020: Invasive mammary carcinoma, grade 1, ER/PR positive, HER2 negative. - Lumpectomy on 08/14/2020: 0.8 cm invasive lobular carcinoma, free margins, 0/4 lymph nodes involved, grade 1, ER 90%, PR 95%, HER2 negative, Ki-67 5%. 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 - Letrozole 2.5 mg daily started around 11/12/2020.   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  She is a non-smoker. - Maternal grandmother had breast cancer.  Maternal great aunt had breast cancer.   PLAN:  Stage I left breast invasive lobular carcinoma: - I have reviewed bilateral diagnostic mammogram from 04/08/2021 done at [facility].  No evidence of new or recurrent breast cancer.  Benign postlumpectomy changes on the left. - Physical examination today showed left breast lumpectomy  scar in the upper outer quadrant stable.  No palpable masses in bilateral breast.  Mild lymphedema in the left breast.  No palpable adenopathy. - She is tolerating letrozole reasonably well.  She has joint aches in the hips and shoulders occasionally. - She reported 3 to 4-week history of swelling in her abdomen mainly in the afternoons which improves overnight.  She also reported abnormal elevation of liver enzymes, [facility]  She also reported occasional lower abdominal pain.  No accompanying weight loss. - Would recommend abdominal imaging.  RTC after the scan.  2.  Bone health: - We will check vitamin D level and a baseline DEXA scan.  3.  Hot flashes: - Continue Effexor XR 37.5 mg daily.    Derek Jack, MD 06/09/21 4:38 PM  [place] [phone]   I, Thana Ates, am acting as a scribe for Dr. Derek Jack.  I, Derek Jack MD, have reviewed the above documentation for accuracy and completeness, and I agree with the above.

## 2021-06-09 NOTE — Patient Instructions (Addendum)
[place] at [facility] Discharge Instructions  You were seen and examined today by Dr. Delton Coombes. Dr. Delton Coombes is a medical oncologist, meaning that he specializes in the treatment of cancer diagnoses. Dr. Delton Coombes discussed your past medical history, family history of cancers, and the events that led to you being here today.  Dr. Delton Coombes discussed your previous diagnosis of breast cancer. Dr. Delton Coombes has recommended continuing Letrozole. You will need to be on this for at least 5 years.  Dr. Delton Coombes has recommended a bone density scan today. Long-term anti-estrogen therapy can impact the bones, this is done to get a baseline status of your bones currently. Dr. Delton Coombes has also recommended checking your Vitamin D level in addition to routine lab work, including your liver functions.   Due to your abdominal swelling, Dr. Delton Coombes discussed a CT of your abdomen and pelvis for further evaluation.  Follow-up with Dr. Delton Coombes as scheduled.   Thank you for choosing [place] at [facility] to provide your oncology and hematology care.  To afford each patient quality time with our provider, please arrive at least 15 minutes before your scheduled appointment time.   If you have a lab appointment with the [facility] please come in thru the Main Entrance and check in at the main information desk.  You need to re-schedule your appointment should you arrive 10 or more minutes late.  We strive to give you quality time with our providers, and arriving late affects you and other patients whose appointments are after yours.  Also, if you no show three or more times for appointments you may be dismissed from the clinic at the providers discretion.     Again, thank you for choosing [facility].  Our hope is that these requests will decrease the amount of time that you wait before being seen by our physicians.        _____________________________________________________________  Should you have questions after your visit to [facility], please contact our office at [phone] and follow the prompts.  Our office hours are 8:00 a.m. and 4:30 p.m. Monday - Friday.  Please note that voicemails left after 4:00 p.m. may not be returned until the following business day.  We are closed weekends and major holidays.  You do have access to a nurse 24-7, just call the main number to the clinic [phone] and do not press any options, hold on the line and a nurse will answer the phone.    For prescription refill requests, have your pharmacy contact our office and allow 72 hours.    Due to Covid, you will need to wear a mask upon entering the hospital. If you do not have a mask, a mask will be given to you at the Main Entrance upon arrival. For doctor visits, patients may have 1 support person age 53 or older with them. For treatment visits, patients can not have anyone with them due to social distancing guidelines and our immunocompromised population.

## 2021-06-17 ENCOUNTER — Ambulatory Visit (HOSPITAL_COMMUNITY)
Admission: RE | Admit: 2021-06-17 | Discharge: 2021-06-17 | Disposition: A | Discharge status: Home / Self Care | Payer: BC Managed Care – PPO | Source: Ambulatory Visit | Attending: Hematology | Admitting: Hematology

## 2021-06-17 ENCOUNTER — Other Ambulatory Visit: Payer: Self-pay

## 2021-06-17 DIAGNOSIS — Z170 Estrogen receptor positive status [ER+]: Secondary | ICD-10-CM | POA: Diagnosis present

## 2021-06-17 DIAGNOSIS — C50412 Malignant neoplasm of upper-outer quadrant of left female breast: Secondary | ICD-10-CM | POA: Insufficient documentation

## 2021-06-18 ENCOUNTER — Ambulatory Visit (HOSPITAL_BASED_OUTPATIENT_CLINIC_OR_DEPARTMENT_OTHER)
Admission: RE | Admit: 2021-06-18 | Discharge: 2021-06-18 | Disposition: A | Discharge status: Home / Self Care | Payer: BC Managed Care – PPO | Source: Ambulatory Visit | Attending: Hematology | Admitting: Hematology

## 2021-06-18 ENCOUNTER — Telehealth: Payer: Self-pay

## 2021-06-18 ENCOUNTER — Encounter (HOSPITAL_BASED_OUTPATIENT_CLINIC_OR_DEPARTMENT_OTHER): Payer: Self-pay

## 2021-06-18 DIAGNOSIS — Z170 Estrogen receptor positive status [ER+]: Secondary | ICD-10-CM | POA: Insufficient documentation

## 2021-06-18 DIAGNOSIS — C50412 Malignant neoplasm of upper-outer quadrant of left female breast: Secondary | ICD-10-CM | POA: Insufficient documentation

## 2021-06-18 MED ORDER — IOHEXOL 300 MG/ML  SOLN
100.0000 mL | Freq: Once | INTRAMUSCULAR | Status: AC | PRN
  Administered 2021-06-18: 16:00:00 85 mL via INTRAVENOUS

## 2021-06-18 NOTE — Telephone Encounter (Signed)
Attempted to call pt to give results per NP. Pt did not answer; LVM for return call. ?

## 2021-06-18 NOTE — Telephone Encounter (Signed)
-----   Message from Gardenia Phlegm, NP sent at 06/17/2021 11:12 PM EST ----- ?Please let patient know that bone density is normal ?----- Message ----- ?From: Interface, Rad Results In ?Sent: 06/17/2021   2:52 PM EST ?To: Gardenia Phlegm, NP ? ? ?

## 2021-06-22 ENCOUNTER — Telehealth: Payer: Self-pay

## 2021-06-22 ENCOUNTER — Ambulatory Visit: Payer: BC Managed Care – PPO | Attending: Adult Health | Admitting: Rehabilitation

## 2021-06-22 ENCOUNTER — Other Ambulatory Visit: Payer: Self-pay

## 2021-06-22 DIAGNOSIS — Z170 Estrogen receptor positive status [ER+]: Secondary | ICD-10-CM

## 2021-06-22 DIAGNOSIS — C50911 Malignant neoplasm of unspecified site of right female breast: Secondary | ICD-10-CM

## 2021-06-22 DIAGNOSIS — Z483 Aftercare following surgery for neoplasm: Secondary | ICD-10-CM

## 2021-06-22 NOTE — Therapy (Signed)
?  OUTPATIENT PHYSICAL THERAPY SOZO SCREENING NOTE ? ? ?Patient Name: Holly Allison ?MRN: [id number] ?DOB:[birth year], 56 y.o., female ?Today's Date: 06/22/2021 ? ?PCP: Manon Hilding, MD ?REFERRING PROVIDER: Deanne Coffer* ? ? PT End of Session - 06/22/21 1457   ? ? Visit Number 5   screen only  ? Number of Visits 9   ? Date for PT Re-Evaluation 04/22/21   ? PT Start Time 1500   ? PT Stop Time 1503   ? PT Time Calculation (min) 3 min   ? Activity Tolerance Patient tolerated treatment well   ? Behavior During Therapy [facility] for tasks assessed/performed   ? ?  ?  ? ?  ? ? ?Past Medical History:  ?Diagnosis Date  ? Angio-edema   ? Cancer [facility])   ? Family history of adverse reaction to anesthesia   ? Mother   ? Heart murmur   ? Since childhood  ? Pre-diabetes   ? Patient states she was told she is prediabetic at yearly physical two months ago. Patient states her A1 C was around 5.6  ? Urticaria   ? ?Past Surgical History:  ?Procedure Laterality Date  ? AXILLARY SENTINEL NODE BIOPSY Left 08/14/2020  ? Procedure: LEFT AXILLARY SENTINEL NODE BIOPSY;  Surgeon: Rolm Bookbinder, MD;  Location: [facility];  Service: General;  Laterality: Left;  ? BREAST LUMPECTOMY WITH RADIOACTIVE SEED AND SENTINEL LYMPH NODE BIOPSY Left 08/14/2020  ? Procedure: LEFT BREAST LUMPECTOMY WITH RADIOACTIVE SEED;  Surgeon: Rolm Bookbinder, MD;  Location: [facility];  Service: General;  Laterality: Left;  ? CESAREAN SECTION    ? TONSILLECTOMY    ? ?Patient Active Problem List  ? Diagnosis Date Noted  ? Genetic testing 08/08/2020  ? Malignant neoplasm of upper-outer quadrant of left breast in female, estrogen receptor positive ([place]) 08/05/2020  ? Allergic rhinitis 07/29/2015  ? Angioedema 07/29/2015  ? Acute urticaria 08/31/2014  ? Allergic reaction 08/30/2014  ? Urticaria, acute 08/30/2014  ? Urticaria 08/30/2014  ? GERD (gastroesophageal reflux disease) 10/21/2012  ? Other pancytopenia ([place]) 10/21/2012  ? Hypotension, unspecified 10/20/2012  ?  Dehydration 10/20/2012  ? Tick-borne disease 10/20/2012  ? Leukopenia 10/19/2012  ? Thrombocytopenia ([place]) 10/19/2012  ? Elevated transaminase level 10/19/2012  ? Tachycardia 10/19/2012  ? Hypokalemia 10/19/2012  ? ? ?REFERRING DIAG: Lt lymphedema risk ? ?THERAPY DIAG:  ?Malignant neoplasm of right breast in female, estrogen receptor positive, unspecified site of breast ([place]) ? ?Aftercare following surgery for neoplasm ? ?PERTINENT HISTORY:  ? Left breast cancer with scheduled lumpectomy with SLNB with radiation.  Cancer is ER positive.  No other health problems. Surgery performed on 08/14/2020, 0/4 LN's Invasive and in situLobular carcinoma.  She will have radiation in [place].   ? ? ?PRECAUTIONS: Rt UE lymphedema ? ?SUBJECTIVE: No c/o ? ?PAIN:  ?Are you having pain? No ? ?SOZO SCREENING: ?Patient was assessed today using the SOZO machine to determine the lymphedema index score. This was compared to her baseline score. It was determined that she is within the recommended range when compared to her baseline and no further action is needed at this time. She will return in 3 months for her next SOZO screen. ? ? ? ?Shan Levans, PT ? ?06/22/2021, 3:03 PM ? ?  ? ?

## 2021-06-22 NOTE — Telephone Encounter (Signed)
-----   Message from Gardenia Phlegm, NP sent at 06/17/2021 11:12 PM EST ----- ?Please let patient know that bone density is normal ?----- Message ----- ?From: Interface, Rad Results In ?Sent: 06/17/2021   2:52 PM EST ?To: Gardenia Phlegm, NP ? ? ?

## 2021-06-22 NOTE — Telephone Encounter (Signed)
Called and left VM regarding normal bone density results, per [facility], requested call back if question or concerns.   ?

## 2021-06-25 ENCOUNTER — Inpatient Hospital Stay (HOSPITAL_COMMUNITY): Payer: BC Managed Care – PPO | Attending: Hematology | Admitting: Hematology

## 2021-06-25 ENCOUNTER — Other Ambulatory Visit: Payer: Self-pay

## 2021-06-25 VITALS — BP 144/90 | HR 66 | Temp 98.6°F | Resp 18 | Ht 64.0 in | Wt 184.0 lb

## 2021-06-25 DIAGNOSIS — Z79811 Long term (current) use of aromatase inhibitors: Secondary | ICD-10-CM | POA: Insufficient documentation

## 2021-06-25 DIAGNOSIS — C50412 Malignant neoplasm of upper-outer quadrant of left female breast: Secondary | ICD-10-CM | POA: Insufficient documentation

## 2021-06-25 DIAGNOSIS — Z170 Estrogen receptor positive status [ER+]: Secondary | ICD-10-CM | POA: Insufficient documentation

## 2021-06-25 NOTE — Patient Instructions (Addendum)
[place] at [facility] ?Discharge Instructions ? ?You were seen and examined today by Dr. Delton Coombes.  ? ?Dr. Delton Coombes reviewed your recent CT scan, which revealed an increased stool burden. There was nothing concerning for cancer on the CT scan. It is possible that the medications you are on have increased your constipation. Dr. Delton Coombes discussed decreasing Effexor to see if it improves. You will need to wean yourself off of it. Take it every other day for a month prior to stopping. If you experience hot flashes, there are both over-the-counter and prescription options. You could try Black Cohosh over-the-counter first and call if hot flashes persist. You should also start taking a stool softener daily and use the Miralax as needed. ? ?Dr. Delton Coombes also reviewed your bone density, which was normal. This will be repeated in 3 years. ? ?Your liver function tests were also normal! ? ?Follow-up with Dr. Delton Coombes in 4 months with labs prior. ? ? ?Thank you for choosing [place] at [facility] to provide your oncology and hematology care.  To afford each patient quality time with our provider, please arrive at least 15 minutes before your scheduled appointment time.  ? ?If you have a lab appointment with the [place] please come in thru the Main Entrance and check in at the main information desk. ? ?You need to re-schedule your appointment should you arrive 10 or more minutes late.  We strive to give you quality time with our providers, and arriving late affects you and other patients whose appointments are after yours.  Also, if you no show three or more times for appointments you may be dismissed from the clinic at the providers discretion.     ?Again, thank you for choosing [facility].  Our hope is that these requests will decrease the amount of time that you wait before being seen by our physicians.        ?_____________________________________________________________ ? ?Should you have questions after your visit to [facility], please contact our office at [phone] and follow the prompts.  Our office hours are 8:00 a.m. and 4:30 p.m. Monday - Friday.  Please note that voicemails left after 4:00 p.m. may not be returned until the following business day.  We are closed weekends and major holidays.  You do have access to a nurse 24-7, just call the main number to the clinic [phone] and do not press any options, hold on the line and a nurse will answer the phone.   ? ?For prescription refill requests, have your pharmacy contact our office and allow 72 hours.   ? ?Due to Covid, you will need to wear a mask upon entering the hospital. If you do not have a mask, a mask will be given to you at the Main Entrance upon arrival. For doctor visits, patients may have 1 support person age 56 or older with them. For treatment visits, patients can not have anyone with them due to social distancing guidelines and our immunocompromised population.  ? ? ? ?

## 2021-06-25 NOTE — Progress Notes (Signed)
? ?[facility] ?[address] ?[place], [place] [address] ? ? ?Patient Care Team: ?Manon Hilding, MD as PCP - General (Cardiology) ?Rolly Pancake, MD as Referring Physician (Radiation Oncology) ?Nicholas Lose, MD as Consulting Physician (Hematology and Oncology) ?Rolm Bookbinder, MD as Consulting Physician (General Surgery) ?Derek Jack, MD as Medical Oncologist (Medical Oncology) ? ?SUMMARY OF ONCOLOGIC HISTORY: ?Oncology History  ?Malignant neoplasm of upper-outer quadrant of left breast in female, estrogen receptor positive (Holly Allison)  ?07/16/2020 Initial Diagnosis  ? Screening mammogram detected possible asymmetries in the bilateral breasts. 0.5cm mass at the 2:30 position in the left breast, with no left axillary adenopathy, and no focal abnormalities in the right breast. Biopsy showed invasive and in situ ductal carcinoma, grade 1, strong ER/PR positive, HER-2 negative, Ki67 <5% ?  ?08/05/2020 Cancer Staging  ? Staging form: Breast, AJCC 8th Edition ?- Clinical stage from 08/05/2020: Stage IA (cT1a, cN0, cM0, G1, ER+, PR+, HER2-) - Signed by Nicholas Lose, MD on 08/05/2020 ?Stage prefix: Initial diagnosis ?Histologic grading system: 3 grade system ? ?  ?08/07/2020 Genetic Testing  ? Negative genetic testing on the Common hereditary cancer panel.  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  The Common Hereditary Gene Panel offered by [facility] includes sequencing and/or deletion duplication testing of the following 47 genes: APC, ATM, AXIN2, BARD1, BMPR1A, BRCA1, BRCA2, BRIP1, CDH1, CDK4, CDKN2A (p14ARF), CDKN2A (p16INK4a), CHEK2, CTNNA1, DICER1, EPCAM (Deletion/duplication testing only), GREM1 (promoter region deletion/duplication testing only), KIT, MEN1, MLH1, MSH2, MSH3, MSH6, MUTYH, NBN, NF1, NHTL1, PALB2, PDGFRA, PMS2, POLD1, POLE, PTEN, RAD50, RAD51C, RAD51D, SDHB, SDHC, SDHD, SMAD4, SMARCA4. STK11, TP53, TSC1, TSC2, and VHL.  The following genes were evaluated for sequence changes  only: SDHA and HOXB13 c.251G>A variant only. The report date is August 07, 2020. ?  ?08/14/2020 Surgery  ? Left lumpectomy Holly Allison): invasive and in situ lobular carcinoma, 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  ?  ?08/14/2020 Cancer Staging  ? Staging form: Breast, AJCC 8th Edition ?- Pathologic stage from 08/14/2020: Stage IA (pT1b, pN0, cM0, G1, ER+, PR+, HER2-) - Signed by Gardenia Phlegm, NP on 03/02/2021 ?Stage prefix: Initial diagnosis ?Histologic grading system: 3 grade system ? ?  ?10/01/2020 - 10/29/2020 Radiation Therapy  ? Dr. Adella Nissen in [place]: Standard radiation therapy to the left breast. This will consist of a dose of 4272 cGy in 16 fractions followed by a boost to the tumor bed of an additional 1000 cGy in 4 fractions. Simulation and treatment treatment are scheduled to begin as soon as possible.  ?  ?11/2020 -  Anti-estrogen oral therapy  ? Letrozole daily ?  ? ? ?CHIEF COMPLIANT: Follow-up of left breast cancer ? ? ?INTERVAL HISTORY: Holly Allison is a 56 y.o. female here today for follow up of her left breast cancer. Her last visit was on 06/09/2021.  ? ?Today she reports feeling good. She reports continued abdominal swelling in the afternoons which started prior to Christmas of last year. She has taken Miralax in the mornings every day for several years. She reports 1 BM in the afternoon daily, and the abdominal swelling begins right before that time. She reports history of BM that have it difficult to pass a complete BM. She reports her hot flashes are well controlled with Effexor; when she had hot flashes, they were worst at night and disrupting her sleep. She does not currently take stool softener.  ? ?REVIEW OF SYSTEMS:   ?Review of Systems  ?  Constitutional:  Negative for appetite change and fatigue.  ?Cardiovascular:  Positive for palpitations.  ?Gastrointestinal:  Positive for abdominal distention and constipation.  ?Endocrine: Negative for hot  flashes (controlled).  ?All other systems reviewed and are negative. ? ?I have reviewed the past medical history, past surgical history, social history and family history with the patient and they are unchanged from previous note. ? ? ?ALLERGIES:   ?has No Known Allergies. ? ? ?MEDICATIONS:  ?Current Outpatient Medications  ?Medication Sig Dispense Refill  ? atorvastatin (LIPITOR) 10 MG tablet Take 10 mg by mouth daily.    ? calcium carbonate (OSCAL) 1500 (600 Ca) MG TABS tablet Take 600 mg of elemental calcium by mouth 2 (two) times daily with a meal.    ? cetirizine (ZYRTEC) 10 MG tablet Take 10 mg by mouth daily.    ? cholecalciferol (VITAMIN D3) 25 MCG (1000 UNIT) tablet Take 1,000 Units by mouth 2 (two) times daily.    ? famotidine (PEPCID) 40 MG tablet Take 40 mg by mouth daily.    ? letrozole (FEMARA) 2.5 MG tablet Take 1 tablet (2.5 mg total) by mouth daily. 90 tablet 3  ? levothyroxine (SYNTHROID) 25 MCG tablet Take 25 mcg by mouth daily.    ? venlafaxine XR (EFFEXOR-XR) 37.5 MG 24 hr capsule TAKE 1 CAPSULE BY MOUTH ONCE DAILY WITH BREAKFAST 30 capsule 0  ? ?No current facility-administered medications for this visit.  ? ? ? ?PHYSICAL EXAMINATION: ?Performance status (ECOG): 0 - Asymptomatic ? ?Vitals:  ? 06/25/21 1401  ?BP: (!) 144/90  ?Pulse: 66  ?Resp: 18  ?Temp: 98.6 ?F (37 ?C)  ?SpO2: 100%  ? ?Wt Readings from Last 3 Encounters:  ?06/25/21 184 lb (83.5 kg)  ?06/09/21 184 lb (83.5 kg)  ?03/02/21 181 lb 14.4 oz (82.5 kg)  ? ?Physical Exam ?Vitals reviewed.  ?Constitutional:   ?   Appearance: Normal appearance.  ?Cardiovascular:  ?   Rate and Rhythm: Normal rate and regular rhythm.  ?   Pulses: Normal pulses.  ?   Heart sounds: Normal heart sounds.  ?Pulmonary:  ?   Effort: Pulmonary effort is normal.  ?   Breath sounds: Normal breath sounds.  ?Neurological:  ?   General: No focal deficit present.  ?   Mental Status: She is alert and oriented to person, place, and time.  ?Psychiatric:     ?   Mood and  Affect: Mood normal.     ?   Behavior: Behavior normal.  ? ? ?Breast Exam Chaperone: Thana Ates   ? ? ?LABORATORY DATA:  ?I have reviewed the data as listed ?CMP Latest Ref Rng & Units 06/09/2021 08/06/2020 07/29/2015  ?Glucose 70 - 99 mg/dL 99 95 128(H)  ?BUN 6 - 20 mg/dL 13 10 10   ?Creatinine 0.44 - 1.00 mg/dL 0.82 0.79 0.83  ?Sodium 135 - 145 mmol/L 139 138 143  ?Potassium 3.5 - 5.1 mmol/L 3.9 3.8 3.9  ?Chloride 98 - 111 mmol/L 103 102 104  ?CO2 22 - 32 mmol/L 27 28 30   ?Calcium 8.9 - 10.3 mg/dL 9.7 9.3 9.4  ?Total Protein 6.5 - 8.1 g/dL 7.8 - 6.9  ?Total Bilirubin 0.3 - 1.2 mg/dL 0.3 - 0.5  ?Alkaline Phos 38 - 126 U/L 41 - 47  ?AST 15 - 41 U/L 21 - 15  ?ALT 0 - 44 U/L 23 - 14  ? ?No results found for: [id number] ?Lab Results  ?Component Value Date  ? WBC 5.5 06/09/2021  ? HGB  11.5 (L) 06/09/2021  ? HCT 34.5 (L) 06/09/2021  ? MCV 91.3 06/09/2021  ? PLT 286 06/09/2021  ? NEUTROABS 3.2 06/09/2021  ? ? ?ASSESSMENT:  ?Stage I (PT1BPN0) left breast invasive lobular carcinoma: ?- Biopsy on 07/16/2020: Invasive mammary carcinoma, grade 1, ER/PR positive, HER2 negative. ?- Lumpectomy on 08/14/2020: 0.8 cm invasive lobular carcinoma, free margins, 0/4 lymph nodes involved, grade 1, ER 90%, PR 95%, HER2 negative, Ki-67 5%. 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 ?- Letrozole 2.5 mg daily started around 11/12/2020. ? ?  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  She is a non-smoker. ?- Maternal grandmother had breast cancer.  Maternal great aunt had breast cancer. ? ? ?PLAN:  ?Stage I left breast invasive lobular carcinoma: ?- Bilateral diagnostic mammogram on 03/31/2021 was BI-RADS Category 2. ?- She has complained of 1 month history of swelling in her abdomen, mainly in the afternoons which improves in the mornings.  She takes MiraLAX in the mornings.  She has a bowel movement around 2:30-3 PM.  She reports swelling in her abdomen  before the bowel movement.  She also has problems with hemorrhoids. ?- I have recommended starting stool softener daily and MiraLAX as needed. ?- She reports that this abdominal swelling and bloating started after

## 2021-07-01 ENCOUNTER — Telehealth: Payer: Self-pay | Admitting: Hematology and Oncology

## 2021-07-01 NOTE — Telephone Encounter (Signed)
Called pt to schedule per 2/221 inbasket, pt let me know that she will be following up at [facility], [place] NOTIFIED, appt cancelled ?

## 2021-07-03 ENCOUNTER — Other Ambulatory Visit (HOSPITAL_COMMUNITY): Payer: Self-pay

## 2021-07-03 MED ORDER — CITALOPRAM HYDROBROMIDE 10 MG PO TABS: 20.0000 mg | ORAL_TABLET | Freq: Every day | ORAL | 0 refills | Status: DC

## 2021-07-03 NOTE — Telephone Encounter (Signed)
Patient reports increasing hot flashes since decreasing effexor dosage. Order received from Dr. Delton Coombes for Celexa '10mg'$  once daily for 7 days and titrate up to '20mg'$  daily. Prescription sent and patient made aware. ?

## 2021-07-15 ENCOUNTER — Encounter (HOSPITAL_COMMUNITY): Payer: Self-pay

## 2021-09-02 ENCOUNTER — Ambulatory Visit: Payer: BC Managed Care – PPO | Admitting: Hematology and Oncology

## 2021-09-28 DIAGNOSIS — M7711 Lateral epicondylitis, right elbow: Secondary | ICD-10-CM | POA: Diagnosis not present

## 2021-09-28 DIAGNOSIS — Z6829 Body mass index (BMI) 29.0-29.9, adult: Secondary | ICD-10-CM | POA: Diagnosis not present

## 2021-10-01 DIAGNOSIS — I890 Lymphedema, not elsewhere classified: Secondary | ICD-10-CM | POA: Diagnosis not present

## 2021-10-05 ENCOUNTER — Ambulatory Visit: Payer: BC Managed Care – PPO | Attending: Adult Health

## 2021-10-05 DIAGNOSIS — C50911 Malignant neoplasm of unspecified site of right female breast: Secondary | ICD-10-CM | POA: Insufficient documentation

## 2021-10-05 DIAGNOSIS — Z170 Estrogen receptor positive status [ER+]: Secondary | ICD-10-CM | POA: Insufficient documentation

## 2021-10-05 DIAGNOSIS — Z483 Aftercare following surgery for neoplasm: Secondary | ICD-10-CM | POA: Insufficient documentation

## 2021-10-26 ENCOUNTER — Ambulatory Visit (HOSPITAL_COMMUNITY): Payer: BC Managed Care – PPO | Admitting: Hematology

## 2021-10-28 DIAGNOSIS — E782 Mixed hyperlipidemia: Secondary | ICD-10-CM | POA: Diagnosis not present

## 2021-10-28 DIAGNOSIS — R739 Hyperglycemia, unspecified: Secondary | ICD-10-CM | POA: Diagnosis not present

## 2021-10-28 DIAGNOSIS — R946 Abnormal results of thyroid function studies: Secondary | ICD-10-CM | POA: Diagnosis not present

## 2021-10-28 DIAGNOSIS — E559 Vitamin D deficiency, unspecified: Secondary | ICD-10-CM | POA: Diagnosis not present

## 2021-10-29 ENCOUNTER — Inpatient Hospital Stay (HOSPITAL_COMMUNITY): Payer: BC Managed Care – PPO | Attending: Hematology | Admitting: Hematology

## 2021-10-29 VITALS — BP 158/87 | HR 73 | Temp 98.6°F | Resp 18 | Ht 64.0 in | Wt 182.1 lb

## 2021-10-29 DIAGNOSIS — Z170 Estrogen receptor positive status [ER+]: Secondary | ICD-10-CM

## 2021-10-29 DIAGNOSIS — Z79811 Long term (current) use of aromatase inhibitors: Secondary | ICD-10-CM | POA: Insufficient documentation

## 2021-10-29 DIAGNOSIS — C50412 Malignant neoplasm of upper-outer quadrant of left female breast: Secondary | ICD-10-CM

## 2021-10-29 DIAGNOSIS — Z79899 Other long term (current) drug therapy: Secondary | ICD-10-CM | POA: Insufficient documentation

## 2021-10-29 NOTE — Patient Instructions (Addendum)
[place] at [facility] Discharge Instructions  You were seen and examined today by Dr. Delton Coombes.  Dr. Delton Coombes discussed your most recent lab work today. There are no concerns at this time.  Letrozole, and drugs like it, are known to increase cholesterol and can increase glucose in some patients. Since you have decided against taking any anti-estrogen therapy, we will need to be diligent in ensuring you have regular mammograms. Your next mammogram is due in December of this year. If we need to fax an order to the [facility], please let us know.  Continue your Calcium and Vitamin D.  Dr. Delton Coombes will see you again in December following your mammogram. At that time, you will need a CBC and CMP prior to seeing him.  Thank you for choosing [place] at [facility] to provide your oncology and hematology care.  To afford each patient quality time with our provider, please arrive at least 15 minutes before your scheduled appointment time.   If you have a lab appointment with the [facility] please come in thru the Main Entrance and check in at the main information desk.  You need to re-schedule your appointment should you arrive 10 or more minutes late.  We strive to give you quality time with our providers, and arriving late affects you and other patients whose appointments are after yours.  Also, if you no show three or more times for appointments you may be dismissed from the clinic at the providers discretion.     Again, thank you for choosing [facility].  Our hope is that these requests will decrease the amount of time that you wait before being seen by our physicians.       _____________________________________________________________  Should you have questions after your visit to [facility], please contact our office at [phone] and follow the prompts.  Our office hours are 8:00 a.m. and 4:30  p.m. Monday - Friday.  Please note that voicemails left after 4:00 p.m. may not be returned until the following business day.  We are closed weekends and major holidays.  You do have access to a nurse 24-7, just call the main number to the clinic [phone] and do not press any options, hold on the line and a nurse will answer the phone.    For prescription refill requests, have your pharmacy contact our office and allow 72 hours.

## 2021-10-29 NOTE — Progress Notes (Signed)
[facility] [address], [place] [address]   Patient Care Team: Manon Hilding, MD as PCP - General (Cardiology) Adella Nissen Phil Dopp, MD as Referring Physician (Radiation Oncology) Nicholas Lose, MD as Consulting Physician (Hematology and Oncology) Rolm Bookbinder, MD as Consulting Physician (General Surgery) Derek Jack, MD as Medical Oncologist (Medical Oncology)  SUMMARY OF ONCOLOGIC HISTORY: Oncology History  Malignant neoplasm of upper-outer quadrant of left breast in female, estrogen receptor positive ([place])  07/16/2020 Initial Diagnosis   Screening mammogram detected possible asymmetries in the bilateral breasts. 0.5cm mass at the 2:30 position in the left breast, with no left axillary adenopathy, and no focal abnormalities in the right breast. Biopsy showed invasive and in situ ductal carcinoma, grade 1, strong ER/PR positive, HER-2 negative, Ki67 <5%   08/05/2020 Cancer Staging   Staging form: Breast, AJCC 8th Edition - Clinical stage from 08/05/2020: Stage IA (cT1a, cN0, cM0, G1, ER+, PR+, HER2-) - Signed by Nicholas Lose, MD on 08/05/2020 Stage prefix: Initial diagnosis Histologic grading system: 3 grade system   08/07/2020 Genetic Testing   Negative genetic testing on the Common hereditary cancer panel.  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  The Common Hereditary Gene Panel offered by [facility] includes sequencing and/or deletion duplication testing of the following 47 genes: APC, ATM, AXIN2, BARD1, BMPR1A, BRCA1, BRCA2, BRIP1, CDH1, CDK4, CDKN2A (p14ARF), CDKN2A (p16INK4a), CHEK2, CTNNA1, DICER1, EPCAM (Deletion/duplication testing only), GREM1 (promoter region deletion/duplication testing only), KIT, MEN1, MLH1, MSH2, MSH3, MSH6, MUTYH, NBN, NF1, NHTL1, PALB2, PDGFRA, PMS2, POLD1, POLE, PTEN, RAD50, RAD51C, RAD51D, SDHB, SDHC, SDHD, SMAD4, SMARCA4. STK11, TP53, TSC1, TSC2, and VHL.  The following genes were evaluated for sequence changes  only: SDHA and HOXB13 c.251G>A variant only. The report date is August 07, 2020.   08/14/2020 Surgery   Left lumpectomy [name]): invasive and in situ lobular carcinoma, 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    08/14/2020 Cancer Staging   Staging form: Breast, AJCC 8th Edition - Pathologic stage from 08/14/2020: Stage IA (pT1b, pN0, cM0, G1, ER+, PR+, HER2-) - Signed by Gardenia Phlegm, NP on 03/02/2021 Stage prefix: Initial diagnosis Histologic grading system: 3 grade system   10/01/2020 - 10/29/2020 Radiation Therapy   Dr. Adella Nissen in [place]: Standard radiation therapy to the left breast. This will consist of a dose of 4272 cGy in 16 fractions followed by a boost to the tumor bed of an additional 1000 cGy in 4 fractions. Simulation and treatment treatment are scheduled to begin as soon as possible.    11/2020 -  Anti-estrogen oral therapy   Letrozole daily     CHIEF COMPLIANT: Follow-up of left breast cancer   INTERVAL HISTORY: Holly Allison is a 56 y.o. female here today for follow up of her left breast cancer. Her last visit was on 06/25/2021.   Today she reports feeling well. She reports she had pain in her elbow, hips, and shoulders which were disrupting her sleep as well as fatigue following starting letrozole, so she stopped letrozole 1 month ago. Her energy improved and her joint pains resolved following stopping the medication. She expresses she is not interested in trying any other antiestrogen medications at this time.   REVIEW OF SYSTEMS:   Review of Systems  Constitutional:  Positive for fatigue (improved). Negative for appetite change.  Musculoskeletal:  Arthralgias: elbows, hips, and shoulders - resolved.  Psychiatric/Behavioral:  Positive for sleep disturbance.   All other systems reviewed and  are negative.   I have reviewed the past medical history, past surgical history, social history and family history with the patient  and they are unchanged from previous note.   ALLERGIES:   has No Known Allergies.   MEDICATIONS:  Current Outpatient Medications  Medication Sig Dispense Refill   atorvastatin (LIPITOR) 10 MG tablet Take 10 mg by mouth daily.     calcium carbonate (OSCAL) 1500 (600 Ca) MG TABS tablet Take 600 mg of elemental calcium by mouth 2 (two) times daily with a meal.     cetirizine (ZYRTEC) 10 MG tablet Take 10 mg by mouth daily.     cholecalciferol (VITAMIN D3) 25 MCG (1000 UNIT) tablet Take 1,000 Units by mouth 2 (two) times daily.     citalopram (CELEXA) 10 MG tablet Take 2 tablets (20 mg total) by mouth daily. 60 tablet 0   famotidine (PEPCID) 40 MG tablet Take 40 mg by mouth daily.     letrozole (FEMARA) 2.5 MG tablet Take 1 tablet (2.5 mg total) by mouth daily. 90 tablet 3   levothyroxine (SYNTHROID) 25 MCG tablet Take 25 mcg by mouth daily.     venlafaxine XR (EFFEXOR-XR) 37.5 MG 24 hr capsule TAKE 1 CAPSULE BY MOUTH ONCE DAILY WITH BREAKFAST 30 capsule 0   No current facility-administered medications for this visit.     PHYSICAL EXAMINATION: Performance status (ECOG): 0 - Asymptomatic  There were no vitals filed for this visit. Wt Readings from Last 3 Encounters:  06/25/21 184 lb (83.5 kg)  06/09/21 184 lb (83.5 kg)  03/02/21 181 lb 14.4 oz (82.5 kg)   Physical Exam Vitals reviewed.  Constitutional:      Appearance: Normal appearance. She is obese.  Cardiovascular:     Rate and Rhythm: Normal rate and regular rhythm.     Pulses: Normal pulses.     Heart sounds: Normal heart sounds.  Pulmonary:     Effort: Pulmonary effort is normal.     Breath sounds: Normal breath sounds.  Neurological:     General: No focal deficit present.     Mental Status: She is alert and oriented to person, place, and time.  Psychiatric:        Mood and Affect: Mood normal.        Behavior: Behavior normal.     Breast Exam Chaperone: Holly Allison     LABORATORY DATA:  I have reviewed the  data as listed    Latest Ref Rng & Units 06/09/2021    2:17 PM 08/06/2020    3:15 PM 07/29/2015    1:48 PM  CMP  Glucose 70 - 99 mg/dL 99  95  128   BUN 6 - 20 mg/dL _0 Creatinine 0.44 - 1.00 mg/dL 0.82  0.79  0.83   Sodium 135 - 145 mmol/L 139  138  143   Potassium 3.5 - 5.1 mmol/L 3.9  3.8  3.9   Chloride 98 - 111 mmol/L 103  102  104   CO2 22 - 32 mmol/L _1 Calcium 8.9 - 10.3 mg/dL 9.7  9.3  9.4   Total Protein 6.5 - 8.1 g/dL 7.8   6.9   Total Bilirubin 0.3 - 1.2 mg/dL 0.3   0.5   Alkaline Phos 38 - 126 U/L 41   47   AST 15 - 41 U/L 21   15   ALT 0 - 44 U/L 23  14    No results found for: "CAN153" Lab Results  Component Value Date   WBC 5.5 06/09/2021   HGB 11.5 (L) 06/09/2021   HCT 34.5 (L) 06/09/2021   MCV 91.3 06/09/2021   PLT 286 06/09/2021   NEUTROABS 3.2 06/09/2021    ASSESSMENT:  Stage I (PT1BPN0) left breast invasive lobular carcinoma: - Biopsy on 07/16/2020: Invasive mammary carcinoma, grade 1, ER/PR positive, HER2 negative. - Lumpectomy on 08/14/2020: 0.8 cm invasive lobular carcinoma, free margins, 0/4 lymph nodes involved, grade 1, ER 90%, PR 95%, HER2 negative, Ki-67 5%. 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 - Letrozole 2.5 mg daily started around 11/12/2020.  Self discontinued on 10/01/2021 due to arthralgias, severe fatigue, increase in cholesterol and blood sugar.    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 - Maternal grandmother had breast cancer.  Maternal great aunt had breast cancer.   PLAN:  Stage I left breast invasive lobular carcinoma: - Bilateral diagnostic mammogram on 03/31/2021 at [facility] was BI-RADS Category 2. - She had developed pains in her hips, shoulders and elbows while taking letrozole.  She also felt fatigued and severely sluggish.  She stopped taking letrozole 4 weeks ago (10/01/2021).  After stopping  letrozole, she reported improvement in energy and joint pains. - She reported that her total cholesterol has increased to 263 and LDL to 181, triglycerides to 153.  She had a cholesterol drawn on 10/28/2021 which showed total cholesterol 254, LDL 176, triglycerides 94, 4 weeks after stopping letrozole. - I have talked to her about switching to tamoxifen.  She does not want to continue any antiestrogen therapy. - We will arrange for mammograms in December in [place].  RTC 6 months for follow-up with repeat CBC, CMP.  2.  Bone health: - Vitamin D on 10/29/2019 was 56.9.  Continue calcium and vitamin D supplements.  3.  Hot flashes: - She has been off of Femara.  Breast Cancer therapy associated bone loss: I have recommended calcium, Vitamin D and weight bearing exercises.  Orders placed this encounter:  No orders of the defined types were placed in this encounter.   The patient has a good understanding of the overall plan. She agrees with it. She will call with any problems that may develop before the next visit here.  Derek Jack, MD [place] [phone]   I, Holly Allison, am acting as a scribe for Dr. Derek Jack.  I, Derek Jack MD, have reviewed the above documentation for accuracy and completeness, and I agree with the above.

## 2021-10-31 DIAGNOSIS — I890 Lymphedema, not elsewhere classified: Secondary | ICD-10-CM | POA: Diagnosis not present

## 2021-12-01 DIAGNOSIS — I890 Lymphedema, not elsewhere classified: Secondary | ICD-10-CM | POA: Diagnosis not present

## 2021-12-03 DIAGNOSIS — C50412 Malignant neoplasm of upper-outer quadrant of left female breast: Secondary | ICD-10-CM | POA: Diagnosis not present

## 2021-12-03 DIAGNOSIS — C50912 Malignant neoplasm of unspecified site of left female breast: Secondary | ICD-10-CM | POA: Diagnosis not present

## 2021-12-03 DIAGNOSIS — Z6831 Body mass index (BMI) 31.0-31.9, adult: Secondary | ICD-10-CM | POA: Diagnosis not present

## 2021-12-03 DIAGNOSIS — Z170 Estrogen receptor positive status [ER+]: Secondary | ICD-10-CM | POA: Diagnosis not present

## 2021-12-30 IMAGING — MG MM BREAST SURGICAL SPECIMEN
1 series · 1 of 1 positions shown · non-contrast
Comparison: Previous exam(s).

CLINICAL DATA: Status post left lumpectomy

EXAM:
SPECIMEN RADIOGRAPH OF THE left BREAST

[L]
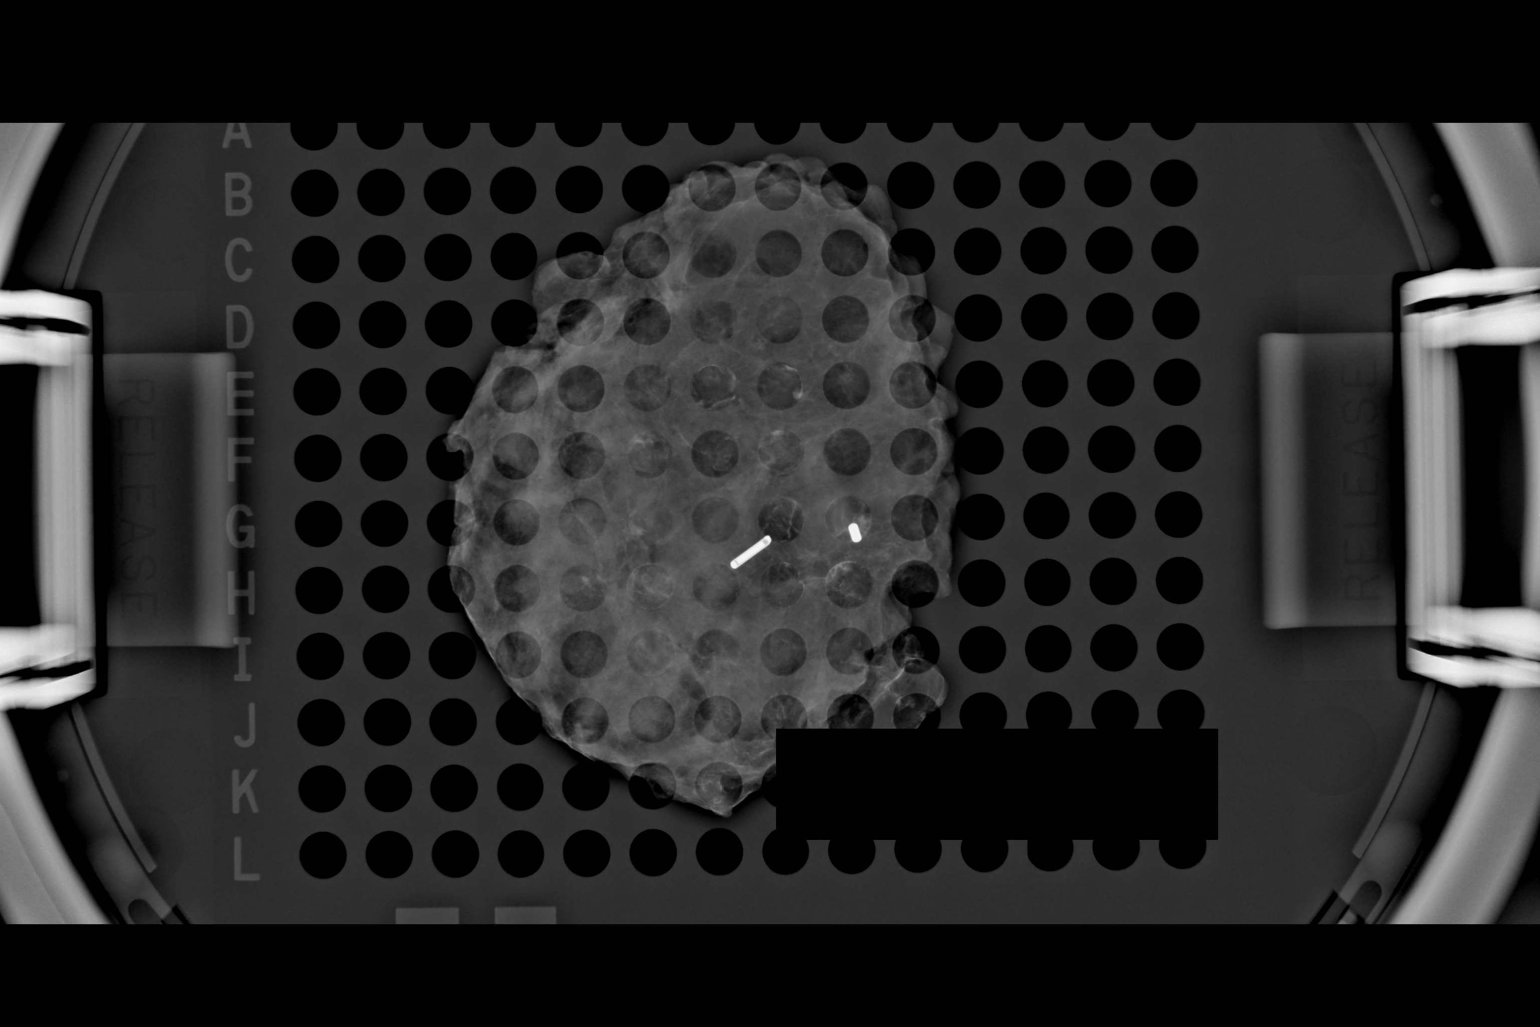

[1 of 1 positions shown; findings below may reference images not displayed]

FINDINGS: Status post excision of the left breast. The radioactive seed and
biopsy marker clip are present, completely intact, and were marked
for pathology.
IMPRESSION: Specimen radiograph of the left breast.

## 2022-03-27 DIAGNOSIS — Z6829 Body mass index (BMI) 29.0-29.9, adult: Secondary | ICD-10-CM | POA: Diagnosis not present

## 2022-03-27 DIAGNOSIS — J111 Influenza due to unidentified influenza virus with other respiratory manifestations: Secondary | ICD-10-CM | POA: Diagnosis not present

## 2022-03-27 DIAGNOSIS — Z20828 Contact with and (suspected) exposure to other viral communicable diseases: Secondary | ICD-10-CM | POA: Diagnosis not present

## 2022-04-02 DIAGNOSIS — J209 Acute bronchitis, unspecified: Secondary | ICD-10-CM | POA: Diagnosis not present

## 2022-04-13 ENCOUNTER — Inpatient Hospital Stay: Payer: Self-pay | Admitting: Hematology

## 2022-04-14 DIAGNOSIS — Z853 Personal history of malignant neoplasm of breast: Secondary | ICD-10-CM | POA: Diagnosis not present

## 2022-04-14 DIAGNOSIS — Z9889 Other specified postprocedural states: Secondary | ICD-10-CM | POA: Diagnosis not present

## 2022-05-03 DIAGNOSIS — E782 Mixed hyperlipidemia: Secondary | ICD-10-CM | POA: Diagnosis not present

## 2022-05-03 DIAGNOSIS — R739 Hyperglycemia, unspecified: Secondary | ICD-10-CM | POA: Diagnosis not present

## 2022-05-03 DIAGNOSIS — E039 Hypothyroidism, unspecified: Secondary | ICD-10-CM | POA: Diagnosis not present

## 2022-05-03 DIAGNOSIS — E559 Vitamin D deficiency, unspecified: Secondary | ICD-10-CM | POA: Diagnosis not present

## 2022-05-04 ENCOUNTER — Inpatient Hospital Stay: Payer: Self-pay | Admitting: Hematology

## 2022-05-05 ENCOUNTER — Inpatient Hospital Stay: Payer: BC Managed Care – PPO | Attending: Hematology | Admitting: Hematology

## 2022-05-05 VITALS — BP 137/87 | HR 79 | Temp 98.4°F | Resp 16 | Ht 64.0 in | Wt 193.6 lb

## 2022-05-05 DIAGNOSIS — Z79811 Long term (current) use of aromatase inhibitors: Secondary | ICD-10-CM | POA: Diagnosis not present

## 2022-05-05 DIAGNOSIS — C50412 Malignant neoplasm of upper-outer quadrant of left female breast: Secondary | ICD-10-CM | POA: Diagnosis not present

## 2022-05-05 DIAGNOSIS — Z170 Estrogen receptor positive status [ER+]: Secondary | ICD-10-CM

## 2022-05-05 NOTE — Progress Notes (Signed)
[facility] [address], [place] [address]   Patient Care Team: Manon Hilding, MD as PCP - General (Cardiology) Adella Nissen Phil Dopp, MD as Referring Physician (Radiation Oncology) Rolm Bookbinder, MD as Consulting Physician (General Surgery) Derek Jack, MD as Medical Oncologist (Medical Oncology)  SUMMARY OF ONCOLOGIC HISTORY: Oncology History  Malignant neoplasm of upper-outer quadrant of left breast in female, estrogen receptor positive ([place])  07/16/2020 Initial Diagnosis   Screening mammogram detected possible asymmetries in the bilateral breasts. 0.5cm mass at the 2:30 position in the left breast, with no left axillary adenopathy, and no focal abnormalities in the right breast. Biopsy showed invasive and in situ ductal carcinoma, grade 1, strong ER/PR positive, HER-2 negative, Ki67 <5%   08/05/2020 Cancer Staging   Staging form: Breast, AJCC 8th Edition - Clinical stage from 08/05/2020: Stage IA (cT1a, cN0, cM0, G1, ER+, PR+, HER2-) - Signed by Nicholas Lose, MD on 08/05/2020 Stage prefix: Initial diagnosis Histologic grading system: 3 grade system   08/07/2020 Genetic Testing   Negative genetic testing on the Common hereditary cancer panel.  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  The Common Hereditary Gene Panel offered by [facility] includes sequencing and/or deletion duplication testing of the following 47 genes: APC, ATM, AXIN2, BARD1, BMPR1A, BRCA1, BRCA2, BRIP1, CDH1, CDK4, CDKN2A (p14ARF), CDKN2A (p16INK4a), CHEK2, CTNNA1, DICER1, EPCAM (Deletion/duplication testing only), GREM1 (promoter region deletion/duplication testing only), KIT, MEN1, MLH1, MSH2, MSH3, MSH6, MUTYH, NBN, NF1, NHTL1, PALB2, PDGFRA, PMS2, POLD1, POLE, PTEN, RAD50, RAD51C, RAD51D, SDHB, SDHC, SDHD, SMAD4, SMARCA4. STK11, TP53, TSC1, TSC2, and VHL.  The following genes were evaluated for sequence changes only: SDHA and HOXB13 c.251G>A variant only. The report date is August 07, 2020.   08/14/2020 Surgery   Left lumpectomy [name]): invasive and in situ lobular carcinoma, 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    08/14/2020 Cancer Staging   Staging form: Breast, AJCC 8th Edition - Pathologic stage from 08/14/2020: Stage IA (pT1b, pN0, cM0, G1, ER+, PR+, HER2-) - Signed by Gardenia Phlegm, NP on 03/02/2021 Stage prefix: Initial diagnosis Histologic grading system: 3 grade system   10/01/2020 - 10/29/2020 Radiation Therapy   Dr. Adella Nissen in [place]: Standard radiation therapy to the left breast. This will consist of a dose of 4272 cGy in 16 fractions followed by a boost to the tumor bed of an additional 1000 cGy in 4 fractions. Simulation and treatment treatment are scheduled to begin as soon as possible.    11/2020 -  Anti-estrogen oral therapy   Letrozole daily     CHIEF COMPLIANT: Follow-up of left breast cancer   INTERVAL HISTORY: Holly Allison is a 57 y.o. female seen for follow-up of left breast cancer.  Denies any hot flashes.  She has some soreness under the left breast from lymphedema.  She has not been using breast pump on a regular basis.  Energy levels are 80%.  No new onset pains noted.  REVIEW OF SYSTEMS:   Review of Systems  Constitutional:  Negative for appetite change.  All other systems reviewed and are negative.   I have reviewed the past medical history, past surgical history, social history and family history with the patient and they are unchanged from previous note.   ALLERGIES:   has No Known Allergies.   MEDICATIONS:  Current Outpatient Medications  Medication Sig Dispense Refill   atorvastatin (LIPITOR) 10 MG tablet Take 10 mg by mouth daily.  calcium carbonate (OSCAL) 1500 (600 Ca) MG TABS tablet Take 600 mg of elemental calcium by mouth 2 (two) times daily with a meal.     cetirizine (ZYRTEC) 10 MG tablet Take 10 mg by mouth daily.     cholecalciferol (VITAMIN D3) 25 MCG (1000  UNIT) tablet Take 1,000 Units by mouth 2 (two) times daily.     citalopram (CELEXA) 10 MG tablet Take 2 tablets (20 mg total) by mouth daily. 60 tablet 0   famotidine (PEPCID) 40 MG tablet Take 40 mg by mouth daily.     letrozole (FEMARA) 2.5 MG tablet Take 1 tablet (2.5 mg total) by mouth daily. 90 tablet 3   levothyroxine (SYNTHROID) 25 MCG tablet Take 25 mcg by mouth daily.     triamcinolone cream (KENALOG) 0.1 % SMARTSIG:1 Application Topical 2-3 Times Daily     venlafaxine XR (EFFEXOR-XR) 37.5 MG 24 hr capsule TAKE 1 CAPSULE BY MOUTH ONCE DAILY WITH BREAKFAST 30 capsule 0   No current facility-administered medications for this visit.     PHYSICAL EXAMINATION: Performance status (ECOG): 0 - Asymptomatic  Vitals:   05/05/22 1535  BP: 137/87  Pulse: 79  Resp: 16  Temp: 98.4 F (36.9 C)  SpO2: 97%   Wt Readings from Last 3 Encounters:  05/05/22 193 lb 9.6 oz (87.8 kg)  10/29/21 182 lb 1.6 oz (82.6 kg)  06/25/21 184 lb (83.5 kg)   Physical Exam Vitals reviewed.  Constitutional:      Appearance: Normal appearance. She is obese.  Cardiovascular:     Rate and Rhythm: Normal rate and regular rhythm.     Pulses: Normal pulses.     Heart sounds: Normal heart sounds.  Pulmonary:     Effort: Pulmonary effort is normal.     Breath sounds: Normal breath sounds.  Neurological:     General: No focal deficit present.     Mental Status: She is alert and oriented to person, place, and time.  Psychiatric:        Mood and Affect: Mood normal.        Behavior: Behavior normal.       LABORATORY DATA:  I have reviewed the data as listed    Latest Ref Rng & Units 06/09/2021    2:17 PM 08/06/2020    3:15 PM 07/29/2015    1:48 PM  CMP  Glucose 70 - 99 mg/dL 99  95  128   BUN 6 - 20 mg/dL '13  10  10   '$ Creatinine 0.44 - 1.00 mg/dL 0.82  0.79  0.83   Sodium 135 - 145 mmol/L 139  138  143   Potassium 3.5 - 5.1 mmol/L 3.9  3.8  3.9   Chloride 98 - 111 mmol/L 103  102  104   CO2 22 -  32 mmol/L '27  28  30   '$ Calcium 8.9 - 10.3 mg/dL 9.7  9.3  9.4   Total Protein 6.5 - 8.1 g/dL 7.8   6.9   Total Bilirubin 0.3 - 1.2 mg/dL 0.3   0.5   Alkaline Phos 38 - 126 U/L 41   47   AST 15 - 41 U/L 21   15   ALT 0 - 44 U/L 23   14    No results found for: "CAN153" Lab Results  Component Value Date   WBC 5.5 06/09/2021   HGB 11.5 (L) 06/09/2021   HCT 34.5 (L) 06/09/2021   MCV 91.3 06/09/2021   PLT  286 06/09/2021   NEUTROABS 3.2 06/09/2021    ASSESSMENT:  Stage I (PT1BPN0) left breast invasive lobular carcinoma: - Biopsy on 07/16/2020: Invasive mammary carcinoma, grade 1, ER/PR positive, HER2 negative. - Lumpectomy on 08/14/2020: 0.8 cm invasive lobular carcinoma, free margins, 0/4 lymph nodes involved, grade 1, ER 90%, PR 95%, HER2 negative, Ki-67 5%. 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 - Letrozole 2.5 mg daily started around 11/12/2020.  Self discontinued on 10/01/2021 due to arthralgias, severe fatigue, increase in cholesterol and blood sugar.  She was not interested in tamoxifen.    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  She is a non-smoker. - Maternal grandmother had breast cancer.  Maternal great aunt had breast cancer.   PLAN:  Stage I left breast invasive lobular carcinoma: -I have reviewed mammogram from 04/14/2022, BI-RADS Category 2. - No palpable lymphadenopathy in the neck or axillary regions. - Reviewed labs from [place] which were within normal limits. - Recommend follow-up in 6 months with repeat labs.  2.  Bone health: - Vitamin D level is normal.  Continue calcium and vitamin D supplements.   Breast Cancer therapy associated bone loss: I have recommended calcium, Vitamin D and weight bearing exercises.  Orders placed this encounter:  No orders of the defined types were placed in this encounter.   The patient has a good understanding of the overall plan. She  agrees with it. She will call with any problems that may develop before the next visit here.  Derek Jack, MD [facility] [phone]

## 2022-05-05 NOTE — Patient Instructions (Signed)
[place] at [facility] Discharge Instructions   You were seen and examined today by Dr. Delton Coombes.  He reviewed the results of your lab work which are normal/stable.   He reviewed the results of your mammogram which is normal.   We will see you back in 6 months. We will repeat lab work prior to this appointment.    Thank you for choosing [place] at [facility] to provide your oncology and hematology care.  To afford each patient quality time with our provider, please arrive at least 15 minutes before your scheduled appointment time.   If you have a lab appointment with the [facility] please come in thru the Main Entrance and check in at the main information desk.  You need to re-schedule your appointment should you arrive 10 or more minutes late.  We strive to give you quality time with our providers, and arriving late affects you and other patients whose appointments are after yours.  Also, if you no show three or more times for appointments you may be dismissed from the clinic at the providers discretion.     Again, thank you for choosing [facility].  Our hope is that these requests will decrease the amount of time that you wait before being seen by our physicians.       _____________________________________________________________  Should you have questions after your visit to [facility], please contact our office at [phone] and follow the prompts.  Our office hours are 8:00 a.m. and 4:30 p.m. Monday - Friday.  Please note that voicemails left after 4:00 p.m. may not be returned until the following business day.  We are closed weekends and major holidays.  You do have access to a nurse 24-7, just call the main number to the clinic [phone] and do not press any options, hold on the line and a nurse will answer the phone.    For prescription refill requests, have your pharmacy contact our office and  allow 72 hours.    Due to Covid, you will need to wear a mask upon entering the hospital. If you do not have a mask, a mask will be given to you at the Main Entrance upon arrival. For doctor visits, patients may have 1 support person age 27 or older with them. For treatment visits, patients can not have anyone with them due to social distancing guidelines and our immunocompromised population.

## 2022-05-13 DIAGNOSIS — R300 Dysuria: Secondary | ICD-10-CM | POA: Diagnosis not present

## 2022-05-26 DIAGNOSIS — R102 Pelvic and perineal pain: Secondary | ICD-10-CM | POA: Diagnosis not present

## 2022-05-26 DIAGNOSIS — R319 Hematuria, unspecified: Secondary | ICD-10-CM | POA: Diagnosis not present

## 2022-05-26 DIAGNOSIS — N898 Other specified noninflammatory disorders of vagina: Secondary | ICD-10-CM | POA: Diagnosis not present

## 2022-05-26 DIAGNOSIS — R3915 Urgency of urination: Secondary | ICD-10-CM | POA: Diagnosis not present

## 2022-05-27 DIAGNOSIS — N950 Postmenopausal bleeding: Secondary | ICD-10-CM | POA: Diagnosis not present

## 2022-06-08 DIAGNOSIS — R102 Pelvic and perineal pain: Secondary | ICD-10-CM | POA: Diagnosis not present

## 2022-06-08 DIAGNOSIS — R300 Dysuria: Secondary | ICD-10-CM | POA: Diagnosis not present

## 2022-06-30 DIAGNOSIS — Z01419 Encounter for gynecological examination (general) (routine) without abnormal findings: Secondary | ICD-10-CM | POA: Diagnosis not present

## 2022-06-30 DIAGNOSIS — Z124 Encounter for screening for malignant neoplasm of cervix: Secondary | ICD-10-CM | POA: Diagnosis not present

## 2022-10-25 DIAGNOSIS — E039 Hypothyroidism, unspecified: Secondary | ICD-10-CM | POA: Diagnosis not present

## 2022-11-03 ENCOUNTER — Inpatient Hospital Stay: Payer: BC Managed Care – PPO | Admitting: Hematology

## 2022-11-03 IMAGING — CT CT ABD-PELV W/ CM
2 of 5 series · 16 of 46 positions shown, 18 images · IV contrast (APPLIED)
Comparison: None.

CLINICAL DATA: Ascites, abdominal pain

EXAM:
CT ABDOMEN AND PELVIS WITH CONTRAST
TECHNIQUE: Multidetector CT imaging of the abdomen and pelvis was performed
using the standard protocol following bolus administration of
intravenous contrast.

[Series 2: abd pel w · axial · 0.92mm/px · z∈[+682,+1162]mm · 13 of 108 slices shown, 15 images]
[im 6/108  soft-tissue]
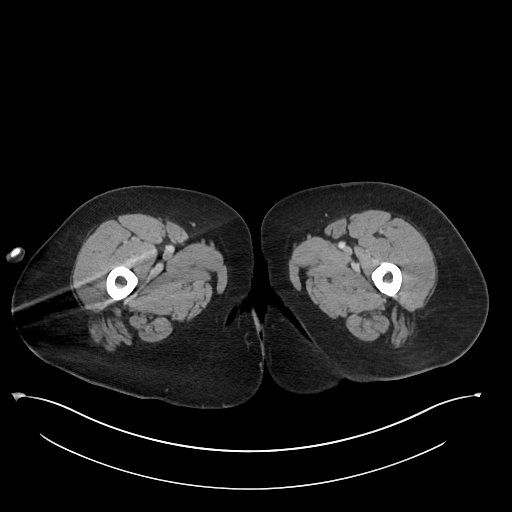
[im 6/108  bone]
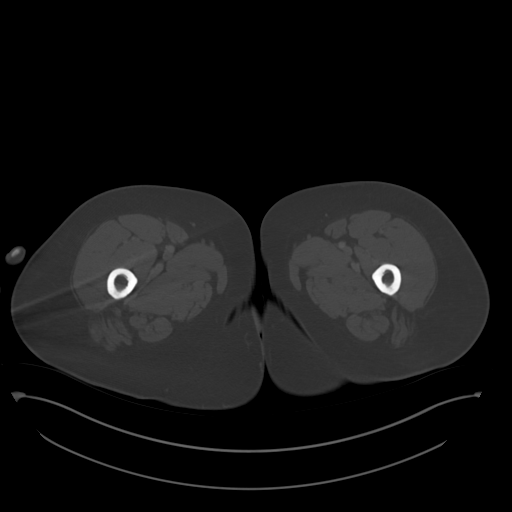
[im 17/108  soft-tissue]
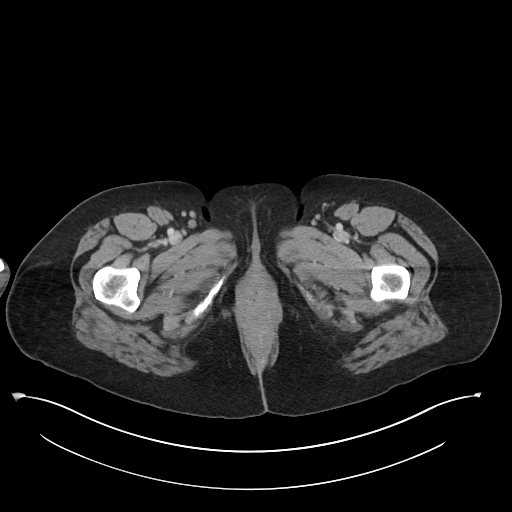
[im 23/108  soft-tissue]
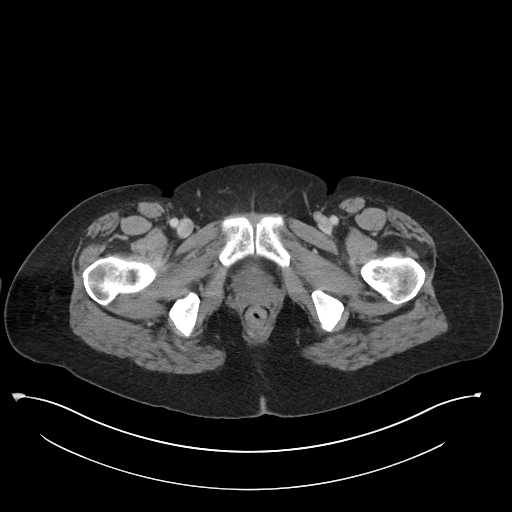
[im 29/108  soft-tissue]
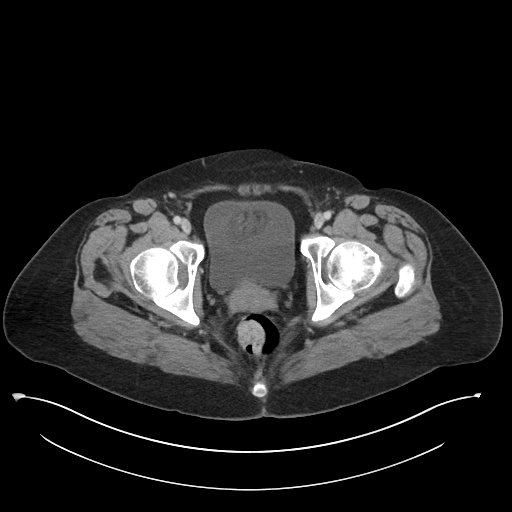
[im 40/108  soft-tissue]
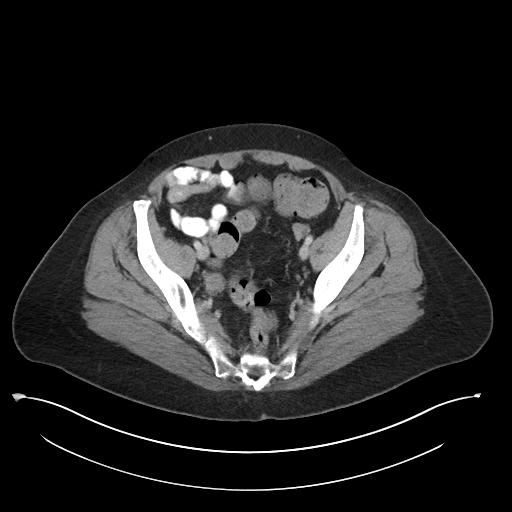
[im 46/108  soft-tissue]
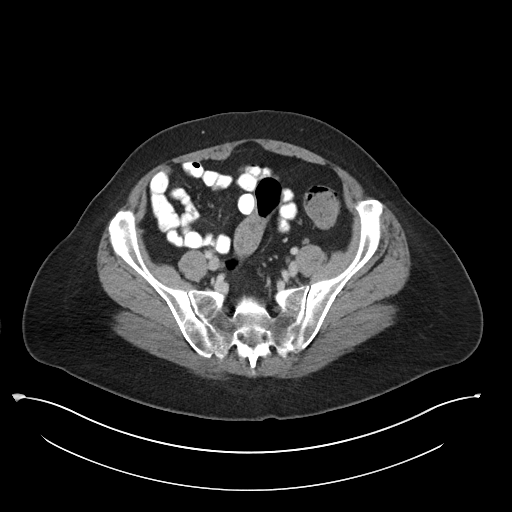
[im 57/108  soft-tissue]
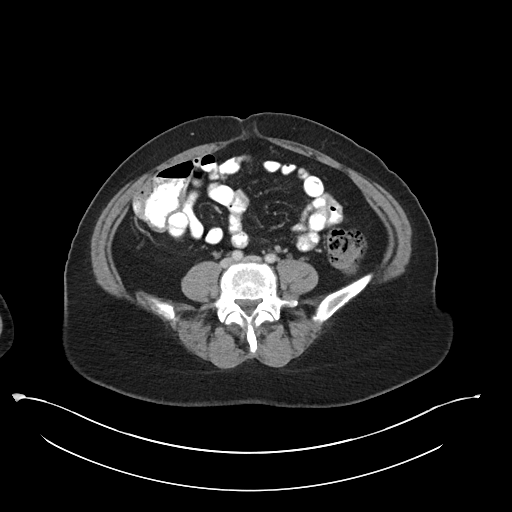
[im 62/108  soft-tissue]
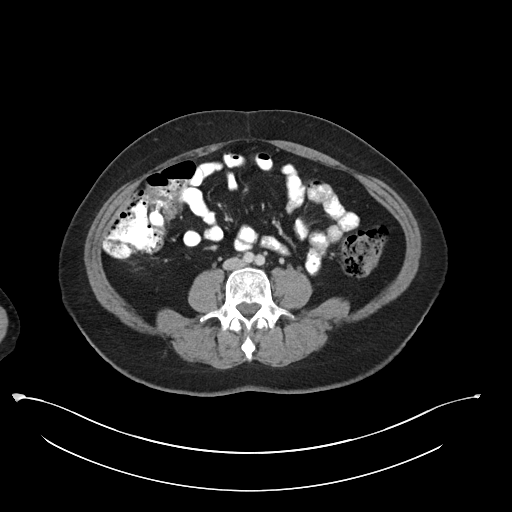
[im 68/108  soft-tissue]
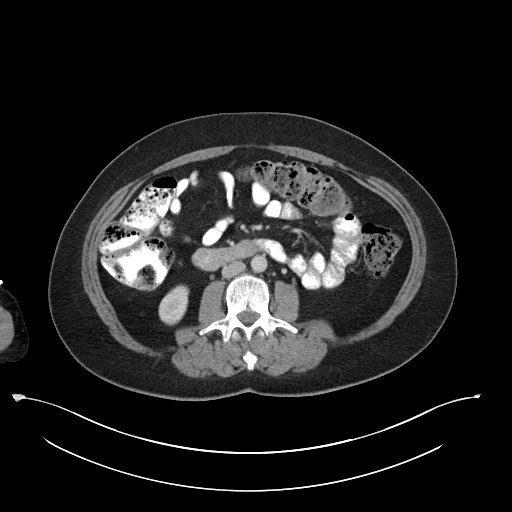
[im 68/108  bone]
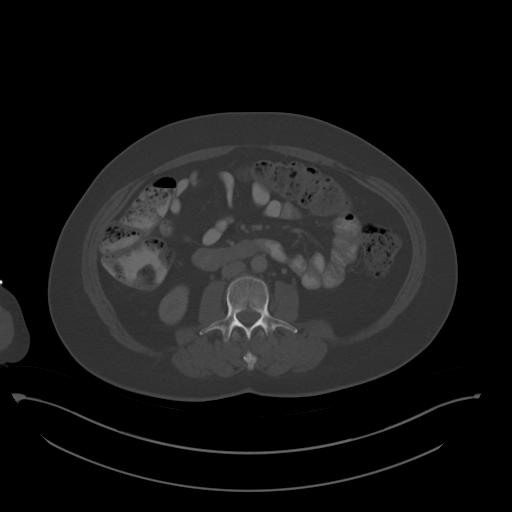
[im 79/108  soft-tissue]
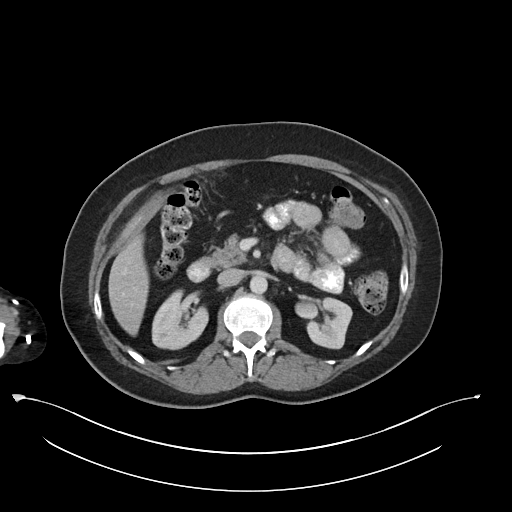
[im 85/108  soft-tissue]
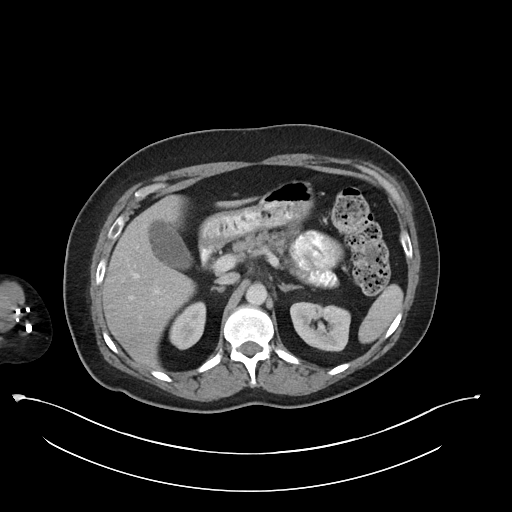
[im 91/108  soft-tissue]
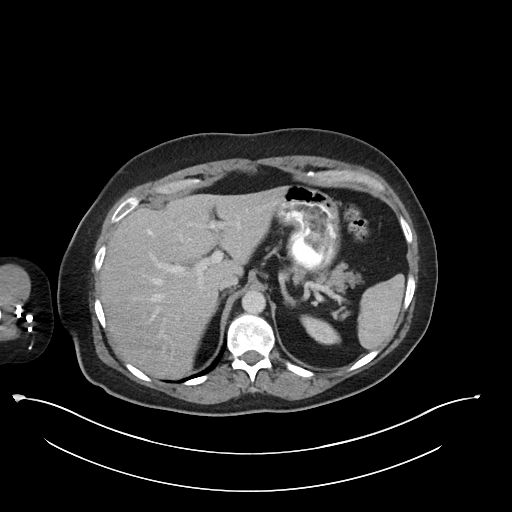
[im 102/108  soft-tissue]
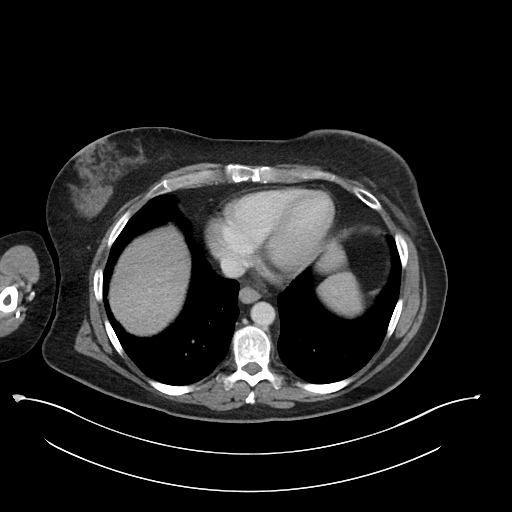

[Series 5: coronal · coronal · 0.95mm/px · 3 of 103 slices shown]
[im 35/103  soft-tissue]
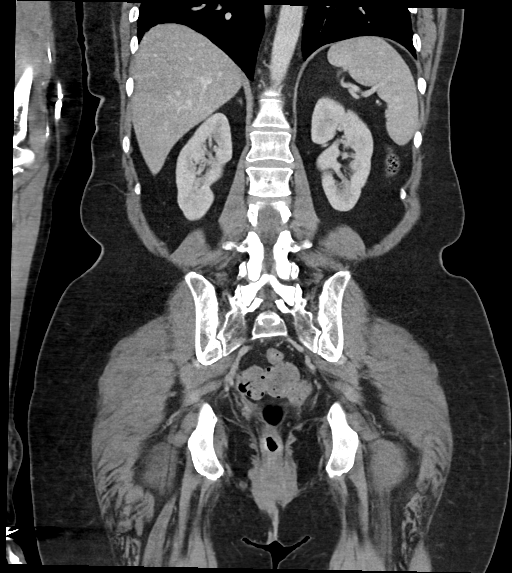
[im 46/103  soft-tissue]
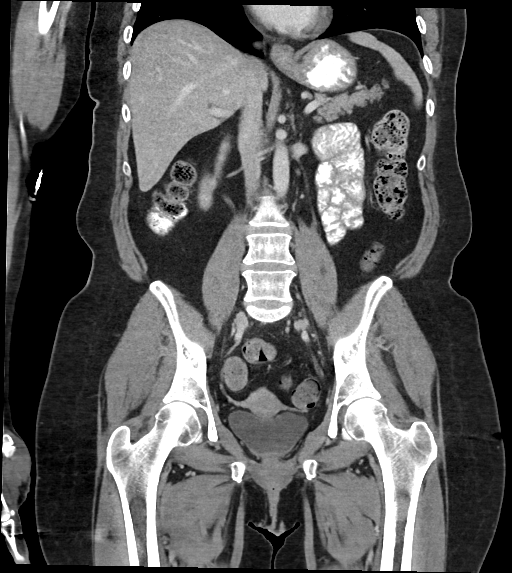
[im 57/103  soft-tissue]
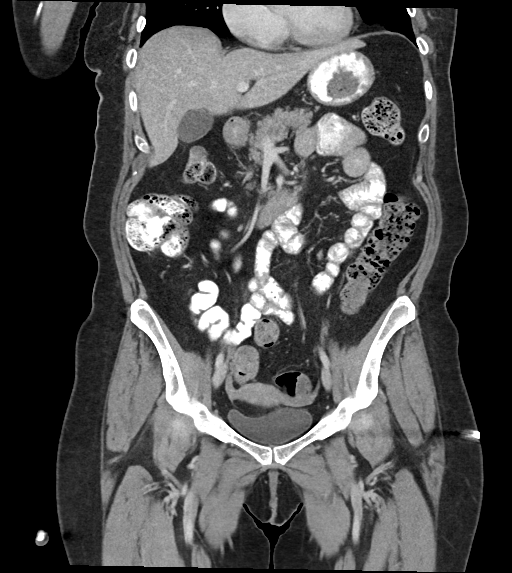

[16 of 46 positions shown; findings below may reference images not displayed]

RADIATION DOSE REDUCTION: This exam was performed according to the
departmental dose-optimization program which includes automated
exposure control, adjustment of the mA and/or kV according to
patient size and/or use of iterative reconstruction technique.

CONTRAST:  85mL OMNIPAQUE IOHEXOL 300 MG/ML  SOLN
FINDINGS: Lower chest: No acute abnormality.

Hepatobiliary: No focal liver abnormality is seen. No gallstones,
gallbladder wall thickening, or biliary dilatation.

Pancreas: Unremarkable. No pancreatic ductal dilatation or
surrounding inflammatory changes.

Spleen: Normal in size without focal abnormality.

Adrenals/Urinary Tract: Adrenal glands are unremarkable. Kidneys are
normal, without renal calculi, focal lesion, or hydronephrosis.
Bladder is unremarkable.

Stomach/Bowel: Small hiatal hernia. No bowel obstruction, free air
or pneumatosis. Large amount of retained fecal material throughout
the colon. No bowel wall edema visualized. No evidence of acute
appendicitis.

Vascular/Lymphatic: No significant vascular findings are present. No
enlarged abdominal or pelvic lymph nodes.

Reproductive: Uterus and bilateral adnexa are unremarkable.

Other: No ascites.

Musculoskeletal: No acute or significant osseous findings.
IMPRESSION: 1. No acute process identified.  No ascites.
2. Small hiatal hernia.
3. Large amount of retained fecal material in the colon.

## 2022-11-25 DIAGNOSIS — H524 Presbyopia: Secondary | ICD-10-CM | POA: Diagnosis not present

## 2022-12-15 DIAGNOSIS — Z170 Estrogen receptor positive status [ER+]: Secondary | ICD-10-CM | POA: Diagnosis not present

## 2022-12-15 DIAGNOSIS — Z923 Personal history of irradiation: Secondary | ICD-10-CM | POA: Diagnosis not present

## 2022-12-15 DIAGNOSIS — C50412 Malignant neoplasm of upper-outer quadrant of left female breast: Secondary | ICD-10-CM | POA: Diagnosis not present

## 2023-01-28 DIAGNOSIS — R739 Hyperglycemia, unspecified: Secondary | ICD-10-CM | POA: Diagnosis not present

## 2023-01-28 DIAGNOSIS — R002 Palpitations: Secondary | ICD-10-CM | POA: Diagnosis not present

## 2023-01-28 DIAGNOSIS — R5383 Other fatigue: Secondary | ICD-10-CM | POA: Diagnosis not present

## 2023-01-28 DIAGNOSIS — E039 Hypothyroidism, unspecified: Secondary | ICD-10-CM | POA: Diagnosis not present

## 2023-01-28 DIAGNOSIS — R079 Chest pain, unspecified: Secondary | ICD-10-CM | POA: Diagnosis not present

## 2023-01-28 DIAGNOSIS — E559 Vitamin D deficiency, unspecified: Secondary | ICD-10-CM | POA: Diagnosis not present

## 2023-01-28 DIAGNOSIS — E782 Mixed hyperlipidemia: Secondary | ICD-10-CM | POA: Diagnosis not present

## 2023-02-04 DIAGNOSIS — R079 Chest pain, unspecified: Secondary | ICD-10-CM | POA: Diagnosis not present

## 2023-02-04 DIAGNOSIS — R002 Palpitations: Secondary | ICD-10-CM | POA: Diagnosis not present

## 2023-02-04 DIAGNOSIS — R5383 Other fatigue: Secondary | ICD-10-CM | POA: Diagnosis not present

## 2023-02-07 ENCOUNTER — Inpatient Hospital Stay: Payer: BC Managed Care – PPO | Attending: Hematology | Admitting: Hematology

## 2023-02-07 VITALS — BP 120/76 | HR 60 | Temp 98.0°F | Resp 17

## 2023-02-07 DIAGNOSIS — C50412 Malignant neoplasm of upper-outer quadrant of left female breast: Secondary | ICD-10-CM | POA: Insufficient documentation

## 2023-02-07 DIAGNOSIS — Z79811 Long term (current) use of aromatase inhibitors: Secondary | ICD-10-CM | POA: Insufficient documentation

## 2023-02-07 DIAGNOSIS — Z170 Estrogen receptor positive status [ER+]: Secondary | ICD-10-CM | POA: Diagnosis not present

## 2023-02-07 NOTE — Progress Notes (Signed)
[facility] [address] [place] [address]   Patient Care Team: [name], MD as PCP - General (Cardiology) Barnetta Chapel Annitta Needs, MD as Referring Physician (Radiation Oncology) Emelia Loron, MD as Consulting Physician (General Surgery) Doreatha Massed, MD as Medical Oncologist (Medical Oncology)  SUMMARY OF ONCOLOGIC HISTORY: Oncology History  Malignant neoplasm of upper-outer quadrant of left breast in female, estrogen receptor positive ([facility])  07/16/2020 Initial Diagnosis   Screening mammogram detected possible asymmetries in the bilateral breasts. 0.5cm mass at the 2:30 position in the left breast, with no left axillary adenopathy, and no focal abnormalities in the right breast. Biopsy showed invasive and in situ ductal carcinoma, grade 1, strong ER/PR positive, HER-2 negative, Ki67 <5%   08/05/2020 Cancer Staging   Staging form: Breast, AJCC 8th Edition - Clinical stage from 08/05/2020: Stage IA (cT1a, cN0, cM0, G1, ER+, PR+, HER2-) - Signed by Serena Croissant, MD on 08/05/2020 Stage prefix: Initial diagnosis Histologic grading system: 3 grade system   08/07/2020 Genetic Testing   Negative genetic testing on the Common hereditary cancer panel.  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  The Common Hereditary Gene Panel offered by [facility] includes sequencing and/or deletion duplication testing of the following 47 genes: APC, ATM, AXIN2, BARD1, BMPR1A, BRCA1, BRCA2, BRIP1, CDH1, CDK4, CDKN2A (p14ARF), CDKN2A (p16INK4a), CHEK2, CTNNA1, DICER1, EPCAM (Deletion/duplication testing only), GREM1 (promoter region deletion/duplication testing only), KIT, MEN1, MLH1, MSH2, MSH3, MSH6, MUTYH, NBN, NF1, NHTL1, PALB2, PDGFRA, PMS2, POLD1, POLE, PTEN, RAD50, RAD51C, RAD51D, SDHB, SDHC, SDHD, SMAD4, SMARCA4. STK11, TP53, TSC1, TSC2, and VHL.  The following genes were evaluated for sequence changes only: SDHA and HOXB13 c.251G>A variant only. The report date is August 07, 2020.   08/14/2020 Surgery   Left lumpectomy Dwain Sarna): invasive and in situ lobular carcinoma, 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    08/14/2020 Cancer Staging   Staging form: Breast, AJCC 8th Edition - Pathologic stage from 08/14/2020: Stage IA (pT1b, pN0, cM0, G1, ER+, PR+, HER2-) - Signed by Loa Socks, NP on 03/02/2021 Stage prefix: Initial diagnosis Histologic grading system: 3 grade system   10/01/2020 - 10/29/2020 Radiation Therapy   Dr. Barnetta Chapel in [place]: Standard radiation therapy to the left breast. This will consist of a dose of 4272 cGy in 16 fractions followed by a boost to the tumor bed of an additional 1000 cGy in 4 fractions. Simulation and treatment treatment are scheduled to begin as soon as possible.    11/2020 -  Anti-estrogen oral therapy   Letrozole daily     CHIEF COMPLIANT: Follow-up of left breast cancer   INTERVAL HISTORY: Ms. Holly Allison is a 57 y.o. female seen for follow-up of left breast cancer.  She reportedly developed some palpitations in the last few months and is being worked up with a stress test.  Has mild soreness at the left breast area since surgery which is stable.  REVIEW OF SYSTEMS:   Review of Systems  Respiratory:  Positive for shortness of breath.   Cardiovascular:  Positive for palpitations.  Neurological:  Positive for numbness.  All other systems reviewed and are negative.   I have reviewed the past medical history, past surgical history, social history and family history with the patient and they are unchanged from previous note.   ALLERGIES:   has No Known Allergies.   MEDICATIONS:  Current Outpatient Medications  Medication Sig Dispense Refill   atorvastatin (LIPITOR) 10 MG tablet  Take 10 mg by mouth daily.     calcium carbonate (OSCAL) 1500 (600 Ca) MG TABS tablet Take 600 mg of elemental calcium by mouth 2 (two) times daily with a meal.     cetirizine (ZYRTEC) 10 MG tablet  Take 10 mg by mouth daily.     cholecalciferol (VITAMIN D3) 25 MCG (1000 UNIT) tablet Take 1,000 Units by mouth 2 (two) times daily.     citalopram (CELEXA) 10 MG tablet Take 2 tablets (20 mg total) by mouth daily. 60 tablet 0   famotidine (PEPCID) 40 MG tablet Take 40 mg by mouth daily.     letrozole (FEMARA) 2.5 MG tablet Take 1 tablet (2.5 mg total) by mouth daily. 90 tablet 3   levothyroxine (SYNTHROID) 50 MCG tablet Take 50 mcg by mouth daily.     triamcinolone cream (KENALOG) 0.1 % SMARTSIG:1 Application Topical 2-3 Times Daily     venlafaxine XR (EFFEXOR-XR) 37.5 MG 24 hr capsule TAKE 1 CAPSULE BY MOUTH ONCE DAILY WITH BREAKFAST 30 capsule 0   No current facility-administered medications for this visit.     PHYSICAL EXAMINATION: Performance status (ECOG): 0 - Asymptomatic  Vitals:   02/07/23 1548  BP: 120/76  Pulse: 60  Resp: 17  Temp: 98 F (36.7 C)  SpO2: 98%   Wt Readings from Last 3 Encounters:  05/05/22 193 lb 9.6 oz (87.8 kg)  10/29/21 182 lb 1.6 oz (82.6 kg)  06/25/21 184 lb (83.5 kg)   Physical Exam Vitals reviewed.  Constitutional:      Appearance: Normal appearance.  Cardiovascular:     Rate and Rhythm: Normal rate and regular rhythm.     Pulses: Normal pulses.     Heart sounds: Normal heart sounds.  Pulmonary:     Effort: Pulmonary effort is normal.     Breath sounds: Normal breath sounds.  Neurological:     General: No focal deficit present.     Mental Status: She is alert and oriented to person, place, and time.  Psychiatric:        Mood and Affect: Mood normal.        Behavior: Behavior normal.       LABORATORY DATA:  I have reviewed the data as listed    Latest Ref Rng & Units 06/09/2021    2:17 PM 08/06/2020    3:15 PM 07/29/2015    1:48 PM  CMP  Glucose 70 - 99 mg/dL 99  95  829   BUN 6 - 20 mg/dL 13  10  10    Creatinine 0.44 - 1.00 mg/dL 5.62  1.30  8.65   Sodium 135 - 145 mmol/L 139  138  143   Potassium 3.5 - 5.1 mmol/L 3.9  3.8   3.9   Chloride 98 - 111 mmol/L 103  102  104   CO2 22 - 32 mmol/L 27  28  30    Calcium 8.9 - 10.3 mg/dL 9.7  9.3  9.4   Total Protein 6.5 - 8.1 g/dL 7.8   6.9   Total Bilirubin 0.3 - 1.2 mg/dL 0.3   0.5   Alkaline Phos 38 - 126 U/L 41   47   AST 15 - 41 U/L 21   15   ALT 0 - 44 U/L 23   14    No results found for: "CAN153" Lab Results  Component Value Date   WBC 5.5 06/09/2021   HGB 11.5 (L) 06/09/2021   HCT 34.5 (L) 06/09/2021  MCV 91.3 06/09/2021   PLT 286 06/09/2021   NEUTROABS 3.2 06/09/2021    ASSESSMENT:  Stage I (PT1BPN0) left breast invasive lobular carcinoma: - Biopsy on 07/16/2020: Invasive mammary carcinoma, grade 1, ER/PR positive, HER2 negative. - Lumpectomy on 08/14/2020: 0.8 cm invasive lobular carcinoma, free margins, 0/4 lymph nodes involved, grade 1, ER 90%, PR 95%, HER2 negative, Ki-67 5%. 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 - Letrozole 2.5 mg daily started around 11/12/2020.  Self discontinued on 10/01/2021 due to arthralgias, severe fatigue, increase in cholesterol and blood sugar.  She was not interested in tamoxifen.    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  She is a non-smoker. - Maternal grandmother had breast cancer.  Maternal great aunt had breast cancer.   PLAN:  Stage I left breast invasive lobular carcinoma: - Last mamm[facility] on [facility]/14/2022 at [facility] was BI-RADS Category 2. - Breast exam was declined.  No palpable axillary or supraclavicular adenopathy. - She is not on any antiestrogen therapy as she could not tolerate them. - Reviewed labs from 01/28/2023: CBC and LFTs were normal. - She will continue yearly mammograms.  RTC 1 year for follow-up.  2.  Bone health: - Labs from 01/28/2023, vitamin D level 66.6.  Continue calcium and vitamin D supplements.   Breast Cancer therapy associated bone loss: I have recommended calcium, Vitamin D and weight  bearing exercises.  Orders placed this encounter:  No orders of the defined types were placed in this encounter.   The patient has a good understanding of the overall plan. She agrees with it. She will call with any problems that may develop before the next visit here.  Doreatha Massed, MD [facility] [phone]

## 2023-02-14 DIAGNOSIS — I493 Ventricular premature depolarization: Secondary | ICD-10-CM | POA: Diagnosis not present

## 2023-02-14 DIAGNOSIS — I491 Atrial premature depolarization: Secondary | ICD-10-CM | POA: Diagnosis not present

## 2023-02-14 DIAGNOSIS — E785 Hyperlipidemia, unspecified: Secondary | ICD-10-CM | POA: Diagnosis not present

## 2023-02-14 DIAGNOSIS — R9439 Abnormal result of other cardiovascular function study: Secondary | ICD-10-CM | POA: Diagnosis not present

## 2023-02-14 DIAGNOSIS — R002 Palpitations: Secondary | ICD-10-CM | POA: Diagnosis not present

## 2023-02-16 DIAGNOSIS — R002 Palpitations: Secondary | ICD-10-CM | POA: Diagnosis not present

## 2023-02-16 DIAGNOSIS — I10 Essential (primary) hypertension: Secondary | ICD-10-CM | POA: Diagnosis not present

## 2023-02-16 DIAGNOSIS — I2510 Atherosclerotic heart disease of native coronary artery without angina pectoris: Secondary | ICD-10-CM | POA: Diagnosis not present

## 2023-02-16 DIAGNOSIS — Z853 Personal history of malignant neoplasm of breast: Secondary | ICD-10-CM | POA: Diagnosis not present

## 2023-02-16 DIAGNOSIS — R9439 Abnormal result of other cardiovascular function study: Secondary | ICD-10-CM | POA: Diagnosis not present

## 2023-02-28 DIAGNOSIS — R002 Palpitations: Secondary | ICD-10-CM | POA: Diagnosis not present

## 2023-02-28 DIAGNOSIS — R9439 Abnormal result of other cardiovascular function study: Secondary | ICD-10-CM | POA: Diagnosis not present

## 2023-03-02 DIAGNOSIS — I493 Ventricular premature depolarization: Secondary | ICD-10-CM | POA: Diagnosis not present

## 2023-03-02 DIAGNOSIS — I491 Atrial premature depolarization: Secondary | ICD-10-CM | POA: Diagnosis not present

## 2023-03-21 DIAGNOSIS — R9439 Abnormal result of other cardiovascular function study: Secondary | ICD-10-CM | POA: Diagnosis not present

## 2023-03-21 DIAGNOSIS — R002 Palpitations: Secondary | ICD-10-CM | POA: Diagnosis not present

## 2023-03-21 DIAGNOSIS — I25118 Atherosclerotic heart disease of native coronary artery with other forms of angina pectoris: Secondary | ICD-10-CM | POA: Diagnosis not present

## 2023-06-01 DIAGNOSIS — Z9889 Other specified postprocedural states: Secondary | ICD-10-CM | POA: Diagnosis not present

## 2023-06-01 DIAGNOSIS — R92333 Mammographic heterogeneous density, bilateral breasts: Secondary | ICD-10-CM | POA: Diagnosis not present

## 2023-06-01 DIAGNOSIS — Z08 Encounter for follow-up examination after completed treatment for malignant neoplasm: Secondary | ICD-10-CM | POA: Diagnosis not present

## 2023-06-01 DIAGNOSIS — R928 Other abnormal and inconclusive findings on diagnostic imaging of breast: Secondary | ICD-10-CM | POA: Diagnosis not present

## 2023-06-01 DIAGNOSIS — Z853 Personal history of malignant neoplasm of breast: Secondary | ICD-10-CM | POA: Diagnosis not present

## 2023-06-30 ENCOUNTER — Other Ambulatory Visit: Payer: Self-pay

## 2023-06-30 ENCOUNTER — Encounter: Payer: Self-pay | Admitting: Internal Medicine

## 2023-06-30 ENCOUNTER — Ambulatory Visit: Payer: BC Managed Care – PPO | Attending: Internal Medicine | Admitting: Internal Medicine

## 2023-06-30 VITALS — BP 132/70 | HR 81 | Ht 64.0 in | Wt 192.8 lb

## 2023-06-30 DIAGNOSIS — I25118 Atherosclerotic heart disease of native coronary artery with other forms of angina pectoris: Secondary | ICD-10-CM

## 2023-06-30 DIAGNOSIS — E785 Hyperlipidemia, unspecified: Secondary | ICD-10-CM | POA: Diagnosis not present

## 2023-06-30 DIAGNOSIS — I2510 Atherosclerotic heart disease of native coronary artery without angina pectoris: Secondary | ICD-10-CM | POA: Insufficient documentation

## 2023-06-30 MED ORDER — REPATHA SURECLICK 140 MG/ML ~~LOC~~ SOAJ: 140.0000 mg | SUBCUTANEOUS | 2 refills | Status: DC

## 2023-06-30 MED ORDER — ISOSORBIDE MONONITRATE ER 30 MG PO TB24: 15.0000 mg | ORAL_TABLET | Freq: Every day | ORAL | 3 refills | Status: DC

## 2023-06-30 MED ORDER — METOPROLOL SUCCINATE ER 25 MG PO TB24: 37.5000 mg | ORAL_TABLET | Freq: Every day | ORAL | 3 refills | Status: DC

## 2023-06-30 NOTE — Progress Notes (Signed)
 Cardiology Office Note  Date: 06/30/2023   ID: Holly Allison, DOB [birth year], MRN [id number]  PCP:  [name], MD  Cardiologist:  Marjo Bicker, MD Electrophysiologist:  None   History of Present Illness: Holly Allison is a 58 y.o. female known to have moderate LAD disease with stable angina, HLD was referred to cardiology clinic to establish care.  Patient started to have exertional substernal chest discomfort associated with burning pain in between her shoulder blades and palpitations, lasting between 5 and 10 minutes and resolves with rest.  Frequency 1-2 times per week.  No recent worsening.  She underwent LHC at [facility] in November 2024 that showed 50 to 60% mid LAD stenosis.  FFR was not performed at that time and the plan was to optimize medical management and perform FFR/PCI if chest pains were refractory to medical management.  Echocardiogram in November 2024 that showed normal LVEF and no valvular heart disease.  She also underwent event monitor on the same plan that showed no evidence of malignant arrhythmias or conduction abnormalities.  Overall, unremarkable event monitor and echocardiogram.  She does not have any strong family history of CAD.  Although her mother and father had history of strokes.  She does not have any personal cardiac risk factors including HTN, DM 2, smoking etc.  She works as a Water quality scientist at [employer].  She does not have any other symptoms of dizziness, syncope, leg swelling DOE.  Past Medical History:  Diagnosis Date   Angio-edema    Cancer [facility])    Family history of adverse reaction to anesthesia    Mother    Heart murmur    Since childhood   Pre-diabetes    Patient states she was told she is prediabetic at yearly physical two months ago. Patient states her A1 C was around 5.6   Urticaria     Past Surgical History:  Procedure Laterality Date   AXILLARY SENTINEL NODE BIOPSY Left 08/14/2020   Procedure: LEFT AXILLARY SENTINEL NODE  BIOPSY;  Surgeon: Emelia Loron, MD;  Location: [facility] OR;  Service: General;  Laterality: Left;   BREAST LUMPECTOMY WITH RADIOACTIVE SEED AND SENTINEL LYMPH NODE BIOPSY Left 08/14/2020   Procedure: LEFT BREAST LUMPECTOMY WITH RADIOACTIVE SEED;  Surgeon: Emelia Loron, MD;  Location: [facility] OR;  Service: General;  Laterality: Left;   CESAREAN SECTION     TONSILLECTOMY      Current Outpatient Medications  Medication Sig Dispense Refill   aspirin EC 81 MG tablet Take 81 mg by mouth daily. Swallow whole.     atorvastatin (LIPITOR) 10 MG tablet Take 10 mg by mouth daily.     calcium carbonate (OSCAL) 1500 (600 Ca) MG TABS tablet Take 600 mg of elemental calcium by mouth 2 (two) times daily with a meal.     cetirizine (ZYRTEC) 10 MG tablet Take 10 mg by mouth daily.     cholecalciferol (VITAMIN D3) 25 MCG (1000 UNIT) tablet Take 1,000 Units by mouth 2 (two) times daily.     citalopram (CELEXA) 10 MG tablet Take 2 tablets (20 mg total) by mouth daily. 60 tablet 0   Evolocumab (REPATHA SURECLICK) 140 MG/ML SOAJ Inject 140 mg into the skin every 14 (fourteen) days. 2 mL 2   famotidine (PEPCID) 40 MG tablet Take 40 mg by mouth daily.     isosorbide mononitrate (IMDUR) 30 MG 24 hr tablet Take 0.5 tablets (15 mg total) by mouth daily. 45 tablet 3   letrozole (  FEMARA) 2.5 MG tablet Take 1 tablet (2.5 mg total) by mouth daily. 90 tablet 3   levothyroxine (SYNTHROID) 50 MCG tablet Take 50 mcg by mouth daily.     metoprolol succinate (TOPROL XL) 25 MG 24 hr tablet Take 1.5 tablets (37.5 mg total) by mouth daily. 135 tablet 3   triamcinolone cream (KENALOG) 0.1 % SMARTSIG:1 Application Topical 2-3 Times Daily     venlafaxine XR (EFFEXOR-XR) 37.5 MG 24 hr capsule TAKE 1 CAPSULE BY MOUTH ONCE DAILY WITH BREAKFAST 30 capsule 0   No current facility-administered medications for this visit.   Allergies:  Patient has no known allergies.   Social History: The patient  reports that she has never smoked. She has  never used smokeless tobacco. She reports that she does not drink alcohol and does not use drugs.   Family History: The patient's family history is not on file.   ROS:  Please see the history of present illness. Otherwise, complete review of systems is positive for none  All other systems are reviewed and negative.   Physical Exam: VS:  BP 132/70   Pulse 81   Ht 5\' 4"  (1.626 m)   Wt 192 lb 12.8 oz (87.5 kg)   LMP 07/07/2014   SpO2 98%   BMI 33.09 kg/m , BMI Body mass index is 33.09 kg/m.  Wt Readings from Last 3 Encounters:  06/30/23 192 lb 12.8 oz (87.5 kg)  05/05/22 193 lb 9.6 oz (87.8 kg)  10/29/21 182 lb 1.6 oz (82.6 kg)    General: Patient appears comfortable at rest. HEENT: Conjunctiva and lids normal, oropharynx clear with moist mucosa. Neck: Supple, no elevated JVP or carotid bruits, no thyromegaly. Lungs: Clear to auscultation, nonlabored breathing at rest. Cardiac: Regular rate and rhythm, no S3 or significant systolic murmur, no pericardial rub. Abdomen: Soft, nontender, no hepatomegaly, bowel sounds present, no guarding or rebound. Extremities: No pitting edema, distal pulses 2+. Skin: Warm and dry. Musculoskeletal: No kyphosis. Neuropsychiatric: Alert and oriented x3, affect grossly appropriate.  Recent Labwork: No results found for requested labs within last 365 days.  No results found for: "CHOL", "TRIG", "HDL", "CHOLHDL", "VLDL", "LDLCALC", "LDLDIRECT"    Assessment and Plan:  CAD manifested by stable angina s/p moderate obstructive CAD in the mid LAD (50 to 60% stenosis): Continues to have substernal chest discomfort associated with burning sensation in between her shoulder blades and palpitations, with overexertion like walking excessively, lasts for 5 to 10 minutes and resolves with rest.  Frequency 1-2 times per week.  She underwent LHC in November 2024 at [facility] that showed mid LAD 50 to 60% stenosis but FFR was not performed at that time.  Plan was to  optimize medical management and perform FFR with PCI in the future if she has medically refractory chest pains.  Currently on metoprolol succinate 25 mg once daily which I will increase to 37.5 mg once daily, start Imdur 15 mg once daily.  Discussed the side effects of Imdur including dizziness, headaches.  If she develops headaches, needs to discontinue Imdur.  Continue aspirin 81 mg once daily.  She tried simvastatin, rosuvastatin in the past with myalgias.  Currently on atorvastatin 10 mg nightly but she still has myalgias.  Will start Repatha 140 mg every 2 weeks.  Okay to discontinue atorvastatin after she starts Repatha.  Will see her back in 4 months with a fasting lipid panel.  Due to CAD with stable angina, will refer her to cardiac rehab.  She  is also not comfortable exercising by herself.  HLD, not at goal: I reviewed her lipid panel in October 2024 that showed TG 179 and LDL 149, both elevated.  She has myalgias to simvastatin and rosuvastatin.  Currently on atorvastatin 10 mg nightly but she still has myalgias. Will start Repatha 140 mg every 2 weeks.  Okay to discontinue atorvastatin after she starts Repatha.  Will see her back in 4 months with a fasting lipid panel.  She does not have any strong family history of CAD nor she does have any cardiac risk factors.  Obtain lipoprotein a levels.      Medication Adjustments/Labs and Tests Ordered: Current medicines are reviewed at length with the patient today.  Concerns regarding medicines are outlined above.    Disposition:  Follow up  4 months  Signed Reginal Wojcicki Verne Spurr, MD, 06/30/2023 2:35 PM    [facility] at [facility] [address] [place], [place], [place] [address]

## 2023-06-30 NOTE — Patient Instructions (Signed)
 Medication Instructions:  Your physician has recommended you make the following change in your medication:  Start Imdur 15 mg once daily Increase Metoprolol from 25 mg to 37.5 mg once daily Start Repatha 140 mg every 14 days  Continue taking all other medications as prescribed  Labwork: Fasting lipid panel and Lipoprotein-a to be completed in 4 months at [facility] prior to 4 month visit  Testing/Procedures: None  Follow-Up: Your physician recommends that you schedule a follow-up appointment in: 4 months  Any Other Special Instructions Will Be Listed Below (If Applicable). Referral to Cardiac Rehab  Thank you for choosing [facility]!     If you need a refill on your cardiac medications before your next appointment, please call your pharmacy.

## 2023-06-30 NOTE — Progress Notes (Signed)
 Removed Atorvastatin is no longer taking and is taking Zetia 10 mg.Will update patient's list and patient is aware of changes.

## 2023-07-04 ENCOUNTER — Telehealth: Payer: Self-pay | Admitting: Pharmacy Technician

## 2023-07-04 ENCOUNTER — Encounter (HOSPITAL_COMMUNITY): Payer: Self-pay

## 2023-07-04 ENCOUNTER — Other Ambulatory Visit (HOSPITAL_COMMUNITY): Payer: Self-pay

## 2023-07-04 NOTE — Telephone Encounter (Signed)
 Pharmacy Patient Advocate Encounter   Received notification from CoverMyMeds that prior authorization for Repatha required/requested.   Insurance verification completed.   The patient is insured through [facility] .   Per test claim: PA required; PA submitted to above mentioned insurance via CoverMyMeds Key/confirmation #/EOC [id number] Status is pending

## 2023-07-04 NOTE — Telephone Encounter (Signed)
 Pharmacy Patient Advocate Encounter  Received notification from [facility] that Prior Authorization for Repatha has been APPROVED from 07/04/23 to 07/03/24. Ran test claim, Copay is $0.00- one month. This test claim was processed through [facility]- copay amounts may vary at other pharmacies due to pharmacy/plan contracts, or as the patient moves through the different stages of their insurance plan.   PA #/Case ID/Reference #: [id number]

## 2023-07-06 ENCOUNTER — Telehealth (HOSPITAL_COMMUNITY): Payer: Self-pay

## 2023-07-06 ENCOUNTER — Encounter (HOSPITAL_COMMUNITY)
Admission: RE | Admit: 2023-07-06 | Discharge: 2023-07-06 | Disposition: A | Discharge status: Home / Self Care | Source: Ambulatory Visit | Attending: Internal Medicine | Admitting: Internal Medicine

## 2023-07-06 DIAGNOSIS — I2089 Other forms of angina pectoris: Secondary | ICD-10-CM | POA: Insufficient documentation

## 2023-07-06 NOTE — Progress Notes (Signed)
 Completed virtual orientation today. EP evaluation is scheduled for pending date and time as patient needs to re-schedule her in-person appt. Documentation for diagnosis can be found in [facility] encounter 06/30/23.  Will message patient through my-chart about appt re-schedule.

## 2023-07-13 ENCOUNTER — Encounter (HOSPITAL_COMMUNITY)

## 2023-07-20 ENCOUNTER — Ambulatory Visit (HOSPITAL_COMMUNITY)

## 2023-08-03 ENCOUNTER — Encounter (HOSPITAL_COMMUNITY)
Admission: RE | Admit: 2023-08-03 | Discharge: 2023-08-03 | Disposition: A | Discharge status: Home / Self Care | Source: Ambulatory Visit | Attending: Internal Medicine | Admitting: Internal Medicine

## 2023-08-03 VITALS — Ht 64.0 in

## 2023-08-03 DIAGNOSIS — I2089 Other forms of angina pectoris: Secondary | ICD-10-CM | POA: Insufficient documentation

## 2023-08-03 NOTE — Patient Instructions (Signed)
 Patient Instructions  Patient Details  Name: Holly Allison MRN: [id number] Date of Birth: 07/24/1965 Referring Provider:  Mallipeddi, Vishnu P, MD  Below are your personal goals for exercise, nutrition, and risk factors. Our goal is to help you stay on track towards obtaining and maintaining these goals. We will be discussing your progress on these goals with you throughout the program.  Initial Exercise Prescription:  Initial Exercise Prescription - 08/03/23 1500       Date of Initial Exercise RX and Referring Provider   Date 08/03/23    Referring Provider Mallipeddi, Vishnu      Treadmill   MPH 1.8    Grade 0    Minutes 15    METs 2.38      REL-XR   Level 1    Speed 60    Minutes 15    METs 1.9      Prescription Details   Frequency (times per week) 2    Duration Progress to 30 minutes of continuous aerobic without signs/symptoms of physical distress      Intensity   THRR 40-80% of Max Heartrate 95-142    Ratings of Perceived Exertion 11-13    Perceived Dyspnea 0-4      Resistance Training   Training Prescription Yes    Weight 4    Reps 10-15             Exercise Goals: Frequency: Be able to perform aerobic exercise two to three times per week in program working toward 2-5 days per week of home exercise.  Intensity: Work with a perceived exertion of 11 (fairly light) - 15 (hard) while following your exercise prescription.  We will make changes to your prescription with you as you progress through the program.   Duration: Be able to do 30 to 45 minutes of continuous aerobic exercise in addition to a 5 minute warm-up and a 5 minute cool-down routine.   Nutrition Goals: Your personal nutrition goals will be established when you do your nutrition analysis with the dietician.  The following are general nutrition guidelines to follow: Cholesterol < 200mg /day Sodium < 1500mg /day Fiber: Women over 50 yrs - 21 grams per day  Personal Goals:  Personal Goals  and Risk Factors at Admission - 07/06/23 1547       Core Components/Risk Factors/Patient Goals on Admission    Weight Management Yes    Intervention Weight Management: Develop a combined nutrition and exercise program designed to reach desired caloric intake, while maintaining appropriate intake of nutrient and fiber, sodium and fats, and appropriate energy expenditure required for the weight goal.;Weight Management: Provide education and appropriate resources to help participant work on and attain dietary goals.    Expected Outcomes Short Term: Continue to assess and modify interventions until short term weight is achieved;Long Term: Adherence to nutrition and physical activity/exercise program aimed toward attainment of established weight goal;Understanding of distribution of calorie intake throughout the day with the consumption of 4-5 meals/snacks;Understanding recommendations for meals to include 15-35% energy as protein, 25-35% energy from fat, 35-60% energy from carbohydrates, less than 200mg  of dietary cholesterol, 20-35 gm of total fiber daily    Lipids Yes    Intervention Provide education and support for participant on nutrition & aerobic/resistive exercise along with prescribed medications to achieve LDL 70mg , HDL >40mg .    Expected Outcomes Short Term: Participant states understanding of desired cholesterol values and is compliant with medications prescribed. Participant is following exercise prescription and nutrition guidelines.;Long Term: Cholesterol  controlled with medications as prescribed, with individualized exercise RX and with personalized nutrition plan. Value goals: LDL < 70mg , HDL > 40 mg.    Stress Yes    Intervention Offer individual and/or small group education and counseling on adjustment to heart disease, stress management and health-related lifestyle change. Teach and support self-help strategies.;Refer participants experiencing significant psychosocial distress to  appropriate mental health specialists for further evaluation and treatment. When possible, include family members and significant others in education/counseling sessions.    Expected Outcomes Short Term: Participant demonstrates changes in health-related behavior, relaxation and other stress management skills, ability to obtain effective social support, and compliance with psychotropic medications if prescribed.;Long Term: Emotional wellbeing is indicated by absence of clinically significant psychosocial distress or social isolation.             Tobacco Use Initial Evaluation: Social History   Tobacco Use  Smoking Status Never  Smokeless Tobacco Never    Exercise Goals and Review:  Exercise Goals     Row Name 08/03/23 1534             Exercise Goals   Increase Physical Activity Yes       Intervention Provide advice, education, support and counseling about physical activity/exercise needs.;Develop an individualized exercise prescription for aerobic and resistive training based on initial evaluation findings, risk stratification, comorbidities and participant's personal goals.       Expected Outcomes Short Term: Attend rehab on a regular basis to increase amount of physical activity.;Long Term: Add in home exercise to make exercise part of routine and to increase amount of physical activity.;Long Term: Exercising regularly at least 3-5 days a week.       Increase Strength and Stamina Yes       Intervention Provide advice, education, support and counseling about physical activity/exercise needs.;Develop an individualized exercise prescription for aerobic and resistive training based on initial evaluation findings, risk stratification, comorbidities and participant's personal goals.       Expected Outcomes Short Term: Perform resistance training exercises routinely during rehab and add in resistance training at home;Short Term: Increase workloads from initial exercise prescription for  resistance, speed, and METs.;Long Term: Improve cardiorespiratory fitness, muscular endurance and strength as measured by increased METs and functional capacity ( )       Able to understand and use rate of perceived exertion (RPE) scale Yes       Intervention Provide education and explanation on how to use RPE scale       Expected Outcomes Short Term: Able to use RPE daily in rehab to express subjective intensity level;Long Term:  Able to use RPE to guide intensity level when exercising independently       Knowledge and understanding of Target Heart Rate Range (THRR) Yes       Intervention Provide education and explanation of THRR including how the numbers were predicted and where they are located for reference       Expected Outcomes Short Term: Able to state/look up THRR;Long Term: Able to use THRR to govern intensity when exercising independently;Short Term: Able to use daily as guideline for intensity in rehab       Able to check pulse independently Yes       Intervention Provide education and demonstration on how to check pulse in carotid and radial arteries.;Review the importance of being able to check your own pulse for safety during independent exercise       Expected Outcomes Long Term: Able to check pulse independently  and accurately;Short Term: Able to explain why pulse checking is important during independent exercise       Understanding of Exercise Prescription Yes       Intervention Provide education, explanation, and written materials on patient's individual exercise prescription       Expected Outcomes Short Term: Able to explain program exercise prescription;Long Term: Able to explain home exercise prescription to exercise independently                Copy of goals given to participant.

## 2023-08-03 NOTE — Progress Notes (Cosign Needed Addendum)
 Cardiac Individual Treatment Plan  Patient Details  Name: Holly Allison MRN: [id number] Date of Birth: July 26, 1965 Referring Provider:   Flowsheet Row CARDIAC REHAB PHASE II ORIENTATION from 08/03/2023 in [facility] CARDIAC REHABILITATION  Referring Provider Mallipeddi, Vishnu       Initial Encounter Date:  Flowsheet Row CARDIAC REHAB PHASE II ORIENTATION from 08/03/2023 in [place] CARDIAC REHABILITATION  Date 08/03/23       Visit Diagnosis: Stable angina (HCC)  Patient's Home Medications on Admission:  Current Outpatient Medications:    aspirin EC 81 MG tablet, Take 81 mg by mouth daily. Swallow whole., Disp: , Rfl:    calcium  carbonate (OSCAL) 1500 (600 Ca) MG TABS tablet, Take 600 mg of elemental calcium  by mouth 2 (two) times daily with a meal., Disp: , Rfl:    cetirizine (ZYRTEC) 10 MG tablet, Take 10 mg by mouth daily., Disp: , Rfl:    cholecalciferol (VITAMIN D3) 25 MCG (1000 UNIT) tablet, Take 1,000 Units by mouth 2 (two) times daily., Disp: , Rfl:    citalopram  (CELEXA ) 10 MG tablet, Take 2 tablets (20 mg total) by mouth daily. (Patient not taking: Reported on 07/06/2023), Disp: 60 tablet, Rfl: 0   Evolocumab  (REPATHA  SURECLICK) 140 MG/ML SOAJ, Inject 140 mg into the skin every 14 (fourteen) days. (Patient not taking: Reported on 07/06/2023), Disp: 2 mL, Rfl: 2   ezetimibe (ZETIA) 10 MG tablet, Take 10 mg by mouth daily., Disp: , Rfl:    famotidine  (PEPCID ) 40 MG tablet, Take 40 mg by mouth daily., Disp: , Rfl:    isosorbide  mononitrate (IMDUR ) 30 MG 24 hr tablet, Take 0.5 tablets (15 mg total) by mouth daily., Disp: 45 tablet, Rfl: 3   letrozole  (FEMARA ) 2.5 MG tablet, Take 1 tablet (2.5 mg total) by mouth daily. (Patient not taking: Reported on 07/06/2023), Disp: 90 tablet, Rfl: 3   levothyroxine (SYNTHROID) 50 MCG tablet, Take 50 mcg by mouth daily., Disp: , Rfl:    metoprolol  succinate (TOPROL  XL) 25 MG 24 hr tablet, Take 1.5 tablets (37.5 mg total) by mouth daily.,  Disp: 135 tablet, Rfl: 3   triamcinolone cream (KENALOG) 0.1 %, SMARTSIG:1 Application Topical 2-3 Times Daily (Patient not taking: Reported on 07/06/2023), Disp: , Rfl:    venlafaxine  XR (EFFEXOR -XR) 37.5 MG 24 hr capsule, TAKE 1 CAPSULE BY MOUTH ONCE DAILY WITH BREAKFAST (Patient not taking: Reported on 07/06/2023), Disp: 30 capsule, Rfl: 0  Past Medical History: Past Medical History:  Diagnosis Date   Angio-edema    Cancer (HCC)    Family history of adverse reaction to anesthesia    Mother    Heart murmur    Since childhood   Pre-diabetes    Patient states she was told she is prediabetic at yearly physical two months ago. Patient states her A1 C was around 5.6   Urticaria     Tobacco Use: Social History   Tobacco Use  Smoking Status Never  Smokeless Tobacco Never    Labs: Review Flowsheet       Latest Ref Rng & Units 08/06/2020  Labs for ITP Cardiac and Pulmonary Rehab  Hemoglobin A1c 4.8 - 5.6 % 5.5     Capillary Blood Glucose: Lab Results  Component Value Date   GLUCAP 124 (H) 08/30/2014     Exercise Target Goals: Exercise Program Goal: Individual exercise prescription set using results from initial 6 min walk test and THRR while considering  patient's activity barriers and safety.   Exercise Prescription Goal: Starting  with aerobic activity 30 plus minutes a day, 3 days per week for initial exercise prescription. Provide home exercise prescription and guidelines that participant acknowledges understanding prior to discharge.  Activity Barriers & Risk Stratification:  Activity Barriers & Cardiac Risk Stratification - 07/06/23 1549       Activity Barriers & Cardiac Risk Stratification   Activity Barriers Chest Pain/Angina    Cardiac Risk Stratification Moderate             6 Minute Walk:  6 Minute Walk     Row Name 08/03/23 1524         6 Minute Walk   Phase Initial     Distance 1140 feet     Walk Time 6 minutes     # of Rest Breaks 0      MPH 2.16     METS 2.74     RPE 12     Perceived Dyspnea  2     VO2 Peak 9.6     Symptoms Yes (comment)     Comments 5/10 burning between shoulder blades     Resting HR 57 bpm     Resting BP 120/66     Resting Oxygen Saturation  96 %     Exercise Oxygen Saturation  during 6 min walk 96 %     Max Ex. HR 78 bpm     Max Ex. BP 122/68     2 Minute Post BP 118/62              Oxygen Initial Assessment:   Oxygen Re-Evaluation:   Oxygen Discharge (Final Oxygen Re-Evaluation):   Initial Exercise Prescription:  Initial Exercise Prescription - 08/03/23 1500       Date of Initial Exercise RX and Referring Provider   Date 08/03/23    Referring Provider Mallipeddi, Vishnu      Treadmill   MPH 1.8    Grade 0    Minutes 15    METs 2.38      REL-XR   Level 1    Speed 60    Minutes 15    METs 1.9      Prescription Details   Frequency (times per week) 2    Duration Progress to 30 minutes of continuous aerobic without signs/symptoms of physical distress      Intensity   THRR 40-80% of Max Heartrate 95-142    Ratings of Perceived Exertion 11-13    Perceived Dyspnea 0-4      Resistance Training   Training Prescription Yes    Weight 4    Reps 10-15             Perform Capillary Blood Glucose checks as needed.  Exercise Prescription Changes:   Exercise Prescription Changes     Row Name 08/03/23 1500             Response to Exercise   Blood Pressure (Admit) 120/66       Blood Pressure (Exercise) 122/68       Blood Pressure (Exit) 118/62       Heart Rate (Admit) 57 bpm       Heart Rate (Exercise) 78 bpm       Heart Rate (Exit) 62 bpm       Oxygen Saturation (Admit) 96 %       Oxygen Saturation (Exercise) 96 %       Oxygen Saturation (Exit) 96 %       Rating of Perceived Exertion (Exercise)  12       Perceived Dyspnea (Exercise) 2       Symptoms buring in shoulder blands                Exercise Comments:   Exercise Goals and Review:    Exercise Goals     Row Name 08/03/23 1534             Exercise Goals   Increase Physical Activity Yes       Intervention Provide advice, education, support and counseling about physical activity/exercise needs.;Develop an individualized exercise prescription for aerobic and resistive training based on initial evaluation findings, risk stratification, comorbidities and participant's personal goals.       Expected Outcomes Short Term: Attend rehab on a regular basis to increase amount of physical activity.;Long Term: Add in home exercise to make exercise part of routine and to increase amount of physical activity.;Long Term: Exercising regularly at least 3-5 days a week.       Increase Strength and Stamina Yes       Intervention Provide advice, education, support and counseling about physical activity/exercise needs.;Develop an individualized exercise prescription for aerobic and resistive training based on initial evaluation findings, risk stratification, comorbidities and participant's personal goals.       Expected Outcomes Short Term: Perform resistance training exercises routinely during rehab and add in resistance training at home;Short Term: Increase workloads from initial exercise prescription for resistance, speed, and METs.;Long Term: Improve cardiorespiratory fitness, muscular endurance and strength as measured by increased METs and functional capacity ( )       Able to understand and use rate of perceived exertion (RPE) scale Yes       Intervention Provide education and explanation on how to use RPE scale       Expected Outcomes Short Term: Able to use RPE daily in rehab to express subjective intensity level;Long Term:  Able to use RPE to guide intensity level when exercising independently       Knowledge and understanding of Target Heart Rate Range (THRR) Yes       Intervention Provide education and explanation of THRR including how the numbers were predicted and where they are  located for reference       Expected Outcomes Short Term: Able to state/look up THRR;Long Term: Able to use THRR to govern intensity when exercising independently;Short Term: Able to use daily as guideline for intensity in rehab       Able to check pulse independently Yes       Intervention Provide education and demonstration on how to check pulse in carotid and radial arteries.;Review the importance of being able to check your own pulse for safety during independent exercise       Expected Outcomes Long Term: Able to check pulse independently and accurately;Short Term: Able to explain why pulse checking is important during independent exercise       Understanding of Exercise Prescription Yes       Intervention Provide education, explanation, and written materials on patient's individual exercise prescription       Expected Outcomes Short Term: Able to explain program exercise prescription;Long Term: Able to explain home exercise prescription to exercise independently                Exercise Goals Re-Evaluation :    Discharge Exercise Prescription (Final Exercise Prescription Changes):  Exercise Prescription Changes - 08/03/23 1500       Response to Exercise   Blood Pressure (Admit) 120/66  Blood Pressure (Exercise) 122/68    Blood Pressure (Exit) 118/62    Heart Rate (Admit) 57 bpm    Heart Rate (Exercise) 78 bpm    Heart Rate (Exit) 62 bpm    Oxygen Saturation (Admit) 96 %    Oxygen Saturation (Exercise) 96 %    Oxygen Saturation (Exit) 96 %    Rating of Perceived Exertion (Exercise) 12    Perceived Dyspnea (Exercise) 2    Symptoms buring in shoulder blands             Nutrition:  Target Goals: Understanding of nutrition guidelines, daily intake of sodium 1500mg , cholesterol 200mg , calories 30% from fat and 7% or less from saturated fats, daily to have 5 or more servings of fruits and vegetables.  Biometrics:  Pre Biometrics - 08/03/23 1535       Pre Biometrics    Height 5\' 4"  (1.626 m)    Waist Circumference 38.5 inches    Hip Circumference 44 inches    Waist to Hip Ratio 0.88 %    Grip Strength 23.4 kg    Single Leg Stand 25.46 seconds              Nutrition Therapy Plan and Nutrition Goals:  Nutrition Therapy & Goals - 07/06/23 1547       Intervention Plan   Intervention Prescribe, educate and counsel regarding individualized specific dietary modifications aiming towards targeted core components such as weight, hypertension, lipid management, diabetes, heart failure and other comorbidities.;Nutrition handout(s) given to patient.    Expected Outcomes Short Term Goal: Understand basic principles of dietary content, such as calories, fat, sodium, cholesterol and nutrients.;Long Term Goal: Adherence to prescribed nutrition plan.;Short Term Goal: A plan has been developed with personal nutrition goals set during dietitian appointment.             Nutrition Assessments:  MEDIFICTS Score Key: >=70 Need to make dietary changes  40-70 Heart Healthy Diet <= 40 Therapeutic Level Cholesterol Diet  Flowsheet Row CARDIAC VIRTUAL BASED CARE from 07/06/2023 in [facility] CARDIAC REHABILITATION  Picture Your Plate Total Score on Admission 50      Picture Your Plate Scores: <47 Unhealthy dietary pattern with much room for improvement. 41-50 Dietary pattern unlikely to meet recommendations for good health and room for improvement. 51-60 More healthful dietary pattern, with some room for improvement.  >60 Healthy dietary pattern, although there may be some specific behaviors that could be improved.    Nutrition Goals Re-Evaluation:   Nutrition Goals Discharge (Final Nutrition Goals Re-Evaluation):   Psychosocial: Target Goals: Acknowledge presence or absence of significant depression and/or stress, maximize coping skills, provide positive support system. Participant is able to verbalize types and ability to use techniques and skills  needed for reducing stress and depression.  Initial Review & Psychosocial Screening:  Initial Psych Review & Screening - 07/06/23 1533       Initial Review   Current issues with Current Stress Concerns    Source of Stress Concerns Chronic Illness;Family    Comments Stressed about her health and her husbands health.      Family Dynamics   Good Support System? Yes    Comments Patient has her spouse and her daughter as support system.      Barriers   Psychosocial barriers to participate in program The patient should benefit from training in stress management and relaxation.      Screening Interventions   Interventions Encouraged to exercise;To provide support and resources with  identified psychosocial needs    Expected Outcomes Short Term goal: Utilizing psychosocial counselor, staff and physician to assist with identification of specific Stressors or current issues interfering with healing process. Setting desired goal for each stressor or current issue identified.;Short Term goal: Identification and review with participant of any Quality of Life or Depression concerns found by scoring the questionnaire.;Long Term Goal: Stressors or current issues are controlled or eliminated.;Long Term goal: The participant improves quality of Life and PHQ9 Scores as seen by post scores and/or verbalization of changes             Quality of Life Scores:  Quality of Life - 07/20/23 0755       Quality of Life   Select Quality of Life      Quality of Life Scores   Health/Function Pre 20 %    Socioeconomic Pre 21.44 %    Psych/Spiritual Pre 23.36 %    Family Pre 23.5 %    GLOBAL Pre 21.5 %            Scores of 19 and below usually indicate a poorer quality of life in these areas.  A difference of  2-3 points is a clinically meaningful difference.  A difference of 2-3 points in the total score of the Quality of Life Index has been associated with significant improvement in overall quality of  life, self-image, physical symptoms, and general health in studies assessing change in quality of life.  PHQ-9: Review Flowsheet       08/03/2023  Depression screen PHQ 2/9  Decreased Interest 0  Down, Depressed, Hopeless 0  PHQ - 2 Score 0  Altered sleeping 0  Tired, decreased energy 3  Change in appetite 0  Feeling bad or failure about yourself  0  Trouble concentrating 1  Moving slowly or fidgety/restless 0  Suicidal thoughts 0  PHQ-9 Score 4   Interpretation of Total Score  Total Score Depression Severity:  1-4 = Minimal depression, 5-9 = Mild depression, 10-14 = Moderate depression, 15-19 = Moderately severe depression, 20-27 = Severe depression   Psychosocial Evaluation and Intervention:  Psychosocial Evaluation - 07/06/23 1540       Psychosocial Evaluation & Interventions   Interventions Stress management education;Relaxation education;Encouraged to exercise with the program and follow exercise prescription    Comments Holly Allison is a pleasant young woman who is coming to rehab for her stable angina. 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 She states the chest pain isn't constant, but only when she exerts herself. She has a good support system from her husband and daughter, which her daughter is a CNA here at the hospital. Holly Allison does have some stressors in her life with her health and her husband's issues as he has been sick the last few months. She currently does not do any exercise at home, but is looking forward to starting exercise here.    Expected Outcomes Short: Attend Rehab regularly. Long: Work on stress reduction techniques.    Continue Psychosocial Services  Follow up required by staff             Psychosocial Re-Evaluation:   Psychosocial Discharge (Final Psychosocial Re-Evaluation):   Vocational Rehabilitation: Provide vocational rehab assistance to qualifying candidates.   Vocational Rehab Evaluation &  Intervention:  Vocational Rehab - 07/06/23 1547       Initial Vocational Rehab Evaluation & Intervention   Assessment shows need for Vocational Rehabilitation No  Education: Education Goals: Education classes will be provided on a weekly basis, covering required topics. Participant will state understanding/return demonstration of topics presented.  Learning Barriers/Preferences:  Learning Barriers/Preferences - 07/06/23 1547       Learning Barriers/Preferences   Learning Barriers Sight   Wears contacts   Learning Preferences None             Education Topics: Hypertension, Hypertension Reduction -Define heart disease and high blood pressure. Discus how high blood pressure affects the body and ways to reduce high blood pressure.   Exercise and Your Heart -Discuss why it is important to exercise, the FITT principles of exercise, normal and abnormal responses to exercise, and how to exercise safely.   Angina -Discuss definition of angina, causes of angina, treatment of angina, and how to decrease risk of having angina.   Cardiac Medications -Review what the following cardiac medications are used for, how they affect the body, and side effects that may occur when taking the medications.  Medications include Aspirin, Beta blockers, calcium  channel blockers, ACE Inhibitors, angiotensin receptor blockers, diuretics, digoxin, and antihyperlipidemics.   Congestive Heart Failure -Discuss the definition of CHF, how to live with CHF, the signs and symptoms of CHF, and how keep track of weight and sodium intake.   Heart Disease and Intimacy -Discus the effect sexual activity has on the heart, how changes occur during intimacy as we age, and safety during sexual activity.   Smoking Cessation / COPD -Discuss different methods to quit smoking, the health benefits of quitting smoking, and the definition of COPD.   Nutrition I: Fats -Discuss the types of  cholesterol, what cholesterol does to the heart, and how cholesterol levels can be controlled.   Nutrition II: Labels -Discuss the different components of food labels and how to read food label   Heart Parts/Heart Disease and PAD -Discuss the anatomy of the heart, the pathway of blood circulation through the heart, and these are affected by heart disease.   Stress I: Signs and Symptoms -Discuss the causes of stress, how stress may lead to anxiety and depression, and ways to limit stress.   Stress II: Relaxation -Discuss different types of relaxation techniques to limit stress.   Warning Signs of Stroke / TIA -Discuss definition of a stroke, what the signs and symptoms are of a stroke, and how to identify when someone is having stroke.   Knowledge Questionnaire Score:  Knowledge Questionnaire Score - 07/20/23 0754       Knowledge Questionnaire Score   Pre Score 23/26             Core Components/Risk Factors/Patient Goals at Admission:  Personal Goals and Risk Factors at Admission - 07/06/23 1547       Core Components/Risk Factors/Patient Goals on Admission    Weight Management Yes    Intervention Weight Management: Develop a combined nutrition and exercise program designed to reach desired caloric intake, while maintaining appropriate intake of nutrient and fiber, sodium and fats, and appropriate energy expenditure required for the weight goal.;Weight Management: Provide education and appropriate resources to help participant work on and attain dietary goals.    Expected Outcomes Short Term: Continue to assess and modify interventions until short term weight is achieved;Long Term: Adherence to nutrition and physical activity/exercise program aimed toward attainment of established weight goal;Understanding of distribution of calorie intake throughout the day with the consumption of 4-5 meals/snacks;Understanding recommendations for meals to include 15-35% energy as protein,  25-35% energy from fat, 35-60%  energy from carbohydrates, less than 200mg  of dietary cholesterol, 20-35 gm of total fiber daily    Lipids Yes    Intervention Provide education and support for participant on nutrition & aerobic/resistive exercise along with prescribed medications to achieve LDL 70mg , HDL >40mg .    Expected Outcomes Short Term: Participant states understanding of desired cholesterol values and is compliant with medications prescribed. Participant is following exercise prescription and nutrition guidelines.;Long Term: Cholesterol controlled with medications as prescribed, with individualized exercise RX and with personalized nutrition plan. Value goals: LDL < 70mg , HDL > 40 mg.    Stress Yes    Intervention Offer individual and/or small group education and counseling on adjustment to heart disease, stress management and health-related lifestyle change. Teach and support self-help strategies.;Refer participants experiencing significant psychosocial distress to appropriate mental health specialists for further evaluation and treatment. When possible, include family members and significant others in education/counseling sessions.    Expected Outcomes Short Term: Participant demonstrates changes in health-related behavior, relaxation and other stress management skills, ability to obtain effective social support, and compliance with psychotropic medications if prescribed.;Long Term: Emotional wellbeing is indicated by absence of clinically significant psychosocial distress or social isolation.             Core Components/Risk Factors/Patient Goals Review:    Core Components/Risk Factors/Patient Goals at Discharge (Final Review):    ITP Comments:  ITP Comments     Row Name 07/06/23 1540           ITP Comments Completed virtual orientation today.  EP evaluation is scheduled for pending date and time as patient needs to re-schedule her in-person appt.  Documentation for diagnosis  can be found in [facility] encounter 06/30/23.                Comments: Patient arrived for 1st visit/orientation/education at 49. Patient was referred to CR by Dr. Mallipeddi due to Stable Angina. During orientation advised patient on arrival and appointment times what to wear, what to do before, during and after exercise. Reviewed attendance and class policy.  Pt is scheduled to return Cardiac Rehab on 08/04/23 at 1500. Pt was advised to come to class 15 minutes before class starts.  Discussed RPE/Dpysnea scales. Patient participated in warm up stretches. Patient was able to complete 6 minute walk test.  Telemetry:NSR with ST segment depression. Patient was measured for the equipment. Discussed equipment safety with patient. Took patient pre-anthropometric measurements. Patient finished visit at 1300.

## 2023-08-04 ENCOUNTER — Encounter (HOSPITAL_COMMUNITY)
Admission: RE | Admit: 2023-08-04 | Discharge: 2023-08-04 | Disposition: A | Discharge status: Home / Self Care | Source: Ambulatory Visit | Attending: Internal Medicine | Admitting: Internal Medicine

## 2023-08-04 ENCOUNTER — Telehealth: Payer: Self-pay

## 2023-08-04 DIAGNOSIS — I2089 Other forms of angina pectoris: Secondary | ICD-10-CM

## 2023-08-04 MED ORDER — RANOLAZINE ER 500 MG PO TB12: 500.0000 mg | ORAL_TABLET | Freq: Two times a day (BID) | ORAL | 5 refills | Status: DC

## 2023-08-04 NOTE — Progress Notes (Signed)
 Incomplete Session Note  Patient Details  Name: Holly Allison MRN: [id number] Date of Birth: 05-26-1965 Referring Provider:   Flowsheet Row CARDIAC REHAB PHASE II ORIENTATION from 08/03/2023 in [facility] CARDIAC REHABILITATION  Referring Provider Mallipeddi, Vishnu       [name] did not complete her rehab session.  Dr. Mallipeddi did not give medical clearance for patient to start rehab today due to ongoing anginal symptoms. We were notified after patient arrived but she did not exercise.

## 2023-08-04 NOTE — Telephone Encounter (Signed)
 Per Dr. Mallipeddi:  Stop Imdur  Start Ranolazine  500 mg BID Let patient know to notify us  if any medication side-effects Hold cardiac rehab until she sees me again Will need to re-evaluate her symptoms on Ranexa   Doug Gehrig, RN at Cardiac Rehab relayed message to patient. Advised her medication will be sent and someone from scheduling will call her with an appointment with Dr. Mallipeddi

## 2023-08-09 ENCOUNTER — Encounter (HOSPITAL_COMMUNITY)

## 2023-08-09 ENCOUNTER — Encounter (HOSPITAL_COMMUNITY): Payer: Self-pay

## 2023-08-09 DIAGNOSIS — I2089 Other forms of angina pectoris: Secondary | ICD-10-CM

## 2023-08-11 ENCOUNTER — Encounter (HOSPITAL_COMMUNITY)

## 2023-08-16 ENCOUNTER — Encounter (HOSPITAL_COMMUNITY)

## 2023-08-18 ENCOUNTER — Encounter (HOSPITAL_COMMUNITY)

## 2023-08-23 ENCOUNTER — Encounter (HOSPITAL_COMMUNITY)

## 2023-08-23 ENCOUNTER — Encounter (HOSPITAL_COMMUNITY): Payer: Self-pay | Admitting: *Deleted

## 2023-08-23 DIAGNOSIS — I2089 Other forms of angina pectoris: Secondary | ICD-10-CM

## 2023-08-25 ENCOUNTER — Encounter (HOSPITAL_COMMUNITY)

## 2023-08-30 ENCOUNTER — Encounter (HOSPITAL_COMMUNITY)

## 2023-08-31 ENCOUNTER — Encounter (HOSPITAL_COMMUNITY): Payer: Self-pay | Admitting: *Deleted

## 2023-08-31 DIAGNOSIS — I2089 Other forms of angina pectoris: Secondary | ICD-10-CM

## 2023-08-31 NOTE — Progress Notes (Signed)
 Cardiac Individual Treatment Plan  Patient Details  Name: Holly Allison MRN: [id number] Date of Birth: 03-Jun-1965 Referring Provider:   Flowsheet Row CARDIAC REHAB PHASE II ORIENTATION from 08/03/2023 in [facility] CARDIAC REHABILITATION  Referring Provider Mallipeddi, Vishnu       Initial Encounter Date:  Flowsheet Row CARDIAC REHAB PHASE II ORIENTATION from 08/03/2023 in [place] CARDIAC REHABILITATION  Date 08/03/23       Visit Diagnosis: Stable angina (HCC)  Patient's Home Medications on Admission:  Current Outpatient Medications:    aspirin EC 81 MG tablet, Take 81 mg by mouth daily. Swallow whole., Disp: , Rfl:    calcium  carbonate (OSCAL) 1500 (600 Ca) MG TABS tablet, Take 600 mg of elemental calcium  by mouth 2 (two) times daily with a meal., Disp: , Rfl:    cetirizine (ZYRTEC) 10 MG tablet, Take 10 mg by mouth daily., Disp: , Rfl:    cholecalciferol (VITAMIN D3) 25 MCG (1000 UNIT) tablet, Take 1,000 Units by mouth 2 (two) times daily., Disp: , Rfl:    citalopram  (CELEXA ) 10 MG tablet, Take 2 tablets (20 mg total) by mouth daily. (Patient not taking: Reported on 07/06/2023), Disp: 60 tablet, Rfl: 0   Evolocumab  (REPATHA  SURECLICK) 140 MG/ML SOAJ, Inject 140 mg into the skin every 14 (fourteen) days. (Patient not taking: Reported on 07/06/2023), Disp: 2 mL, Rfl: 2   ezetimibe (ZETIA) 10 MG tablet, Take 10 mg by mouth daily., Disp: , Rfl:    famotidine  (PEPCID ) 40 MG tablet, Take 40 mg by mouth daily., Disp: , Rfl:    letrozole  (FEMARA ) 2.5 MG tablet, Take 1 tablet (2.5 mg total) by mouth daily. (Patient not taking: Reported on 07/06/2023), Disp: 90 tablet, Rfl: 3   levothyroxine (SYNTHROID) 50 MCG tablet, Take 50 mcg by mouth daily., Disp: , Rfl:    metoprolol  succinate (TOPROL  XL) 25 MG 24 hr tablet, Take 1.5 tablets (37.5 mg total) by mouth daily., Disp: 135 tablet, Rfl: 3   ranolazine  (RANEXA ) 500 MG 12 hr tablet, Take 1 tablet (500 mg total) by mouth 2 (two) times daily.,  Disp: 60 tablet, Rfl: 5   triamcinolone  cream (KENALOG) 0.1 %, SMARTSIG:1 Application Topical 2-3 Times Daily (Patient not taking: Reported on 07/06/2023), Disp: , Rfl:    venlafaxine  XR (EFFEXOR -XR) 37.5 MG 24 hr capsule, TAKE 1 CAPSULE BY MOUTH ONCE DAILY WITH BREAKFAST (Patient not taking: Reported on 07/06/2023), Disp: 30 capsule, Rfl: 0  Past Medical History: Past Medical History:  Diagnosis Date   Angio-edema    Cancer (HCC)    Family history of adverse reaction to anesthesia    Mother    Heart murmur    Since childhood   Pre-diabetes    Patient states she was told she is prediabetic at yearly physical two months ago. Patient states her A1 C was around 5.6   Urticaria     Tobacco Use: Social History   Tobacco Use  Smoking Status Never  Smokeless Tobacco Never    Labs: Review Flowsheet       Latest Ref Rng & Units 08/06/2020  Labs for ITP Cardiac and Pulmonary Rehab  Hemoglobin A1c 4.8 - 5.6 % 5.5     Capillary Blood Glucose: Lab Results  Component Value Date   GLUCAP 124 (H) 08/30/2014     Exercise Target Goals: Exercise Program Goal: Individual exercise prescription set using results from initial 6 min walk test and THRR while considering  patient's activity barriers and safety.   Exercise Prescription  Goal: Starting with aerobic activity 30 plus minutes a day, 3 days per week for initial exercise prescription. Provide home exercise prescription and guidelines that participant acknowledges understanding prior to discharge.  Activity Barriers & Risk Stratification:  Activity Barriers & Cardiac Risk Stratification - 07/06/23 1549       Activity Barriers & Cardiac Risk Stratification   Activity Barriers Chest Pain/Angina    Cardiac Risk Stratification Moderate             6 Minute Walk:  6 Minute Walk     Row Name 08/03/23 1524         6 Minute Walk   Phase Initial     Distance 1140 feet     Walk Time 6 minutes     # of Rest Breaks 0     MPH  2.16     METS 2.74     RPE 12     Perceived Dyspnea  2     VO2 Peak 9.6     Symptoms Yes (comment)     Comments 5/10 burning between shoulder blades     Resting HR 57 bpm     Resting BP 120/66     Resting Oxygen Saturation  96 %     Exercise Oxygen Saturation  during 6 min walk 96 %     Max Ex. HR 78 bpm     Max Ex. BP 122/68     2 Minute Post BP 118/62              Oxygen Initial Assessment:   Oxygen Re-Evaluation:   Oxygen Discharge (Final Oxygen Re-Evaluation):   Initial Exercise Prescription:  Initial Exercise Prescription - 08/03/23 1500       Date of Initial Exercise RX and Referring Provider   Date 08/03/23    Referring Provider Mallipeddi, Vishnu      Treadmill   MPH 1.8    Grade 0    Minutes 15    METs 2.38      REL-XR   Level 1    Speed 60    Minutes 15    METs 1.9      Prescription Details   Frequency (times per week) 2    Duration Progress to 30 minutes of continuous aerobic without signs/symptoms of physical distress      Intensity   THRR 40-80% of Max Heartrate 95-142    Ratings of Perceived Exertion 11-13    Perceived Dyspnea 0-4      Resistance Training   Training Prescription Yes    Weight 4    Reps 10-15             Perform Capillary Blood Glucose checks as needed.  Exercise Prescription Changes:   Exercise Prescription Changes     Row Name 08/03/23 1500             Response to Exercise   Blood Pressure (Admit) 120/66       Blood Pressure (Exercise) 122/68       Blood Pressure (Exit) 118/62       Heart Rate (Admit) 57 bpm       Heart Rate (Exercise) 78 bpm       Heart Rate (Exit) 62 bpm       Oxygen Saturation (Admit) 96 %       Oxygen Saturation (Exercise) 96 %       Oxygen Saturation (Exit) 96 %       Rating of Perceived  Exertion (Exercise) 12       Perceived Dyspnea (Exercise) 2       Symptoms buring in shoulder blands                Exercise Comments:   Exercise Goals and Review:    Exercise Goals     Row Name 08/03/23 1534             Exercise Goals   Increase Physical Activity Yes       Intervention Provide advice, education, support and counseling about physical activity/exercise needs.;Develop an individualized exercise prescription for aerobic and resistive training based on initial evaluation findings, risk stratification, comorbidities and participant's personal goals.       Expected Outcomes Short Term: Attend rehab on a regular basis to increase amount of physical activity.;Long Term: Add in home exercise to make exercise part of routine and to increase amount of physical activity.;Long Term: Exercising regularly at least 3-5 days a week.       Increase Strength and Stamina Yes       Intervention Provide advice, education, support and counseling about physical activity/exercise needs.;Develop an individualized exercise prescription for aerobic and resistive training based on initial evaluation findings, risk stratification, comorbidities and participant's personal goals.       Expected Outcomes Short Term: Perform resistance training exercises routinely during rehab and add in resistance training at home;Short Term: Increase workloads from initial exercise prescription for resistance, speed, and METs.;Long Term: Improve cardiorespiratory fitness, muscular endurance and strength as measured by increased METs and functional capacity ( )       Able to understand and use rate of perceived exertion (RPE) scale Yes       Intervention Provide education and explanation on how to use RPE scale       Expected Outcomes Short Term: Able to use RPE daily in rehab to express subjective intensity level;Long Term:  Able to use RPE to guide intensity level when exercising independently       Knowledge and understanding of Target Heart Rate Range (THRR) Yes       Intervention Provide education and explanation of THRR including how the numbers were predicted and where they are  located for reference       Expected Outcomes Short Term: Able to state/look up THRR;Long Term: Able to use THRR to govern intensity when exercising independently;Short Term: Able to use daily as guideline for intensity in rehab       Able to check pulse independently Yes       Intervention Provide education and demonstration on how to check pulse in carotid and radial arteries.;Review the importance of being able to check your own pulse for safety during independent exercise       Expected Outcomes Long Term: Able to check pulse independently and accurately;Short Term: Able to explain why pulse checking is important during independent exercise       Understanding of Exercise Prescription Yes       Intervention Provide education, explanation, and written materials on patient's individual exercise prescription       Expected Outcomes Short Term: Able to explain program exercise prescription;Long Term: Able to explain home exercise prescription to exercise independently                Exercise Goals Re-Evaluation :    Discharge Exercise Prescription (Final Exercise Prescription Changes):  Exercise Prescription Changes - 08/03/23 1500       Response to Exercise   Blood Pressure (  Admit) 120/66    Blood Pressure (Exercise) 122/68    Blood Pressure (Exit) 118/62    Heart Rate (Admit) 57 bpm    Heart Rate (Exercise) 78 bpm    Heart Rate (Exit) 62 bpm    Oxygen Saturation (Admit) 96 %    Oxygen Saturation (Exercise) 96 %    Oxygen Saturation (Exit) 96 %    Rating of Perceived Exertion (Exercise) 12    Perceived Dyspnea (Exercise) 2    Symptoms buring in shoulder blands             Nutrition:  Target Goals: Understanding of nutrition guidelines, daily intake of sodium 1500mg , cholesterol 200mg , calories 30% from fat and 7% or less from saturated fats, daily to have 5 or more servings of fruits and vegetables.  Biometrics:  Pre Biometrics - 08/03/23 1535       Pre Biometrics    Height 5\' 4"  (1.626 m)    Waist Circumference 38.5 inches    Hip Circumference 44 inches    Waist to Hip Ratio 0.88 %    Grip Strength 23.4 kg    Single Leg Stand 25.46 seconds              Nutrition Therapy Plan and Nutrition Goals:  Nutrition Therapy & Goals - 07/06/23 1547       Intervention Plan   Intervention Prescribe, educate and counsel regarding individualized specific dietary modifications aiming towards targeted core components such as weight, hypertension, lipid management, diabetes, heart failure and other comorbidities.;Nutrition handout(s) given to patient.    Expected Outcomes Short Term Goal: Understand basic principles of dietary content, such as calories, fat, sodium, cholesterol and nutrients.;Long Term Goal: Adherence to prescribed nutrition plan.;Short Term Goal: A plan has been developed with personal nutrition goals set during dietitian appointment.             Nutrition Assessments:  MEDIFICTS Score Key: >=70 Need to make dietary changes  40-70 Heart Healthy Diet <= 40 Therapeutic Level Cholesterol Diet  Flowsheet Row CARDIAC VIRTUAL BASED CARE from 07/06/2023 in [facility] CARDIAC REHABILITATION  Picture Your Plate Total Score on Admission 50      Picture Your Plate Scores: <16 Unhealthy dietary pattern with much room for improvement. 41-50 Dietary pattern unlikely to meet recommendations for good health and room for improvement. 51-60 More healthful dietary pattern, with some room for improvement.  >60 Healthy dietary pattern, although there may be some specific behaviors that could be improved.    Nutrition Goals Re-Evaluation:   Nutrition Goals Discharge (Final Nutrition Goals Re-Evaluation):   Psychosocial: Target Goals: Acknowledge presence or absence of significant depression and/or stress, maximize coping skills, provide positive support system. Participant is able to verbalize types and ability to use techniques and skills  needed for reducing stress and depression.  Initial Review & Psychosocial Screening:  Initial Psych Review & Screening - 07/06/23 1533       Initial Review   Current issues with Current Stress Concerns    Source of Stress Concerns Chronic Illness;Family    Comments Stressed about her health and her husbands health.      Family Dynamics   Good Support System? Yes    Comments Patient has her spouse and her daughter as support system.      Barriers   Psychosocial barriers to participate in program The patient should benefit from training in stress management and relaxation.      Screening Interventions   Interventions Encouraged to exercise;To  provide support and resources with identified psychosocial needs    Expected Outcomes Short Term goal: Utilizing psychosocial counselor, staff and physician to assist with identification of specific Stressors or current issues interfering with healing process. Setting desired goal for each stressor or current issue identified.;Short Term goal: Identification and review with participant of any Quality of Life or Depression concerns found by scoring the questionnaire.;Long Term Goal: Stressors or current issues are controlled or eliminated.;Long Term goal: The participant improves quality of Life and PHQ9 Scores as seen by post scores and/or verbalization of changes             Quality of Life Scores:  Quality of Life - 07/20/23 0755       Quality of Life   Select Quality of Life      Quality of Life Scores   Health/Function Pre 20 %    Socioeconomic Pre 21.44 %    Psych/Spiritual Pre 23.36 %    Family Pre 23.5 %    GLOBAL Pre 21.5 %            Scores of 19 and below usually indicate a poorer quality of life in these areas.  A difference of  2-3 points is a clinically meaningful difference.  A difference of 2-3 points in the total score of the Quality of Life Index has been associated with significant improvement in overall quality of  life, self-image, physical symptoms, and general health in studies assessing change in quality of life.  PHQ-9: Review Flowsheet       08/03/2023  Depression screen PHQ 2/9  Decreased Interest 0  Down, Depressed, Hopeless 0  PHQ - 2 Score 0  Altered sleeping 0  Tired, decreased energy 3  Change in appetite 0  Feeling bad or failure about yourself  0  Trouble concentrating 1  Moving slowly or fidgety/restless 0  Suicidal thoughts 0  PHQ-9 Score 4   Interpretation of Total Score  Total Score Depression Severity:  1-4 = Minimal depression, 5-9 = Mild depression, 10-14 = Moderate depression, 15-19 = Moderately severe depression, 20-27 = Severe depression   Psychosocial Evaluation and Intervention:  Psychosocial Evaluation - 07/06/23 1540       Psychosocial Evaluation & Interventions   Interventions Stress management education;Relaxation education;Encouraged to exercise with the program and follow exercise prescription    Comments Carmelina Chinchilla is a pleasant young woman who is coming to rehab for her stable angina. 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 She states the chest pain isn't constant, but only when she exerts herself. She has a good support system from her husband and daughter, which her daughter is a CNA here at the hospital. Carmelina Chinchilla does have some stressors in her life with her health and her husband's issues as he has been sick the last few months. She currently does not do any exercise at home, but is looking forward to starting exercise here.    Expected Outcomes Short: Attend Rehab regularly. Long: Work on stress reduction techniques.    Continue Psychosocial Services  Follow up required by staff             Psychosocial Re-Evaluation:   Psychosocial Discharge (Final Psychosocial Re-Evaluation):   Vocational Rehabilitation: Provide vocational rehab assistance to qualifying candidates.   Vocational Rehab Evaluation &  Intervention:  Vocational Rehab - 07/06/23 1547       Initial Vocational Rehab Evaluation & Intervention   Assessment shows need for Vocational  Rehabilitation No             Education: Education Goals: Education classes will be provided on a weekly basis, covering required topics. Participant will state understanding/return demonstration of topics presented.  Learning Barriers/Preferences:  Learning Barriers/Preferences - 07/06/23 1547       Learning Barriers/Preferences   Learning Barriers Sight   Wears contacts   Learning Preferences None             Education Topics: Hypertension, Hypertension Reduction -Define heart disease and high blood pressure. Discus how high blood pressure affects the body and ways to reduce high blood pressure.   Exercise and Your Heart -Discuss why it is important to exercise, the FITT principles of exercise, normal and abnormal responses to exercise, and how to exercise safely.   Angina -Discuss definition of angina, causes of angina, treatment of angina, and how to decrease risk of having angina.   Cardiac Medications -Review what the following cardiac medications are used for, how they affect the body, and side effects that may occur when taking the medications.  Medications include Aspirin, Beta blockers, calcium  channel blockers, ACE Inhibitors, angiotensin receptor blockers, diuretics, digoxin, and antihyperlipidemics.   Congestive Heart Failure -Discuss the definition of CHF, how to live with CHF, the signs and symptoms of CHF, and how keep track of weight and sodium intake.   Heart Disease and Intimacy -Discus the effect sexual activity has on the heart, how changes occur during intimacy as we age, and safety during sexual activity.   Smoking Cessation / COPD -Discuss different methods to quit smoking, the health benefits of quitting smoking, and the definition of COPD.   Nutrition I: Fats -Discuss the types of  cholesterol, what cholesterol does to the heart, and how cholesterol levels can be controlled.   Nutrition II: Labels -Discuss the different components of food labels and how to read food label   Heart Parts/Heart Disease and PAD -Discuss the anatomy of the heart, the pathway of blood circulation through the heart, and these are affected by heart disease.   Stress I: Signs and Symptoms -Discuss the causes of stress, how stress may lead to anxiety and depression, and ways to limit stress.   Stress II: Relaxation -Discuss different types of relaxation techniques to limit stress.   Warning Signs of Stroke / TIA -Discuss definition of a stroke, what the signs and symptoms are of a stroke, and how to identify when someone is having stroke.   Knowledge Questionnaire Score:  Knowledge Questionnaire Score - 07/20/23 0754       Knowledge Questionnaire Score   Pre Score 23/26             Core Components/Risk Factors/Patient Goals at Admission:  Personal Goals and Risk Factors at Admission - 07/06/23 1547       Core Components/Risk Factors/Patient Goals on Admission    Weight Management Yes    Intervention Weight Management: Develop a combined nutrition and exercise program designed to reach desired caloric intake, while maintaining appropriate intake of nutrient and fiber, sodium and fats, and appropriate energy expenditure required for the weight goal.;Weight Management: Provide education and appropriate resources to help participant work on and attain dietary goals.    Expected Outcomes Short Term: Continue to assess and modify interventions until short term weight is achieved;Long Term: Adherence to nutrition and physical activity/exercise program aimed toward attainment of established weight goal;Understanding of distribution of calorie intake throughout the day with the consumption of 4-5 meals/snacks;Understanding recommendations  for meals to include 15-35% energy as protein,  25-35% energy from fat, 35-60% energy from carbohydrates, less than 200mg  of dietary cholesterol, 20-35 gm of total fiber daily    Lipids Yes    Intervention Provide education and support for participant on nutrition & aerobic/resistive exercise along with prescribed medications to achieve LDL 70mg , HDL >40mg .    Expected Outcomes Short Term: Participant states understanding of desired cholesterol values and is compliant with medications prescribed. Participant is following exercise prescription and nutrition guidelines.;Long Term: Cholesterol controlled with medications as prescribed, with individualized exercise RX and with personalized nutrition plan. Value goals: LDL < 70mg , HDL > 40 mg.    Stress Yes    Intervention Offer individual and/or small group education and counseling on adjustment to heart disease, stress management and health-related lifestyle change. Teach and support self-help strategies.;Refer participants experiencing significant psychosocial distress to appropriate mental health specialists for further evaluation and treatment. When possible, include family members and significant others in education/counseling sessions.    Expected Outcomes Short Term: Participant demonstrates changes in health-related behavior, relaxation and other stress management skills, ability to obtain effective social support, and compliance with psychotropic medications if prescribed.;Long Term: Emotional wellbeing is indicated by absence of clinically significant psychosocial distress or social isolation.             Core Components/Risk Factors/Patient Goals Review:    Core Components/Risk Factors/Patient Goals at Discharge (Final Review):    ITP Comments:  ITP Comments     Row Name 07/06/23 1540 08/04/23 1524 08/09/23 0812 08/23/23 1528 08/31/23 1157   ITP Comments Completed virtual orientation today.  EP evaluation is scheduled for pending date and time as patient needs to re-schedule her  in-person appt.  Documentation for diagnosis can be found in [facility] encounter 06/30/23. Patient arrived for 1st visit/orientation/education at 1415. Patient was referred to CR by Dr. Mallipeddi due to Stable Angina. During orientation advised patient on arrival and appointment times what to wear, what to do before, during and after exercise. Reviewed attendance and class policy.  Pt is scheduled to return Cardiac Rehab on 08/04/23 at 1500. Pt was advised to come to class 15 minutes before class starts.  Discussed RPE/Dpysnea scales. Patient participated in warm up stretches. Patient was able to complete 6 minute walk test.  Telemetry:NSR with ST segment depression. Patient was measured for the equipment. Discussed equipment safety with patient. Took patient pre-anthropometric measurements. Patient finished visit at 1300. Patient is on medical hold while medications are being updated. SHe has a follow up on 09/21/2023 and will see if she is cleared after follow up to resume cardiac rehab Continue to wait for clearance for after appt on 09/21/23 30 day review completed. ITP sent to Dr. Armida Lander, Medical Director of Cardiac Rehab. Continue with ITP unless changes are made by physician.  Medical hold until f/u appt 09/21/23            Comments: 30 day review

## 2023-09-01 ENCOUNTER — Encounter (HOSPITAL_COMMUNITY)

## 2023-09-06 ENCOUNTER — Encounter (HOSPITAL_COMMUNITY)

## 2023-09-08 ENCOUNTER — Encounter (HOSPITAL_COMMUNITY)

## 2023-09-13 ENCOUNTER — Encounter (HOSPITAL_COMMUNITY)

## 2023-09-14 DIAGNOSIS — R739 Hyperglycemia, unspecified: Secondary | ICD-10-CM | POA: Diagnosis not present

## 2023-09-14 DIAGNOSIS — E039 Hypothyroidism, unspecified: Secondary | ICD-10-CM | POA: Diagnosis not present

## 2023-09-14 DIAGNOSIS — I2510 Atherosclerotic heart disease of native coronary artery without angina pectoris: Secondary | ICD-10-CM | POA: Diagnosis not present

## 2023-09-14 DIAGNOSIS — E66811 Obesity, class 1: Secondary | ICD-10-CM | POA: Diagnosis not present

## 2023-09-14 DIAGNOSIS — Z6832 Body mass index (BMI) 32.0-32.9, adult: Secondary | ICD-10-CM | POA: Diagnosis not present

## 2023-09-15 ENCOUNTER — Encounter (HOSPITAL_COMMUNITY)

## 2023-09-19 DIAGNOSIS — R5383 Other fatigue: Secondary | ICD-10-CM | POA: Diagnosis not present

## 2023-09-19 DIAGNOSIS — E782 Mixed hyperlipidemia: Secondary | ICD-10-CM | POA: Diagnosis not present

## 2023-09-19 DIAGNOSIS — R739 Hyperglycemia, unspecified: Secondary | ICD-10-CM | POA: Diagnosis not present

## 2023-09-19 DIAGNOSIS — E559 Vitamin D deficiency, unspecified: Secondary | ICD-10-CM | POA: Diagnosis not present

## 2023-09-20 ENCOUNTER — Encounter (HOSPITAL_COMMUNITY)

## 2023-09-21 ENCOUNTER — Encounter: Payer: Self-pay | Admitting: Internal Medicine

## 2023-09-21 ENCOUNTER — Telehealth (HOSPITAL_COMMUNITY): Payer: Self-pay | Admitting: *Deleted

## 2023-09-21 ENCOUNTER — Ambulatory Visit: Attending: Internal Medicine | Admitting: Internal Medicine

## 2023-09-21 VITALS — BP 110/74 | HR 58 | Ht 64.0 in | Wt 189.0 lb

## 2023-09-21 DIAGNOSIS — I959 Hypotension, unspecified: Secondary | ICD-10-CM

## 2023-09-21 NOTE — Telephone Encounter (Signed)
-----   Message from Vishnu P Mallipeddi sent at 09/21/2023 11:55 AM EDT ----- Holly Allison to resume cardiac rehab

## 2023-09-21 NOTE — Patient Instructions (Signed)
 Medication Instructions:  Your physician has recommended you make the following change in your medication:  Stop taking Zetia  Continue taking all other medications as prescribed  Labwork: None  Testing/Procedures: None  Follow-Up: Your physician recommends that you schedule a follow-up appointment in: 6 months  Any Other Special Instructions Will Be Listed Below (If Applicable). Thank you for choosing [facility]!     If you need a refill on your cardiac medications before your next appointment, please call your pharmacy.

## 2023-09-21 NOTE — Progress Notes (Signed)
 Cardiology Office Note  Date: 09/21/2023   ID: Holly Allison, DOB 13-Sep-1965, MRN [id number]  PCP:  Orest Bio, MD  Cardiologist:  Lasalle Pointer, MD Electrophysiologist:  None   History of Present Illness: Holly Allison is a 57 y.o. female known to have moderate LAD disease with stable angina, HLD is here for follow-up visit.  Patient started to have exertional substernal chest discomfort associated with burning pain in between her shoulder blades and palpitations, lasting between 5 and 10 minutes and resolves with rest.  Frequency 1-2 times per week.  No recent worsening.  She underwent LHC at [facility] in November 2024 that showed 50 to 60% mid LAD stenosis.  FFR was not performed at that time and the plan was to optimize medical management and perform FFR/PCI if chest pains were refractory to medical management.  Echocardiogram in November 2024 that showed normal LVEF and no valvular heart disease.  She also underwent event monitor on the same plan that showed no evidence of malignant arrhythmias or conduction abnormalities.  Overall, unremarkable event monitor and echocardiogram.  She does not have any strong family history of CAD.  Although her mother and father had history of strokes.  She does not have any personal cardiac risk factors including HTN, DM 2, smoking etc.  She works as a Water quality scientist at [employer].  She does not have any other symptoms of dizziness, syncope, leg swelling DOE.  I saw her in March 2025 when I started her on Imdur  which she did not tolerate due to headaches and the metoprolol  dose was increased to 37.5 mg once daily. Since she did not tolerate Imdur , ranolazine  500 mg twice daily was started after she did not have any more angina.  She is currently walking for 2 miles.  Before Ranexa  was started, she was going to do cardiac rehab and started to have angina with exertion.  Cardiac rehab had to be put on hold until she got clearance from me.  Currently on  Repatha , statin was discontinued.  I reviewed her lipid panels, LDL 24.  TG less than 150.  Past Medical History:  Diagnosis Date   Angio-edema    Cancer [facility])    Family history of adverse reaction to anesthesia    Mother    Heart murmur    Since childhood   Pre-diabetes    Patient states she was told she is prediabetic at yearly physical two months ago. Patient states her A1 C was around 5.6   Urticaria     Past Surgical History:  Procedure Laterality Date   AXILLARY SENTINEL NODE BIOPSY Left 08/14/2020   Procedure: LEFT AXILLARY SENTINEL NODE BIOPSY;  Surgeon: Enid Harry, MD;  Location: [facility] OR;  Service: General;  Laterality: Left;   BREAST LUMPECTOMY WITH RADIOACTIVE SEED AND SENTINEL LYMPH NODE BIOPSY Left 08/14/2020   Procedure: LEFT BREAST LUMPECTOMY WITH RADIOACTIVE SEED;  Surgeon: Enid Harry, MD;  Location: [facility] OR;  Service: General;  Laterality: Left;   CESAREAN SECTION     TONSILLECTOMY      Current Outpatient Medications  Medication Sig Dispense Refill   aspirin EC 81 MG tablet Take 81 mg by mouth daily. Swallow whole.     calcium  carbonate (OSCAL) 1500 (600 Ca) MG TABS tablet Take 600 mg of elemental calcium  by mouth 2 (two) times daily with a meal.     cetirizine (ZYRTEC) 10 MG tablet Take 10 mg by mouth daily.     cholecalciferol (VITAMIN  D3) 25 MCG (1000 UNIT) tablet Take 1,000 Units by mouth 2 (two) times daily.     Evolocumab  (REPATHA  SURECLICK) 140 MG/ML SOAJ Inject 140 mg into the skin every 14 (fourteen) days. 2 mL 2   ezetimibe (ZETIA) 10 MG tablet Take 10 mg by mouth daily.     famotidine  (PEPCID ) 40 MG tablet Take 40 mg by mouth daily.     letrozole  (FEMARA ) 2.5 MG tablet Take 1 tablet (2.5 mg total) by mouth daily. 90 tablet 3   levothyroxine (SYNTHROID) 50 MCG tablet Take 50 mcg by mouth daily.     metoprolol  succinate (TOPROL  XL) 25 MG 24 hr tablet Take 1.5 tablets (37.5 mg total) by mouth daily. 135 tablet 3   ranolazine  (RANEXA ) 500 MG 12 hr  tablet Take 1 tablet (500 mg total) by mouth 2 (two) times daily. 60 tablet 5   triamcinolone  cream (KENALOG) 0.1 %      No current facility-administered medications for this visit.   Allergies:  Patient has no known allergies.   Social History: The patient  reports that she has never smoked. She has never used smokeless tobacco. She reports that she does not drink alcohol and does not use drugs.   Family History: The patient's family history is not on file.   ROS:  Please see the history of present illness. Otherwise, complete review of systems is positive for none  All other systems are reviewed and negative.   Physical Exam: VS:  BP 110/74   Pulse (!) 58   Ht 5' 4 (1.626 m)   Wt 189 lb (85.7 kg)   LMP 07/07/2014   SpO2 97%   BMI 32.44 kg/m , BMI Body mass index is 32.44 kg/m.  Wt Readings from Last 3 Encounters:  09/21/23 189 lb (85.7 kg)  06/30/23 192 lb 12.8 oz (87.5 kg)  05/05/22 193 lb 9.6 oz (87.8 kg)    General: Patient appears comfortable at rest. HEENT: Conjunctiva and lids normal, oropharynx clear with moist mucosa. Neck: Supple, no elevated JVP or carotid bruits, no thyromegaly. Lungs: Clear to auscultation, nonlabored breathing at rest. Cardiac: Regular rate and rhythm, no S3 or significant systolic murmur, no pericardial rub. Abdomen: Soft, nontender, no hepatomegaly, bowel sounds present, no guarding or rebound. Extremities: No pitting edema, distal pulses 2+. Skin: Warm and dry. Musculoskeletal: No kyphosis. Neuropsychiatric: Alert and oriented x3, affect grossly appropriate.  Recent Labwork: No results found for requested labs within last 365 days.  No results found for: CHOL, TRIG, HDL, CHOLHDL, VLDL, LDLCALC, LDLDIRECT    Assessment and Plan:  CAD manifested by stable angina s/p moderate obstructive CAD in the mid LAD (50 to 60% stenosis): No angina after starting Ranexa .  Continue ranolazine  500 mg twice daily, continue metoprolol   succinate 37.5 mg once daily.  Did not tolerate Imdur  due to headaches.  Currently walking 2 miles per day with no symptoms of angina.  Okay to resume cardiac rehab.  Continue aspirin 81 mg once daily and Repatha .  Statin was discontinued in the last clinic visit, LDL 24 on the most recent blood work after starting Repatha .  Will drop Zetia as well.  HLD, at goal: LDL was 149 in October 2024, after starting Repatha  and being on Zetia, her LDL dropped to 24.  TG 150.  Will stop Zetia.  Continue Repatha  140 mg every 2 weeks.  Does not have a family history of premature CAD.      Medication Adjustments/Labs and Tests Ordered: Current  medicines are reviewed at length with the patient today.  Concerns regarding medicines are outlined above.    Disposition:  Follow up 6 months  Signed Hank Walling Priya Shaima Sardinas, MD, 09/21/2023 10:47 AM    [facility] [address] [place], [place], [place] [address]

## 2023-09-22 ENCOUNTER — Encounter (HOSPITAL_COMMUNITY)

## 2023-09-22 ENCOUNTER — Other Ambulatory Visit: Payer: Self-pay | Admitting: Internal Medicine

## 2023-09-22 DIAGNOSIS — I25118 Atherosclerotic heart disease of native coronary artery with other forms of angina pectoris: Secondary | ICD-10-CM

## 2023-09-22 DIAGNOSIS — E785 Hyperlipidemia, unspecified: Secondary | ICD-10-CM

## 2023-09-27 ENCOUNTER — Encounter (HOSPITAL_COMMUNITY)
Admission: RE | Admit: 2023-09-27 | Discharge: 2023-09-27 | Disposition: A | Discharge status: Home / Self Care | Source: Ambulatory Visit | Attending: Internal Medicine | Admitting: Internal Medicine

## 2023-09-27 DIAGNOSIS — I2089 Other forms of angina pectoris: Secondary | ICD-10-CM | POA: Insufficient documentation

## 2023-09-28 NOTE — Progress Notes (Signed)
 Cardiac Individual Treatment Plan  Patient Details  Name: Holly Allison MRN: [id number] Date of Birth: 1966/04/11 Referring Provider:   Flowsheet Row CARDIAC REHAB PHASE II ORIENTATION from 08/03/2023 in [facility] CARDIAC REHABILITATION  Referring Provider Mallipeddi, Vishnu    Initial Encounter Date:  Flowsheet Row CARDIAC REHAB PHASE II ORIENTATION from 08/03/2023 in [place] CARDIAC REHABILITATION  Date 08/03/23    Visit Diagnosis: No diagnosis found.  Patient's Home Medications on Admission:  Current Outpatient Medications:    aspirin EC 81 MG tablet, Take 81 mg by mouth daily. Swallow whole., Disp: , Rfl:    calcium  carbonate (OSCAL) 1500 (600 Ca) MG TABS tablet, Take 600 mg of elemental calcium  by mouth 2 (two) times daily with a meal., Disp: , Rfl:    cetirizine (ZYRTEC) 10 MG tablet, Take 10 mg by mouth daily., Disp: , Rfl:    cholecalciferol (VITAMIN D3) 25 MCG (1000 UNIT) tablet, Take 1,000 Units by mouth 2 (two) times daily., Disp: , Rfl:    Evolocumab  (REPATHA  SURECLICK) 140 MG/ML SOAJ, INJECT 140MG  INTO THE SKIN EVERY 14 DAYS, Disp: 6 mL, Rfl: 1   famotidine  (PEPCID ) 40 MG tablet, Take 40 mg by mouth daily., Disp: , Rfl:    letrozole  (FEMARA ) 2.5 MG tablet, Take 1 tablet (2.5 mg total) by mouth daily., Disp: 90 tablet, Rfl: 3   levothyroxine (SYNTHROID) 50 MCG tablet, Take 50 mcg by mouth daily., Disp: , Rfl:    metoprolol  succinate (TOPROL  XL) 25 MG 24 hr tablet, Take 1.5 tablets (37.5 mg total) by mouth daily., Disp: 135 tablet, Rfl: 3   ranolazine  (RANEXA ) 500 MG 12 hr tablet, Take 1 tablet (500 mg total) by mouth 2 (two) times daily., Disp: 60 tablet, Rfl: 5   triamcinolone  cream (KENALOG) 0.1 %, , Disp: , Rfl:   Past Medical History: Past Medical History:  Diagnosis Date   Angio-edema    Cancer (HCC)    Family history of adverse reaction to anesthesia    Mother    Heart murmur    Since childhood   Pre-diabetes    Patient states she was told she is  prediabetic at yearly physical two months ago. Patient states her A1 C was around 5.6   Urticaria     Tobacco Use: Social History   Tobacco Use  Smoking Status Never  Smokeless Tobacco Never    Labs: Review Flowsheet       Latest Ref Rng & Units 08/06/2020  Labs for ITP Cardiac and Pulmonary Rehab  Hemoglobin A1c 4.8 - 5.6 % 5.5     Capillary Blood Glucose: Lab Results  Component Value Date   GLUCAP 124 (H) 08/30/2014     Exercise Target Goals: Exercise Program Goal: Individual exercise prescription set using results from initial 6 min walk test and THRR while considering  patient's activity barriers and safety.   Exercise Prescription Goal: Starting with aerobic activity 30 plus minutes a day, 3 days per week for initial exercise prescription. Provide home exercise prescription and guidelines that participant acknowledges understanding prior to discharge.  Activity Barriers & Risk Stratification:  Activity Barriers & Cardiac Risk Stratification - 07/06/23 1549       Activity Barriers & Cardiac Risk Stratification   Activity Barriers Chest Pain/Angina    Cardiac Risk Stratification Moderate          6 Minute Walk:  6 Minute Walk     Row Name 08/03/23 1524         6  Minute Walk   Phase Initial     Distance 1140 feet     Walk Time 6 minutes     # of Rest Breaks 0     MPH 2.16     METS 2.74     RPE 12     Perceived Dyspnea  2     VO2 Peak 9.6     Symptoms Yes (comment)     Comments 5/10 burning between shoulder blades     Resting HR 57 bpm     Resting BP 120/66     Resting Oxygen Saturation  96 %     Exercise Oxygen Saturation  during 6 min walk 96 %     Max Ex. HR 78 bpm     Max Ex. BP 122/68     2 Minute Post BP 118/62        Oxygen Initial Assessment:   Oxygen Re-Evaluation:   Oxygen Discharge (Final Oxygen Re-Evaluation):   Initial Exercise Prescription:  Initial Exercise Prescription - 08/03/23 1500       Date of Initial  Exercise RX and Referring Provider   Date 08/03/23    Referring Provider Mallipeddi, Vishnu      Treadmill   MPH 1.8    Grade 0    Minutes 15    METs 2.38      REL-XR   Level 1    Speed 60    Minutes 15    METs 1.9      Prescription Details   Frequency (times per week) 2    Duration Progress to 30 minutes of continuous aerobic without signs/symptoms of physical distress      Intensity   THRR 40-80% of Max Heartrate 95-142    Ratings of Perceived Exertion 11-13    Perceived Dyspnea 0-4      Resistance Training   Training Prescription Yes    Weight 4    Reps 10-15          Perform Capillary Blood Glucose checks as needed.  Exercise Prescription Changes:   Exercise Prescription Changes     Row Name 08/03/23 1500             Response to Exercise   Blood Pressure (Admit) 120/66       Blood Pressure (Exercise) 122/68       Blood Pressure (Exit) 118/62       Heart Rate (Admit) 57 bpm       Heart Rate (Exercise) 78 bpm       Heart Rate (Exit) 62 bpm       Oxygen Saturation (Admit) 96 %       Oxygen Saturation (Exercise) 96 %       Oxygen Saturation (Exit) 96 %       Rating of Perceived Exertion (Exercise) 12       Perceived Dyspnea (Exercise) 2       Symptoms buring in shoulder blands          Exercise Comments:   Exercise Goals and Review:   Exercise Goals     Row Name 08/03/23 1534             Exercise Goals   Increase Physical Activity Yes       Intervention Provide advice, education, support and counseling about physical activity/exercise needs.;Develop an individualized exercise prescription for aerobic and resistive training based on initial evaluation findings, risk stratification, comorbidities and participant's personal goals.  Expected Outcomes Short Term: Attend rehab on a regular basis to increase amount of physical activity.;Long Term: Add in home exercise to make exercise part of routine and to increase amount of physical  activity.;Long Term: Exercising regularly at least 3-5 days a week.       Increase Strength and Stamina Yes       Intervention Provide advice, education, support and counseling about physical activity/exercise needs.;Develop an individualized exercise prescription for aerobic and resistive training based on initial evaluation findings, risk stratification, comorbidities and participant's personal goals.       Expected Outcomes Short Term: Perform resistance training exercises routinely during rehab and add in resistance training at home;Short Term: Increase workloads from initial exercise prescription for resistance, speed, and METs.;Long Term: Improve cardiorespiratory fitness, muscular endurance and strength as measured by increased METs and functional capacity ( )       Able to understand and use rate of perceived exertion (RPE) scale Yes       Intervention Provide education and explanation on how to use RPE scale       Expected Outcomes Short Term: Able to use RPE daily in rehab to express subjective intensity level;Long Term:  Able to use RPE to guide intensity level when exercising independently       Knowledge and understanding of Target Heart Rate Range (THRR) Yes       Intervention Provide education and explanation of THRR including how the numbers were predicted and where they are located for reference       Expected Outcomes Short Term: Able to state/look up THRR;Long Term: Able to use THRR to govern intensity when exercising independently;Short Term: Able to use daily as guideline for intensity in rehab       Able to check pulse independently Yes       Intervention Provide education and demonstration on how to check pulse in carotid and radial arteries.;Review the importance of being able to check your own pulse for safety during independent exercise       Expected Outcomes Long Term: Able to check pulse independently and accurately;Short Term: Able to explain why pulse checking is  important during independent exercise       Understanding of Exercise Prescription Yes       Intervention Provide education, explanation, and written materials on patient's individual exercise prescription       Expected Outcomes Short Term: Able to explain program exercise prescription;Long Term: Able to explain home exercise prescription to exercise independently          Exercise Goals Re-Evaluation :    Discharge Exercise Prescription (Final Exercise Prescription Changes):  Exercise Prescription Changes - 08/03/23 1500       Response to Exercise   Blood Pressure (Admit) 120/66    Blood Pressure (Exercise) 122/68    Blood Pressure (Exit) 118/62    Heart Rate (Admit) 57 bpm    Heart Rate (Exercise) 78 bpm    Heart Rate (Exit) 62 bpm    Oxygen Saturation (Admit) 96 %    Oxygen Saturation (Exercise) 96 %    Oxygen Saturation (Exit) 96 %    Rating of Perceived Exertion (Exercise) 12    Perceived Dyspnea (Exercise) 2    Symptoms buring in shoulder blands          Nutrition:  Target Goals: Understanding of nutrition guidelines, daily intake of sodium 1500mg , cholesterol 200mg , calories 30% from fat and 7% or less from saturated fats, daily to have 5 or more  servings of fruits and vegetables.  Biometrics:  Pre Biometrics - 08/03/23 1535       Pre Biometrics   Height 5' 4 (1.626 m)    Waist Circumference 38.5 inches    Hip Circumference 44 inches    Waist to Hip Ratio 0.88 %    Grip Strength 23.4 kg    Single Leg Stand 25.46 seconds           Nutrition Therapy Plan and Nutrition Goals:  Nutrition Therapy & Goals - 07/06/23 1547       Intervention Plan   Intervention Prescribe, educate and counsel regarding individualized specific dietary modifications aiming towards targeted core components such as weight, hypertension, lipid management, diabetes, heart failure and other comorbidities.;Nutrition handout(s) given to patient.    Expected Outcomes Short Term  Goal: Understand basic principles of dietary content, such as calories, fat, sodium, cholesterol and nutrients.;Long Term Goal: Adherence to prescribed nutrition plan.;Short Term Goal: A plan has been developed with personal nutrition goals set during dietitian appointment.          Nutrition Assessments:  MEDIFICTS Score Key: >=70 Need to make dietary changes  40-70 Heart Healthy Diet <= 40 Therapeutic Level Cholesterol Diet  Flowsheet Row CARDIAC VIRTUAL BASED CARE from 07/06/2023 in [facility] CARDIAC REHABILITATION  Picture Your Plate Total Score on Admission 50   Picture Your Plate Scores: <65 Unhealthy dietary pattern with much room for improvement. 41-50 Dietary pattern unlikely to meet recommendations for good health and room for improvement. 51-60 More healthful dietary pattern, with some room for improvement.  >60 Healthy dietary pattern, although there may be some specific behaviors that could be improved.    Nutrition Goals Re-Evaluation:   Nutrition Goals Discharge (Final Nutrition Goals Re-Evaluation):   Psychosocial: Target Goals: Acknowledge presence or absence of significant depression and/or stress, maximize coping skills, provide positive support system. Participant is able to verbalize types and ability to use techniques and skills needed for reducing stress and depression.  Initial Review & Psychosocial Screening:  Initial Psych Review & Screening - 07/06/23 1533       Initial Review   Current issues with Current Stress Concerns    Source of Stress Concerns Chronic Illness;Family    Comments Stressed about her health and her husbands health.      Family Dynamics   Good Support System? Yes    Comments Patient has her spouse and her daughter as support system.      Barriers   Psychosocial barriers to participate in program The patient should benefit from training in stress management and relaxation.      Screening Interventions   Interventions  Encouraged to exercise;To provide support and resources with identified psychosocial needs    Expected Outcomes Short Term goal: Utilizing psychosocial counselor, staff and physician to assist with identification of specific Stressors or current issues interfering with healing process. Setting desired goal for each stressor or current issue identified.;Short Term goal: Identification and review with participant of any Quality of Life or Depression concerns found by scoring the questionnaire.;Long Term Goal: Stressors or current issues are controlled or eliminated.;Long Term goal: The participant improves quality of Life and PHQ9 Scores as seen by post scores and/or verbalization of changes          Quality of Life Scores:  Quality of Life - 07/20/23 0755       Quality of Life   Select Quality of Life      Quality of Life Scores  Health/Function Pre 20 %    Socioeconomic Pre 21.44 %    Psych/Spiritual Pre 23.36 %    Family Pre 23.5 %    GLOBAL Pre 21.5 %         Scores of 19 and below usually indicate a poorer quality of life in these areas.  A difference of  2-3 points is a clinically meaningful difference.  A difference of 2-3 points in the total score of the Quality of Life Index has been associated with significant improvement in overall quality of life, self-image, physical symptoms, and general health in studies assessing change in quality of life.  PHQ-9: Review Flowsheet       08/03/2023  Depression screen PHQ 2/9  Decreased Interest 0  Down, Depressed, Hopeless 0  PHQ - 2 Score 0  Altered sleeping 0  Tired, decreased energy 3  Change in appetite 0  Feeling bad or failure about yourself  0  Trouble concentrating 1  Moving slowly or fidgety/restless 0  Suicidal thoughts 0  PHQ-9 Score 4   Interpretation of Total Score  Total Score Depression Severity:  1-4 = Minimal depression, 5-9 = Mild depression, 10-14 = Moderate depression, 15-19 = Moderately severe  depression, 20-27 = Severe depression   Psychosocial Evaluation and Intervention:  Psychosocial Evaluation - 07/06/23 1540       Psychosocial Evaluation & Interventions   Interventions Stress management education;Relaxation education;Encouraged to exercise with the program and follow exercise prescription    Comments Holly Allison is a pleasant young woman who is coming to rehab for her stable angina. 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 She states the chest pain isn't constant, but only when she exerts herself. She has a good support system from her husband and daughter, which her daughter is a CNA here at the hospital. Holly Allison does have some stressors in her life with her health and her husband's issues as he has been sick the last few months. She currently does not do any exercise at home, but is looking forward to starting exercise here.    Expected Outcomes Short: Attend Rehab regularly. Long: Work on stress reduction techniques.    Continue Psychosocial Services  Follow up required by staff          Psychosocial Re-Evaluation:   Psychosocial Discharge (Final Psychosocial Re-Evaluation):   Vocational Rehabilitation: Provide vocational rehab assistance to qualifying candidates.   Vocational Rehab Evaluation & Intervention:  Vocational Rehab - 07/06/23 1547       Initial Vocational Rehab Evaluation & Intervention   Assessment shows need for Vocational Rehabilitation No          Education: Education Goals: Education classes will be provided on a weekly basis, covering required topics. Participant will state understanding/return demonstration of topics presented.  Learning Barriers/Preferences:  Learning Barriers/Preferences - 07/06/23 1547       Learning Barriers/Preferences   Learning Barriers Sight   Wears contacts   Learning Preferences None          Education Topics: Hypertension, Hypertension Reduction -Define heart disease  and high blood pressure. Discus how high blood pressure affects the body and ways to reduce high blood pressure.   Exercise and Your Heart -Discuss why it is important to exercise, the FITT principles of exercise, normal and abnormal responses to exercise, and how to exercise safely.   Angina -Discuss definition of angina, causes of angina, treatment of angina, and how to decrease risk of having angina.  Cardiac Medications -Review what the following cardiac medications are used for, how they affect the body, and side effects that may occur when taking the medications.  Medications include Aspirin, Beta blockers, calcium  channel blockers, ACE Inhibitors, angiotensin receptor blockers, diuretics, digoxin, and antihyperlipidemics.   Congestive Heart Failure -Discuss the definition of CHF, how to live with CHF, the signs and symptoms of CHF, and how keep track of weight and sodium intake.   Heart Disease and Intimacy -Discus the effect sexual activity has on the heart, how changes occur during intimacy as we age, and safety during sexual activity.   Smoking Cessation / COPD -Discuss different methods to quit smoking, the health benefits of quitting smoking, and the definition of COPD.   Nutrition I: Fats -Discuss the types of cholesterol, what cholesterol does to the heart, and how cholesterol levels can be controlled.   Nutrition II: Labels -Discuss the different components of food labels and how to read food label   Heart Parts/Heart Disease and PAD -Discuss the anatomy of the heart, the pathway of blood circulation through the heart, and these are affected by heart disease.   Stress I: Signs and Symptoms -Discuss the causes of stress, how stress may lead to anxiety and depression, and ways to limit stress.   Stress II: Relaxation -Discuss different types of relaxation techniques to limit stress.   Warning Signs of Stroke / TIA -Discuss definition of a stroke, what the  signs and symptoms are of a stroke, and how to identify when someone is having stroke.   Knowledge Questionnaire Score:  Knowledge Questionnaire Score - 07/20/23 0754       Knowledge Questionnaire Score   Pre Score 23/26          Core Components/Risk Factors/Patient Goals at Admission:  Personal Goals and Risk Factors at Admission - 07/06/23 1547       Core Components/Risk Factors/Patient Goals on Admission    Weight Management Yes    Intervention Weight Management: Develop a combined nutrition and exercise program designed to reach desired caloric intake, while maintaining appropriate intake of nutrient and fiber, sodium and fats, and appropriate energy expenditure required for the weight goal.;Weight Management: Provide education and appropriate resources to help participant work on and attain dietary goals.    Expected Outcomes Short Term: Continue to assess and modify interventions until short term weight is achieved;Long Term: Adherence to nutrition and physical activity/exercise program aimed toward attainment of established weight goal;Understanding of distribution of calorie intake throughout the day with the consumption of 4-5 meals/snacks;Understanding recommendations for meals to include 15-35% energy as protein, 25-35% energy from fat, 35-60% energy from carbohydrates, less than 200mg  of dietary cholesterol, 20-35 gm of total fiber daily    Lipids Yes    Intervention Provide education and support for participant on nutrition & aerobic/resistive exercise along with prescribed medications to achieve LDL 70mg , HDL >40mg .    Expected Outcomes Short Term: Participant states understanding of desired cholesterol values and is compliant with medications prescribed. Participant is following exercise prescription and nutrition guidelines.;Long Term: Cholesterol controlled with medications as prescribed, with individualized exercise RX and with personalized nutrition plan. Value goals: LDL  < 70mg , HDL > 40 mg.    Stress Yes    Intervention Offer individual and/or small group education and counseling on adjustment to heart disease, stress management and health-related lifestyle change. Teach and support self-help strategies.;Refer participants experiencing significant psychosocial distress to appropriate mental health specialists for further evaluation and treatment. When possible,  include family members and significant others in education/counseling sessions.    Expected Outcomes Short Term: Participant demonstrates changes in health-related behavior, relaxation and other stress management skills, ability to obtain effective social support, and compliance with psychotropic medications if prescribed.;Long Term: Emotional wellbeing is indicated by absence of clinically significant psychosocial distress or social isolation.          Core Components/Risk Factors/Patient Goals Review:    Core Components/Risk Factors/Patient Goals at Discharge (Final Review):    ITP Comments:  ITP Comments     Row Name 07/06/23 1540 08/04/23 1524 08/09/23 0812 08/23/23 1528 08/31/23 1157   ITP Comments Completed virtual orientation today.  EP evaluation is scheduled for pending date and time as patient needs to re-schedule her in-person appt.  Documentation for diagnosis can be found in [facility] encounter 06/30/23. Patient arrived for 1st visit/orientation/education at 1415. Patient was referred to CR by Dr. Mallipeddi due to Stable Angina. During orientation advised patient on arrival and appointment times what to wear, what to do before, during and after exercise. Reviewed attendance and class policy.  Pt is scheduled to return Cardiac Rehab on 08/04/23 at 1500. Pt was advised to come to class 15 minutes before class starts.  Discussed RPE/Dpysnea scales. Patient participated in warm up stretches. Patient was able to complete 6 minute walk test.  Telemetry:NSR with ST segment depression. Patient was measured  for the equipment. Discussed equipment safety with patient. Took patient pre-anthropometric measurements. Patient finished visit at 1300. Patient is on medical hold while medications are being updated. SHe has a follow up on 09/21/2023 and will see if she is cleared after follow up to resume cardiac rehab Continue to wait for clearance for after appt on 09/21/23 30 day review completed. ITP sent to Dr. Armida Lander, Medical Director of Cardiac Rehab. Continue with ITP unless changes are made by physician.  Medical hold until f/u appt 09/21/23    Row Name 09/28/23 1052           ITP Comments 30 day review completed. ITP sent to Dr. Armida Lander, Medical Director of Cardiac Rehab. Continue with ITP unless changes are made by physician. Patient has been cleared to start the program but has not started yet. Plans to start 10/04/23.          Comments: 30 Day Review

## 2023-09-29 ENCOUNTER — Encounter (HOSPITAL_COMMUNITY)

## 2023-10-04 ENCOUNTER — Encounter (HOSPITAL_COMMUNITY)
Admission: RE | Admit: 2023-10-04 | Discharge: 2023-10-04 | Disposition: A | Discharge status: Home / Self Care | Source: Ambulatory Visit | Attending: Internal Medicine

## 2023-10-04 DIAGNOSIS — I2089 Other forms of angina pectoris: Secondary | ICD-10-CM | POA: Diagnosis not present

## 2023-10-04 NOTE — Progress Notes (Addendum)
 Daily Session Note  Patient Details  Name: Holly Allison MRN: [id number] Date of Birth: 1966/02/12 Referring Provider:   Flowsheet Row CARDIAC REHAB PHASE II ORIENTATION from 08/03/2023 in [facility] CARDIAC REHABILITATION  Referring Provider Stacia Beauvais    Encounter Date: 10/04/2023  Check In:  Session Check In - 10/04/23 1450       Check-In   Supervising physician immediately available to respond to emergencies See telemetry face sheet for immediately available MD    Location [facility]-Cardiac [facility] Pulmonary [facility]    Staff Present Laymon Rattler, BSN, RN, WTA-C;Heather Con, BS, Exercise Physiologist;Phyllis Billingsley, RN    Virtual Visit No    Medication changes reported     No    Fall or balance concerns reported    No    Tobacco Cessation No Change    Current number of cigarettes/nicotine per day     0    Warm-up and Cool-down Performed on first and last piece of equipment    Resistance Training Performed No    VAD Patient? No    PAD/SET Patient? No      Pain Assessment   Currently in Pain? No/denies          Capillary Blood Glucose: No results found for this or any previous visit (from the past 24 hours).    Social History   Tobacco Use  Smoking Status Never  Smokeless Tobacco Never    Goals Met:  Independence with exercise equipment Exercise tolerated well No report of concerns or symptoms today Strength training completed today  Goals Unmet:  Not Applicable  Comments: First full day of exercise!  Patient was oriented to gym and equipment including functions, settings, policies, and procedures.  Patient's individual exercise prescription and treatment plan were reviewed.  All starting workloads were established based on the results of the 6 minute walk test done at initial orientation visit.  The plan for exercise progression was also introduced and progression will be customized based on patient's performance and goals.

## 2023-10-06 ENCOUNTER — Encounter (HOSPITAL_COMMUNITY)
Admission: RE | Admit: 2023-10-06 | Discharge: 2023-10-06 | Disposition: A | Discharge status: Home / Self Care | Source: Ambulatory Visit | Attending: Internal Medicine | Admitting: Internal Medicine

## 2023-10-06 DIAGNOSIS — I2089 Other forms of angina pectoris: Secondary | ICD-10-CM | POA: Diagnosis not present

## 2023-10-06 NOTE — Progress Notes (Signed)
 Daily Session Note  Patient Details  Name: Holly Allison MRN: [id number] Date of Birth: 06-02-1965 Referring Provider:   Flowsheet Row CARDIAC REHAB PHASE II ORIENTATION from 08/03/2023 in [facility] CARDIAC REHABILITATION  Referring Provider Holly Allison    Encounter Date: 10/06/2023  Check In:  Session Check In - 10/06/23 1420       Check-In   Supervising physician immediately available to respond to emergencies See telemetry face sheet for immediately available MD    Location [facility]    Staff Present Adrien Louder, RN, BSN;Heather Con HECKLE, Exercise Physiologist;Hillary Dean BSN, RN   Laymon Landau, [place]   Virtual Visit No    Medication changes reported     No    Fall or balance concerns reported    No    Warm-up and Cool-down Performed on first and last piece of equipment    Resistance Training Performed Yes    VAD Patient? No    PAD/SET Patient? No      Pain Assessment   Currently in Pain? No/denies    Multiple Pain Sites No          Capillary Blood Glucose: No results found for this or any previous visit (from the past 24 hours).    Social History   Tobacco Use  Smoking Status Never  Smokeless Tobacco Never    Goals Met:  Independence with exercise equipment Exercise tolerated well No report of concerns or symptoms today Strength training completed today  Goals Unmet:  Not Applicable  Comments: Pt able to follow exercise prescription today without complaint.  Will continue to monitor for progression.

## 2023-10-11 ENCOUNTER — Encounter (HOSPITAL_COMMUNITY)
Admission: RE | Admit: 2023-10-11 | Discharge: 2023-10-11 | Disposition: A | Discharge status: Home / Self Care | Source: Ambulatory Visit | Attending: Internal Medicine | Admitting: Internal Medicine

## 2023-10-11 DIAGNOSIS — I2089 Other forms of angina pectoris: Secondary | ICD-10-CM | POA: Insufficient documentation

## 2023-10-11 NOTE — Progress Notes (Signed)
 Daily Session Note  Patient Details  Name: Holly Allison MRN: [id number] Date of Birth: 03/27/1966 Referring Provider:   Flowsheet Row CARDIAC REHAB PHASE II ORIENTATION from 08/03/2023 in [facility] CARDIAC REHABILITATION  Referring Provider Stacia Beauvais    Encounter Date: 10/11/2023  Check In:  Session Check In - 10/11/23 1500       Check-In   Supervising physician immediately available to respond to emergencies See telemetry face sheet for immediately available MD    Location [facility]    Staff Present Powell Benders, BS, Exercise Physiologist;Grettel Rames Jackquline, BSN, RN, Estrella Daring, BS, RRT, CPFT    Virtual Visit No    Medication changes reported     No    Fall or balance concerns reported    No    Tobacco Cessation No Change    Warm-up and Cool-down Performed on first and last piece of equipment    Resistance Training Performed Yes    VAD Patient? No    PAD/SET Patient? No      Pain Assessment   Currently in Pain? No/denies          Capillary Blood Glucose: No results found for this or any previous visit (from the past 24 hours).    Social History   Tobacco Use  Smoking Status Never  Smokeless Tobacco Never    Goals Met:  Independence with exercise equipment Exercise tolerated well No report of concerns or symptoms today Strength training completed today  Goals Unmet:  Not Applicable  Comments: Pt able to follow exercise prescription today without complaint.  Will continue to monitor for progression.

## 2023-10-13 ENCOUNTER — Encounter (HOSPITAL_COMMUNITY)
Admission: RE | Admit: 2023-10-13 | Discharge: 2023-10-13 | Disposition: A | Discharge status: Home / Self Care | Source: Ambulatory Visit | Attending: Internal Medicine | Admitting: Internal Medicine

## 2023-10-13 DIAGNOSIS — I2089 Other forms of angina pectoris: Secondary | ICD-10-CM | POA: Diagnosis not present

## 2023-10-13 NOTE — Progress Notes (Signed)
 Daily Session Note  Patient Details  Name: Holly Allison MRN: [id number] Date of Birth: [birth year] Referring Provider:   Flowsheet Row CARDIAC REHAB PHASE II ORIENTATION from 08/03/2023 in [facility] CARDIAC REHABILITATION  Referring Provider Stacia Beauvais    Encounter Date: 10/13/2023  Check In:  Session Check In - 10/13/23 1433       Check-In   Supervising physician immediately available to respond to emergencies See telemetry face sheet for immediately available MD    Location [facility]    Staff Present Rolland Sake BSN, RN;Heather Con, [place], Exercise Physiologist;Debra Vicci, RN, BSN    Virtual Visit No    Medication changes reported     No    Fall or balance concerns reported    No    Tobacco Cessation No Change    Warm-up and Cool-down Performed on first and last piece of equipment    Resistance Training Performed Yes    VAD Patient? No    PAD/SET Patient? No      Pain Assessment   Currently in Pain? No/denies    Multiple Pain Sites No          Capillary Blood Glucose: No results found for this or any previous visit (from the past 24 hours).    Social History   Tobacco Use  Smoking Status Never  Smokeless Tobacco Never    Goals Met:  Independence with exercise equipment Exercise tolerated well No report of concerns or symptoms today Strength training completed today  Goals Unmet:  Not Applicable  Comments: .Pt able to follow exercise prescription today without complaint.  Will continue to monitor for progression.

## 2023-10-18 ENCOUNTER — Encounter (HOSPITAL_COMMUNITY)
Admission: RE | Admit: 2023-10-18 | Discharge: 2023-10-18 | Disposition: A | Discharge status: Home / Self Care | Source: Ambulatory Visit | Attending: Internal Medicine

## 2023-10-18 DIAGNOSIS — I2089 Other forms of angina pectoris: Secondary | ICD-10-CM | POA: Diagnosis not present

## 2023-10-18 NOTE — Progress Notes (Signed)
 Daily Session Note  Patient Details  Name: KIZZIE COTTEN MRN: [id number] Date of Birth: [birth year] Referring Provider:   Flowsheet Row CARDIAC REHAB PHASE II ORIENTATION from 08/03/2023 in [facility] CARDIAC REHABILITATION  Referring Provider Stacia Beauvais    Encounter Date: 10/18/2023  Check In:  Session Check In - 10/18/23 1456       Check-In   Supervising physician immediately available to respond to emergencies See telemetry face sheet for immediately available MD    Location [facility]    Staff Present Powell Benders, BS, Exercise Physiologist;Brittany Jackquline, BSN, RN, WTA-C;Phyllis Billingsley, RN    Virtual Visit No    Medication changes reported     No    Fall or balance concerns reported    No    Tobacco Cessation No Change    Warm-up and Cool-down Performed on first and last piece of equipment    Resistance Training Performed Yes    VAD Patient? No    PAD/SET Patient? No      Pain Assessment   Currently in Pain? No/denies    Multiple Pain Sites No          Capillary Blood Glucose: No results found for this or any previous visit (from the past 24 hours).    Social History   Tobacco Use  Smoking Status Never  Smokeless Tobacco Never    Goals Met:  Independence with exercise equipment Exercise tolerated well No report of concerns or symptoms today Strength training completed today  Goals Unmet:  Not Applicable  Comments: Pt able to follow exercise prescription today without complaint.  Will continue to monitor for progression.

## 2023-10-20 ENCOUNTER — Encounter (HOSPITAL_COMMUNITY)
Admission: RE | Admit: 2023-10-20 | Discharge: 2023-10-20 | Disposition: A | Discharge status: Home / Self Care | Source: Ambulatory Visit | Attending: Internal Medicine | Admitting: Internal Medicine

## 2023-10-20 DIAGNOSIS — I2089 Other forms of angina pectoris: Secondary | ICD-10-CM | POA: Diagnosis not present

## 2023-10-20 NOTE — Progress Notes (Signed)
 Daily Session Note  Patient Details  Name: Holly Allison MRN: [id number] Date of Birth: 05-15-1965 Referring Provider:   Flowsheet Row CARDIAC REHAB PHASE II ORIENTATION from 08/03/2023 in [facility] CARDIAC REHABILITATION  Referring Provider Stacia Beauvais    Encounter Date: 10/20/2023  Check In:  Session Check In - 10/20/23 1420       Check-In   Supervising physician immediately available to respond to emergencies See telemetry face sheet for immediately available MD    Location [facility]    Staff Present Adrien Louder, RN, BSN;Jessica Vonzell, MA, RCEP, CCRP, CCET;Heather Con HECKLE, Exercise Physiologist;Hillary Dean BSN, RN    Virtual Visit No    Medication changes reported     No    Fall or balance concerns reported    No    Warm-up and Cool-down Performed on first and last piece of equipment    Resistance Training Performed Yes    VAD Patient? No    PAD/SET Patient? No      Pain Assessment   Currently in Pain? No/denies    Multiple Pain Sites No          Capillary Blood Glucose: No results found for this or any previous visit (from the past 24 hours).    Social History   Tobacco Use  Smoking Status Never  Smokeless Tobacco Never    Goals Met:  Independence with exercise equipment Exercise tolerated well No report of concerns or symptoms today Strength training completed today  Goals Unmet:  Not Applicable  Comments: Pt able to follow exercise prescription today without complaint.  Will continue to monitor for progression.

## 2023-10-24 ENCOUNTER — Telehealth: Payer: Self-pay | Admitting: Internal Medicine

## 2023-10-24 NOTE — Telephone Encounter (Signed)
 Patient is concerned that her Metoprolol  is causing her BP to drop lower than usaual after starting ZepBound 2.5 Mg weekly 4 weeks ago. Patient increase metoprolol  on 06/30/23 to 37.5 patient states she has lost 8 lbs since starting zeopbpound.  Patient has not checked recent readings at home except today 102/64 HR 80  She gets it checked regularly at Cardiac rehab  6/17-120/66 HR-57 6/24-118/80 HR-82  7/1-118/62 HR-61 7/3-116/60 HR-61 7/8-110/64 HR-73 7/10- 102/62 HR 91  Advised patient I would route to provider for advice, all medications on med list are updated, if need more information there are several BP readings on her days at Cardiac rehab notes.

## 2023-10-24 NOTE — Telephone Encounter (Signed)
 Pt c/o BP issue: STAT if pt c/o blurred vision, one-sided weakness or slurred speech.   1. What is your BP concern?  BP has been dropping the past 2 weeks.   2. Have you taken any BP medication today? No. Patient says she normally takes it at night. She says Metoprolol  was increased, but she thinks she needs it may need to be decreased again.  3. What are your last 5 BP readings? 7/14: 106/64 when she woke up 7/10: 102/62 - 110/64  4. Are you having any other symptoms (ex. Dizziness, headache, blurred vision, passed out)?  Dizziness with sudden movements

## 2023-10-25 ENCOUNTER — Encounter (HOSPITAL_COMMUNITY)
Admission: RE | Admit: 2023-10-25 | Discharge: 2023-10-25 | Disposition: A | Discharge status: Home / Self Care | Source: Ambulatory Visit | Attending: Internal Medicine | Admitting: Internal Medicine

## 2023-10-25 DIAGNOSIS — I2089 Other forms of angina pectoris: Secondary | ICD-10-CM | POA: Diagnosis not present

## 2023-10-25 MED ORDER — METOPROLOL SUCCINATE ER 25 MG PO TB24: 12.5000 mg | ORAL_TABLET | Freq: Every day | ORAL | 1 refills | Status: DC

## 2023-10-25 NOTE — Progress Notes (Signed)
 Daily Session Note  Patient Details  Name: Holly Allison MRN: [id number] Date of Birth: 1965-07-16 Referring Provider:   Flowsheet Row CARDIAC REHAB PHASE II ORIENTATION from 08/03/2023 in [facility] CARDIAC REHABILITATION  Referring Provider Stacia Beauvais    Encounter Date: 10/25/2023  Check In:  Session Check In - 10/25/23 1445       Check-In   Supervising physician immediately available to respond to emergencies See telemetry face sheet for immediately available MD    Location [facility]    Staff Present Adrien Louder, RN, BSN;Heather Con, BS, Exercise Physiologist    Virtual Visit No    Medication changes reported     No    Fall or balance concerns reported    No    Warm-up and Cool-down Performed on first and last piece of equipment    Resistance Training Performed Yes    VAD Patient? No    PAD/SET Patient? No      Pain Assessment   Currently in Pain? No/denies    Multiple Pain Sites No          Capillary Blood Glucose: No results found for this or any previous visit (from the past 24 hours).    Social History   Tobacco Use  Smoking Status Never  Smokeless Tobacco Never    Goals Met:  Independence with exercise equipment Exercise tolerated well No report of concerns or symptoms today Strength training completed today  Goals Unmet:  Not Applicable  Comments: Pt able to follow exercise prescription today without complaint.  Will continue to monitor for progression.

## 2023-10-25 NOTE — Telephone Encounter (Signed)
 Patient informed and verbalized understanding of plan. New rx sent to pharmacy

## 2023-10-26 ENCOUNTER — Encounter (HOSPITAL_COMMUNITY): Payer: Self-pay | Admitting: *Deleted

## 2023-10-26 DIAGNOSIS — I2089 Other forms of angina pectoris: Secondary | ICD-10-CM

## 2023-10-26 NOTE — Progress Notes (Signed)
 Cardiac Individual Treatment Plan  Patient Details  Name: Holly Allison MRN: [id number] Date of Birth: 08/13/1965 Referring Provider:   Flowsheet Row CARDIAC REHAB PHASE II ORIENTATION from 08/03/2023 in [facility] CARDIAC REHABILITATION  Referring Provider Mallipeddi, Vishnu    Initial Encounter Date:  Flowsheet Row CARDIAC REHAB PHASE II ORIENTATION from 08/03/2023 in [place] CARDIAC REHABILITATION  Date 08/03/23    Visit Diagnosis: Stable angina (HCC)  Patient's Home Medications on Admission:  Current Outpatient Medications:    aspirin EC 81 MG tablet, Take 81 mg by mouth daily. Swallow whole., Disp: , Rfl:    calcium  carbonate (OSCAL) 1500 (600 Ca) MG TABS tablet, Take 600 mg of elemental calcium  by mouth 2 (two) times daily with a meal., Disp: , Rfl:    cetirizine (ZYRTEC) 10 MG tablet, Take 10 mg by mouth daily., Disp: , Rfl:    cholecalciferol (VITAMIN D3) 25 MCG (1000 UNIT) tablet, Take 1,000 Units by mouth 2 (two) times daily., Disp: , Rfl:    Evolocumab  (REPATHA  SURECLICK) 140 MG/ML SOAJ, INJECT 140MG  INTO THE SKIN EVERY 14 DAYS, Disp: 6 mL, Rfl: 1   famotidine  (PEPCID ) 40 MG tablet, Take 40 mg by mouth daily., Disp: , Rfl:    letrozole  (FEMARA ) 2.5 MG tablet, Take 1 tablet (2.5 mg total) by mouth daily., Disp: 90 tablet, Rfl: 3   levothyroxine (SYNTHROID) 50 MCG tablet, Take 50 mcg by mouth daily., Disp: , Rfl:    metoprolol  succinate (TOPROL  XL) 25 MG 24 hr tablet, Take 0.5 tablets (12.5 mg total) by mouth daily., Disp: 45 tablet, Rfl: 1   ranolazine  (RANEXA ) 500 MG 12 hr tablet, Take 1 tablet (500 mg total) by mouth 2 (two) times daily., Disp: 60 tablet, Rfl: 5   triamcinolone  cream (KENALOG) 0.1 %, , Disp: , Rfl:    ZEPBOUND 2.5 MG/0.5ML Pen, Inject 2.5 mg into the skin once a week., Disp: , Rfl:   Past Medical History: Past Medical History:  Diagnosis Date   Angio-edema    Cancer (HCC)    Family history of adverse reaction to anesthesia    Mother    Heart  murmur    Since childhood   Pre-diabetes    Patient states she was told she is prediabetic at yearly physical two months ago. Patient states her A1 C was around 5.6   Urticaria     Tobacco Use: Social History   Tobacco Use  Smoking Status Never  Smokeless Tobacco Never    Labs: Review Flowsheet       Latest Ref Rng & Units 08/06/2020  Labs for ITP Cardiac and Pulmonary Rehab  Hemoglobin A1c 4.8 - 5.6 % 5.5     Capillary Blood Glucose: Lab Results  Component Value Date   GLUCAP 124 (H) 08/30/2014     Exercise Target Goals: Exercise Program Goal: Individual exercise prescription set using results from initial 6 min walk test and THRR while considering  patient's activity barriers and safety.   Exercise Prescription Goal: Starting with aerobic activity 30 plus minutes a day, 3 days per week for initial exercise prescription. Provide home exercise prescription and guidelines that participant acknowledges understanding prior to discharge.  Activity Barriers & Risk Stratification:   6 Minute Walk:  6 Minute Walk     Row Name 08/03/23 1524         6 Minute Walk   Phase Initial     Distance 1140 feet     Walk Time 6 minutes     #  of Rest Breaks 0     MPH 2.16     METS 2.74     RPE 12     Perceived Dyspnea  2     VO2 Peak 9.6     Symptoms Yes (comment)     Comments 5/10 burning between shoulder blades     Resting HR 57 bpm     Resting BP 120/66     Resting Oxygen Saturation  96 %     Exercise Oxygen Saturation  during 6 min walk 96 %     Max Ex. HR 78 bpm     Max Ex. BP 122/68     2 Minute Post BP 118/62        Oxygen Initial Assessment:   Oxygen Re-Evaluation:   Oxygen Discharge (Final Oxygen Re-Evaluation):   Initial Exercise Prescription:  Initial Exercise Prescription - 08/03/23 1500       Date of Initial Exercise RX and Referring Provider   Date 08/03/23    Referring Provider Mallipeddi, Vishnu      Treadmill   MPH 1.8    Grade 0     Minutes 15    METs 2.38      REL-XR   Level 1    Speed 60    Minutes 15    METs 1.9      Prescription Details   Frequency (times per week) 2    Duration Progress to 30 minutes of continuous aerobic without signs/symptoms of physical distress      Intensity   THRR 40-80% of Max Heartrate 95-142    Ratings of Perceived Exertion 11-13    Perceived Dyspnea 0-4      Resistance Training   Training Prescription Yes    Weight 4    Reps 10-15          Perform Capillary Blood Glucose checks as needed.  Exercise Prescription Changes:   Exercise Prescription Changes     Row Name 08/03/23 1500             Response to Exercise   Blood Pressure (Admit) 120/66       Blood Pressure (Exercise) 122/68       Blood Pressure (Exit) 118/62       Heart Rate (Admit) 57 bpm       Heart Rate (Exercise) 78 bpm       Heart Rate (Exit) 62 bpm       Oxygen Saturation (Admit) 96 %       Oxygen Saturation (Exercise) 96 %       Oxygen Saturation (Exit) 96 %       Rating of Perceived Exertion (Exercise) 12       Perceived Dyspnea (Exercise) 2       Symptoms buring in shoulder blands          Exercise Comments:   Exercise Comments     Row Name 10/04/23 1458           Exercise Comments First full day of exercise!  Patient was oriented to gym and equipment including functions, settings, policies, and procedures.  Patient's individual exercise prescription and treatment plan were reviewed.  All starting workloads were established based on the results of the 6 minute walk test done at initial orientation visit.  The plan for exercise progression was also introduced and progression will be customized based on patient's performance and goals.          Exercise Goals and Review:  Exercise Goals     Row Name 08/03/23 1534             Exercise Goals   Increase Physical Activity Yes       Intervention Provide advice, education, support and counseling about physical  activity/exercise needs.;Develop an individualized exercise prescription for aerobic and resistive training based on initial evaluation findings, risk stratification, comorbidities and participant's personal goals.       Expected Outcomes Short Term: Attend rehab on a regular basis to increase amount of physical activity.;Long Term: Add in home exercise to make exercise part of routine and to increase amount of physical activity.;Long Term: Exercising regularly at least 3-5 days a week.       Increase Strength and Stamina Yes       Intervention Provide advice, education, support and counseling about physical activity/exercise needs.;Develop an individualized exercise prescription for aerobic and resistive training based on initial evaluation findings, risk stratification, comorbidities and participant's personal goals.       Expected Outcomes Short Term: Perform resistance training exercises routinely during rehab and add in resistance training at home;Short Term: Increase workloads from initial exercise prescription for resistance, speed, and METs.;Long Term: Improve cardiorespiratory fitness, muscular endurance and strength as measured by increased METs and functional capacity ( )       Able to understand and use rate of perceived exertion (RPE) scale Yes       Intervention Provide education and explanation on how to use RPE scale       Expected Outcomes Short Term: Able to use RPE daily in rehab to express subjective intensity level;Long Term:  Able to use RPE to guide intensity level when exercising independently       Knowledge and understanding of Target Heart Rate Range (THRR) Yes       Intervention Provide education and explanation of THRR including how the numbers were predicted and where they are located for reference       Expected Outcomes Short Term: Able to state/look up THRR;Long Term: Able to use THRR to govern intensity when exercising independently;Short Term: Able to use daily as  guideline for intensity in rehab       Able to check pulse independently Yes       Intervention Provide education and demonstration on how to check pulse in carotid and radial arteries.;Review the importance of being able to check your own pulse for safety during independent exercise       Expected Outcomes Long Term: Able to check pulse independently and accurately;Short Term: Able to explain why pulse checking is important during independent exercise       Understanding of Exercise Prescription Yes       Intervention Provide education, explanation, and written materials on patient's individual exercise prescription       Expected Outcomes Short Term: Able to explain program exercise prescription;Long Term: Able to explain home exercise prescription to exercise independently          Exercise Goals Re-Evaluation :  Exercise Goals Re-Evaluation     Row Name 10/04/23 1509 10/20/23 1552           Exercise Goal Re-Evaluation   Exercise Goals Review Knowledge and understanding of Target Heart Rate Range (THRR);Able to understand and use rate of perceived exertion (RPE) scale Increase Strength and Stamina;Increase Physical Activity;Understanding of Exercise Prescription      Comments Reviewed RPE and dyspnea scale, THR and program prescription with pt today.  Pt voiced understanding and was given  a copy of goals to take home. Holly Allison is doing well in rehab. She has noticed since starting that her endurance and increased some. She did have some burning when she statrted the program between her shoulder blades but it has eased up. She does have some burning if she increases her walking speed near the end of the 15 minutes but it does ease up.      Expected Outcomes Short: Use RPE daily to regulate intensity.  Long: Follow program prescription in THR. Short: increase some on speed  Long: contine to exericse          Discharge Exercise Prescription (Final Exercise Prescription Changes):  Exercise  Prescription Changes - 08/03/23 1500       Response to Exercise   Blood Pressure (Admit) 120/66    Blood Pressure (Exercise) 122/68    Blood Pressure (Exit) 118/62    Heart Rate (Admit) 57 bpm    Heart Rate (Exercise) 78 bpm    Heart Rate (Exit) 62 bpm    Oxygen Saturation (Admit) 96 %    Oxygen Saturation (Exercise) 96 %    Oxygen Saturation (Exit) 96 %    Rating of Perceived Exertion (Exercise) 12    Perceived Dyspnea (Exercise) 2    Symptoms buring in shoulder blands          Nutrition:  Target Goals: Understanding of nutrition guidelines, daily intake of sodium 1500mg , cholesterol 200mg , calories 30% from fat and 7% or less from saturated fats, daily to have 5 or more servings of fruits and vegetables.  Biometrics:  Pre Biometrics - 08/03/23 1535       Pre Biometrics   Height 5' 4 (1.626 m)    Waist Circumference 38.5 inches    Hip Circumference 44 inches    Waist to Hip Ratio 0.88 %    Grip Strength 23.4 kg    Single Leg Stand 25.46 seconds           Nutrition Therapy Plan and Nutrition Goals:   Nutrition Assessments:  MEDIFICTS Score Key: >=70 Need to make dietary changes  40-70 Heart Healthy Diet <= 40 Therapeutic Level Cholesterol Diet  Flowsheet Row CARDIAC VIRTUAL BASED CARE from 07/06/2023 in [facility] CARDIAC REHABILITATION  Picture Your Plate Total Score on Admission 50   Picture Your Plate Scores: <59 Unhealthy dietary pattern with much room for improvement. 41-50 Dietary pattern unlikely to meet recommendations for good health and room for improvement. 51-60 More healthful dietary pattern, with some room for improvement.  >60 Healthy dietary pattern, although there may be some specific behaviors that could be improved.    Nutrition Goals Re-Evaluation:  Nutrition Goals Re-Evaluation     Row Name 10/20/23 1557             Goals   Nutrition Goal healthy eating       Comment Holly Allison is doing well in rehab. She has just started  Zepbound to help with weight. She stated that it has decreased her appitie and she does not want to eat very much. She is working with this and getting use to being on the shot.       Expected Outcome Short: find smaller portions of healthy foods you like to keep up with proteins   long term: focus on eating healthy options          Nutrition Goals Discharge (Final Nutrition Goals Re-Evaluation):  Nutrition Goals Re-Evaluation - 10/20/23 1557       Goals  Nutrition Goal healthy eating    Comment Holly Allison is doing well in rehab. She has just started Zepbound to help with weight. She stated that it has decreased her appitie and she does not want to eat very much. She is working with this and getting use to being on the shot.    Expected Outcome Short: find smaller portions of healthy foods you like to keep up with proteins   long term: focus on eating healthy options          Psychosocial: Target Goals: Acknowledge presence or absence of significant depression and/or stress, maximize coping skills, provide positive support system. Participant is able to verbalize types and ability to use techniques and skills needed for reducing stress and depression.  Initial Review & Psychosocial Screening:   Quality of Life Scores:  Scores of 19 and below usually indicate a poorer quality of life in these areas.  A difference of  2-3 points is a clinically meaningful difference.  A difference of 2-3 points in the total score of the Quality of Life Index has been associated with significant improvement in overall quality of life, self-image, physical symptoms, and general health in studies assessing change in quality of life.  PHQ-9: Review Flowsheet       08/03/2023  Depression screen PHQ 2/9  Decreased Interest 0  Down, Depressed, Hopeless 0  PHQ - 2 Score 0  Altered sleeping 0  Tired, decreased energy 3  Change in appetite 0  Feeling bad or failure about yourself  0  Trouble concentrating 1   Moving slowly or fidgety/restless 0  Suicidal thoughts 0  PHQ-9 Score 4   Interpretation of Total Score  Total Score Depression Severity:  1-4 = Minimal depression, 5-9 = Mild depression, 10-14 = Moderate depression, 15-19 = Moderately severe depression, 20-27 = Severe depression   Psychosocial Evaluation and Intervention:   Psychosocial Re-Evaluation:  Psychosocial Re-Evaluation     Row Name 10/20/23 1555             Psychosocial Re-Evaluation   Current issues with Current Stress Concerns;Current Sleep Concerns       Comments Holly Allison is doing well in rehab. She has had stress and sleep issues due to having two childeren getting married this year and all the planning. She has one wedidng coming up in Auguest so she is stressing making sure everything is in order and her mind races at night some thinking about things that need to be done for the wedding.       Expected Outcomes Short; Find an outlet for stress when it comes to wedding planning, have a list do small list at a time to help decrease stress   Long: exercise to help with stress releif       Interventions Encouraged to attend Cardiac Rehabilitation for the exercise       Continue Psychosocial Services  Follow up required by staff          Psychosocial Discharge (Final Psychosocial Re-Evaluation):  Psychosocial Re-Evaluation - 10/20/23 1555       Psychosocial Re-Evaluation   Current issues with Current Stress Concerns;Current Sleep Concerns    Comments Holly Allison is doing well in rehab. She has had stress and sleep issues due to having two childeren getting married this year and all the planning. She has one wedidng coming up in Auguest so she is stressing making sure everything is in order and her mind races at night some thinking about things that need to  be done for the wedding.    Expected Outcomes Short; Find an outlet for stress when it comes to wedding planning, have a list do small list at a time to help decrease  stress   Long: exercise to help with stress releif    Interventions Encouraged to attend Cardiac Rehabilitation for the exercise    Continue Psychosocial Services  Follow up required by staff          Vocational Rehabilitation: Provide vocational rehab assistance to qualifying candidates.   Vocational Rehab Evaluation & Intervention:   Education: Education Goals: Education classes will be provided on a weekly basis, covering required topics. Participant will state understanding/return demonstration of topics presented.  Learning Barriers/Preferences:   Education Topics: Hypertension, Hypertension Reduction -Define heart disease and high blood pressure. Discus how high blood pressure affects the body and ways to reduce high blood pressure.   Exercise and Your Heart -Discuss why it is important to exercise, the FITT principles of exercise, normal and abnormal responses to exercise, and how to exercise safely.   Angina -Discuss definition of angina, causes of angina, treatment of angina, and how to decrease risk of having angina.   Cardiac Medications -Review what the following cardiac medications are used for, how they affect the body, and side effects that may occur when taking the medications.  Medications include Aspirin, Beta blockers, calcium  channel blockers, ACE Inhibitors, angiotensin receptor blockers, diuretics, digoxin, and antihyperlipidemics.   Congestive Heart Failure -Discuss the definition of CHF, how to live with CHF, the signs and symptoms of CHF, and how keep track of weight and sodium intake.   Heart Disease and Intimacy -Discus the effect sexual activity has on the heart, how changes occur during intimacy as we age, and safety during sexual activity. Flowsheet Row CARDIAC REHAB PHASE II EXERCISE from 10/20/2023 in [place] CARDIAC REHABILITATION  Date 10/13/23  Educator HB  Instruction Review Code 1- Verbalizes Understanding    Smoking Cessation /  COPD -Discuss different methods to quit smoking, the health benefits of quitting smoking, and the definition of COPD.   Nutrition I: Fats -Discuss the types of cholesterol, what cholesterol does to the heart, and how cholesterol levels can be controlled. Flowsheet Row CARDIAC REHAB PHASE II EXERCISE from 10/20/2023 in [place] CARDIAC REHABILITATION  Date 10/20/23  Educator [facility]  Instruction Review Code 1- Verbalizes Understanding    Nutrition II: Labels -Discuss the different components of food labels and how to read food label   Heart Parts/Heart Disease and PAD -Discuss the anatomy of the heart, the pathway of blood circulation through the heart, and these are affected by heart disease.   Stress I: Signs and Symptoms -Discuss the causes of stress, how stress may lead to anxiety and depression, and ways to limit stress.   Stress II: Relaxation -Discuss different types of relaxation techniques to limit stress.   Warning Signs of Stroke / TIA -Discuss definition of a stroke, what the signs and symptoms are of a stroke, and how to identify when someone is having stroke.   Knowledge Questionnaire Score:   Core Components/Risk Factors/Patient Goals at Admission:   Core Components/Risk Factors/Patient Goals Review:   Goals and Risk Factor Review     Row Name 10/20/23 1600             Core Components/Risk Factors/Patient Goals Review   Personal Goals Review Weight Management/Obesity;Hypertension       Review Holly Allison is doing well in rehab. We talked about her  BP today. It has been lower in the past weeks and she has been feeling sluggish as well. She stated that her BP medication was increased by 1/2 and us  wodering if this is causing it to be lower. Informed her to contact her doctor about how she is feeling and giving her a printout of her BP to show the decrease is BP       Expected Outcomes Short: contact Card about BP  long: keep checking BP          Core  Components/Risk Factors/Patient Goals at Discharge (Final Review):   Goals and Risk Factor Review - 10/20/23 1600       Core Components/Risk Factors/Patient Goals Review   Personal Goals Review Weight Management/Obesity;Hypertension    Review Holly Allison is doing well in rehab. We talked about her BP today. It has been lower in the past weeks and she has been feeling sluggish as well. She stated that her BP medication was increased by 1/2 and us  wodering if this is causing it to be lower. Informed her to contact her doctor about how she is feeling and giving her a printout of her BP to show the decrease is BP    Expected Outcomes Short: contact Card about BP  long: keep checking BP          ITP Comments:  ITP Comments     Row Name 08/04/23 1524 08/09/23 0812 08/23/23 1528 08/31/23 1157 09/28/23 1052   ITP Comments Patient arrived for 1st visit/orientation/education at 1415. Patient was referred to CR by Dr. Mallipeddi due to Stable Angina. During orientation advised patient on arrival and appointment times what to wear, what to do before, during and after exercise. Reviewed attendance and class policy.  Pt is scheduled to return Cardiac Rehab on 08/04/23 at 1500. Pt was advised to come to class 15 minutes before class starts.  Discussed RPE/Dpysnea scales. Patient participated in warm up stretches. Patient was able to complete 6 minute walk test.  Telemetry:NSR with ST segment depression. Patient was measured for the equipment. Discussed equipment safety with patient. Took patient pre-anthropometric measurements. Patient finished visit at 1300. Patient is on medical hold while medications are being updated. SHe has a follow up on 09/21/2023 and will see if she is cleared after follow up to resume cardiac rehab Continue to wait for clearance for after appt on 09/21/23 30 day review completed. ITP sent to Dr. Dorn Ross, Medical Director of Cardiac Rehab. Continue with ITP unless changes are made by  physician.  Medical hold until f/u appt 09/21/23 30 day review completed. ITP sent to Dr. Dorn Ross, Medical Director of Cardiac Rehab. Continue with ITP unless changes are made by physician. Patient has been cleared to start the program but has not started yet. Plans to start 10/04/23.    Row Name 10/04/23 1458 10/26/23 0955         ITP Comments First full day of exercise!  Patient was oriented to gym and equipment including functions, settings, policies, and procedures.  Patient's individual exercise prescription and treatment plan were reviewed.  All starting workloads were established based on the results of the 6 minute walk test done at initial orientation visit.  The plan for exercise progression was also introduced and progression will be customized based on patient's performance and goals. 30 day review completed. ITP sent to Dr. Dorn Ross, Medical Director of Cardiac Rehab. Continue with ITP unless changes are made by physician.  New to program.  Comments: 30 day review

## 2023-10-27 ENCOUNTER — Encounter (HOSPITAL_COMMUNITY)
Admission: RE | Admit: 2023-10-27 | Discharge: 2023-10-27 | Disposition: A | Discharge status: Home / Self Care | Source: Ambulatory Visit | Attending: Internal Medicine | Admitting: Internal Medicine

## 2023-10-27 DIAGNOSIS — I2089 Other forms of angina pectoris: Secondary | ICD-10-CM

## 2023-10-27 NOTE — Progress Notes (Signed)
 Daily Session Note  Patient Details  Name: LATARSHA ZANI MRN: [id number] Date of Birth: 26-Sep-1965 Referring Provider:   Flowsheet Row CARDIAC REHAB PHASE II ORIENTATION from 08/03/2023 in [facility] CARDIAC REHABILITATION  Referring Provider Stacia Beauvais    Encounter Date: 10/27/2023  Check In:  Session Check In - 10/27/23 1435       Check-In   Supervising physician immediately available to respond to emergencies See telemetry face sheet for immediately available MD    Location [facility]-Cardiac [facility] Pulmonary [facility]    Staff Present Rolland Sake BSN, RN;Heather Con, BS, Exercise Physiologist;Jessica [place], [place], RCEP, CCRP, CCET    Virtual Visit No    Medication changes reported     No    Fall or balance concerns reported    No    Tobacco Cessation No Change    Warm-up and Cool-down Performed on first and last piece of equipment    Resistance Training Performed Yes    VAD Patient? No    PAD/SET Patient? No      Pain Assessment   Currently in Pain? No/denies    Multiple Pain Sites No          Capillary Blood Glucose: No results found for this or any previous visit (from the past 24 hours).    Social History   Tobacco Use  Smoking Status Never  Smokeless Tobacco Never    Goals Met:  Independence with exercise equipment Exercise tolerated well No report of concerns or symptoms today Strength training completed today  Goals Unmet:  Not Applicable  Comments: .Pt able to follow exercise prescription today without complaint.  Will continue to monitor for progression.

## 2023-10-31 DIAGNOSIS — R1012 Left upper quadrant pain: Secondary | ICD-10-CM | POA: Diagnosis not present

## 2023-10-31 DIAGNOSIS — R1013 Epigastric pain: Secondary | ICD-10-CM | POA: Diagnosis not present

## 2023-11-01 ENCOUNTER — Encounter (HOSPITAL_COMMUNITY)
Admission: RE | Admit: 2023-11-01 | Discharge: 2023-11-01 | Disposition: A | Discharge status: Home / Self Care | Source: Ambulatory Visit | Attending: Internal Medicine | Admitting: Internal Medicine

## 2023-11-01 DIAGNOSIS — I2089 Other forms of angina pectoris: Secondary | ICD-10-CM

## 2023-11-01 NOTE — Progress Notes (Signed)
 Daily Session Note  Patient Details  Name: Holly Allison MRN: [id number] Date of Birth: 04-Jul-1965 Referring Provider:   Flowsheet Row CARDIAC REHAB PHASE II ORIENTATION from 08/03/2023 in [facility] CARDIAC REHABILITATION  Referring Provider Stacia Beauvais    Encounter Date: 11/01/2023  Check In:  Session Check In - 11/01/23 1450       Check-In   Supervising physician immediately available to respond to emergencies See telemetry face sheet for immediately available MD    Location [facility]    Staff Present Powell Benders, BS, Exercise Physiologist;Kyan Giannone Jackquline, BSN, RN, WTA-C    Virtual Visit No    Medication changes reported     No    Fall or balance concerns reported    No    Tobacco Cessation No Change    Warm-up and Cool-down Performed on first and last piece of equipment    Resistance Training Performed Yes    VAD Patient? No    PAD/SET Patient? No      Pain Assessment   Currently in Pain? No/denies          Capillary Blood Glucose: No results found for this or any previous visit (from the past 24 hours).    Social History   Tobacco Use  Smoking Status Never  Smokeless Tobacco Never    Goals Met:  Independence with exercise equipment Exercise tolerated well No report of concerns or symptoms today Strength training completed today  Goals Unmet:  Not Applicable  Comments: Pt able to follow exercise prescription today without complaint.  Will continue to monitor for progression.

## 2023-11-03 ENCOUNTER — Encounter (HOSPITAL_COMMUNITY)
Admission: RE | Admit: 2023-11-03 | Discharge: 2023-11-03 | Disposition: A | Discharge status: Home / Self Care | Source: Ambulatory Visit | Attending: Internal Medicine

## 2023-11-03 DIAGNOSIS — I2089 Other forms of angina pectoris: Secondary | ICD-10-CM

## 2023-11-03 NOTE — Progress Notes (Signed)
 Daily Session Note  Patient Details  Name: LATEESHA BEZOLD MRN: [id number] Date of Birth: [birth year] Referring Provider:   Flowsheet Row CARDIAC REHAB PHASE II ORIENTATION from 08/03/2023 in [facility] CARDIAC REHABILITATION  Referring Provider Stacia Beauvais    Encounter Date: 11/03/2023  Check In:  Session Check In - 11/03/23 1442       Check-In   Supervising physician immediately available to respond to emergencies See telemetry face sheet for immediately available MD    Location [facility]    Staff Present Powell Benders, BS, Exercise Physiologist;Debra Vicci, RN, BSN    Virtual Visit No    Medication changes reported     No    Fall or balance concerns reported    No    Tobacco Cessation No Change    Warm-up and Cool-down Performed on first and last piece of equipment    Resistance Training Performed Yes    VAD Patient? No    PAD/SET Patient? No      Pain Assessment   Currently in Pain? No/denies    Multiple Pain Sites No          Capillary Blood Glucose: No results found for this or any previous visit (from the past 24 hours).    Social History   Tobacco Use  Smoking Status Never  Smokeless Tobacco Never    Goals Met:  Independence with exercise equipment Exercise tolerated well No report of concerns or symptoms today Strength training completed today  Goals Unmet:  Not Applicable  Comments: Pt able to follow exercise prescription today without complaint.  Will continue to monitor for progression.

## 2023-11-08 ENCOUNTER — Encounter (HOSPITAL_COMMUNITY)
Admission: RE | Admit: 2023-11-08 | Discharge: 2023-11-08 | Disposition: A | Discharge status: Home / Self Care | Source: Ambulatory Visit | Attending: Internal Medicine | Admitting: Internal Medicine

## 2023-11-08 DIAGNOSIS — I2089 Other forms of angina pectoris: Secondary | ICD-10-CM

## 2023-11-08 NOTE — Progress Notes (Addendum)
 Daily Session Note  Patient Details  Name: Holly Allison MRN: [id number] Date of Birth: [birth year] Referring Provider:   Flowsheet Row CARDIAC REHAB PHASE II ORIENTATION from 08/03/2023 in [facility] CARDIAC REHABIL[facility]  Referring Provider Stacia Beauvais    Encounter Date: 11/08/2023  Check In:  Session Check In - 11/08/23 1453       Check-In   Supervising physician immediately available to respond to emergencies See telemetry face sheet for immediately available MD    Location [facility]    Staff Present Laymon Rattler, BSN, RN, Rosalba Gelineau, MA, RCEP, CCRP, CCET    Virtual Visit No    Medication changes reported     No    Fall or balance concerns reported    No    Warm-up and Cool-down Performed on first and last piece of equipment    Resistance Training Performed Yes    VAD Patient? No    PAD/SET Patient? No      Pain Assessment   Currently in Pain? No/denies          Capillary Blood Glucose: No results found for this or any previous visit (from the past 24 hours).    Social History   Tobacco Use  Smoking Status Never  Smokeless Tobacco Never    Goals Met:  Independence with exercise equipment Exercise tolerated well No report of concerns or symptoms today Strength training completed today  Goals Unmet:  Not Applicable  Comments: Pt able to follow exercise prescription today without complaint.  Will continue to monitor for progression.   Reviewed home exercise with pt today.  Pt plans to walk and use gym equipment at home for exercise.  Reviewed THR, pulse, RPE, sign and symptoms, pulse oximetery and when to call 911 or MD.  Also discussed weather considerations and indoor options.  Pt voiced understanding.

## 2023-11-10 ENCOUNTER — Encounter (HOSPITAL_COMMUNITY)
Admission: RE | Admit: 2023-11-10 | Discharge: 2023-11-10 | Disposition: A | Discharge status: Home / Self Care | Source: Ambulatory Visit | Attending: Internal Medicine | Admitting: Internal Medicine

## 2023-11-10 DIAGNOSIS — I2089 Other forms of angina pectoris: Secondary | ICD-10-CM | POA: Diagnosis not present

## 2023-11-10 NOTE — Progress Notes (Signed)
 Daily Session Note  Patient Details  Name: Holly Allison MRN: [id number] Date of Birth: 1965-10-19 Referring Provider:   Flowsheet Row CARDIAC REHAB PHASE II ORIENTATION from 08/03/2023 in [facility] CARDIAC REHABILITATION  Referring Provider Stacia Beauvais    Encounter Date: 11/10/2023  Check In:  Session Check In - 11/10/23 1449       Check-In   Supervising physician immediately available to respond to emergencies See telemetry face sheet for immediately available MD    Location [facility]    Staff Present Ronal Idell Glen, RN, BSN, Randie Gelineau, MA, RCEP, CCRP, Sueellen Louder, RN, BSN    Virtual Visit No    Medication changes reported     No    Fall or balance concerns reported    No    Warm-up and Cool-down Performed on first and last piece of equipment    Resistance Training Performed Yes    VAD Patient? No    PAD/SET Patient? No      Pain Assessment   Currently in Pain? No/denies          Capillary Blood Glucose: No results found for this or any previous visit (from the past 24 hours).    Social History   Tobacco Use  Smoking Status Never  Smokeless Tobacco Never    Goals Met:  Independence with exercise equipment Exercise tolerated well No report of concerns or symptoms today Strength training completed today  Goals Unmet:  Not Applicable  Comments: Pt able to follow exercise prescription today without complaint.  Will continue to monitor for progression.

## 2023-11-15 ENCOUNTER — Encounter (HOSPITAL_COMMUNITY)

## 2023-11-17 ENCOUNTER — Encounter (HOSPITAL_COMMUNITY)

## 2023-11-19 DIAGNOSIS — M25572 Pain in left ankle and joints of left foot: Secondary | ICD-10-CM | POA: Diagnosis not present

## 2023-11-19 DIAGNOSIS — M79672 Pain in left foot: Secondary | ICD-10-CM | POA: Diagnosis not present

## 2023-11-22 ENCOUNTER — Encounter (HOSPITAL_COMMUNITY)

## 2023-11-23 ENCOUNTER — Encounter (HOSPITAL_COMMUNITY): Payer: Self-pay | Admitting: *Deleted

## 2023-11-23 DIAGNOSIS — I2089 Other forms of angina pectoris: Secondary | ICD-10-CM

## 2023-11-23 NOTE — Progress Notes (Signed)
 Cardiac Individual Treatment Plan  Patient Details  Name: Holly Allison MRN: [id number] Date of Birth: 1965-06-15 Referring Provider:   Flowsheet Row CARDIAC REHAB PHASE II ORIENTATION from 08/03/2023 in [facility] CARDIAC REHABILITATION  Referring Provider Mallipeddi, Vishnu    Initial Encounter Date:  Flowsheet Row CARDIAC REHAB PHASE II ORIENTATION from 08/03/2023 in [place] CARDIAC RE[facility]ITATION  Date 08/03/23    Visit Diagnosis: Stable angina (HCC)  Patient's Home Medications on Admission:  Current Outpatient Medications:    aspirin EC 81 MG tablet, Take 81 mg by mouth daily. Swallow whole., Disp: , Rfl:    calcium  carbonate (OSCAL) 1500 (600 Ca) MG TABS tablet, Take 600 mg of elemental calcium  by mouth 2 (two) times daily with a meal., Disp: , Rfl:    cetirizine (ZYRTEC) 10 MG tablet, Take 10 mg by mouth daily., Disp: , Rfl:    cholecalciferol (VITAMIN D3) 25 MCG (1000 UNIT) tablet, Take 1,000 Units by mouth 2 (two) times daily., Disp: , Rfl:    Evolocumab  (REPATHA  SURECLICK) 140 MG/ML SOAJ, INJECT 140MG  INTO THE SKIN EVERY 14 DAYS, Disp: 6 mL, Rfl: 1   famotidine  (PEPCID ) 40 MG tablet, Take 40 mg by mouth daily., Disp: , Rfl:    letrozole  (FEMARA ) 2.5 MG tablet, Take 1 tablet (2.5 mg total) by mouth daily., Disp: 90 tablet, Rfl: 3   levothyroxine (SYNTHROID) 50 MCG tablet, Take 50 mcg by mouth daily., Disp: , Rfl:    metoprolol  succinate (TOPROL  XL) 25 MG 24 hr tablet, Take 0.5 tablets (12.5 mg total) by mouth daily., Disp: 45 tablet, Rfl: 1   ranolazine  (RANEXA ) 500 MG 12 hr tablet, Take 1 tablet (500 mg total) by mouth 2 (two) times daily., Disp: 60 tablet, Rfl: 5   triamcinolone  cream (KENALOG) 0.1 %, , Disp: , Rfl:    ZEPBOUND 2.5 MG/0.5ML Pen, Inject 2.5 mg into the skin once a week., Disp: , Rfl:   Past Medical History: Past Medical History:  Diagnosis Date   Angio-edema    Cancer (HCC)    Family history of adverse reaction to anesthesia    Mother    Heart  murmur    Since childhood   Pre-diabetes    Patient states she was told she is prediabetic at yearly physical two months ago. Patient states her A1 C was around 5.6   Urticaria     Tobacco Use: Social History   Tobacco Use  Smoking Status Never  Smokeless Tobacco Never    Labs: Review Flowsheet       Latest Ref Rng & Units 08/06/2020  Labs for ITP Cardiac and Pulmonary Rehab  Hemoglobin A1c 4.8 - 5.6 % 5.5     Capillary Blood Glucose: Lab Results  Component Value Date   GLUCAP 124 (H) 08/30/2014     Exercise Target Goals: Exercise Program Goal: Individual exercise prescription set using results from initial 6 min walk test and THRR while considering  patient's activity barriers and safety.   Exercise Prescription Goal: Starting with aerobic activity 30 plus minutes a day, 3 days per week for initial exercise prescription. Provide home exercise prescription and guidelines that participant acknowledges understanding prior to discharge.  Activity Barriers & Risk Stratification:   6 Minute Walk:  6 Minute Walk     Row Name 08/03/23 1524         6 Minute Walk   Phase Initial     Distance 1140 feet     Walk Time 6 minutes     #  of Rest Breaks 0     MPH 2.16     METS 2.74     RPE 12     Perceived Dyspnea  2     VO2 Peak 9.6     Symptoms Yes (comment)     Comments 5/10 burning between shoulder blades     Resting HR 57 bpm     Resting BP 120/66     Resting Oxygen Saturation  96 %     Exercise Oxygen Saturation  during 6 min walk 96 %     Max Ex. HR 78 bpm     Max Ex. BP 122/68     2 Minute Post BP 118/62        Oxygen Initial Assessment:   Oxygen Re-Evaluation:   Oxygen Discharge (Final Oxygen Re-Evaluation):   Initial Exercise Prescription:  Initial Exercise Prescription - 08/03/23 1500       Date of Initial Exercise RX and Referring Provider   Date 08/03/23    Referring Provider Mallipeddi, Vishnu      Treadmill   MPH 1.8    Grade 0     Minutes 15    METs 2.38      REL-XR   Level 1    Speed 60    Minutes 15    METs 1.9      Prescription Details   Frequency (times per week) 2    Duration Progress to 30 minutes of continuous aerobic without signs/symptoms of physical distress      Intensity   THRR 40-80% of Max Heartrate 95-142    Ratings of Perceived Exertion 11-13    Perceived Dyspnea 0-4      Resistance Training   Training Prescription Yes    Weight 4    Reps 10-15          Perform Capillary Blood Glucose checks as needed.  Exercise Prescription Changes:   Exercise Prescription Changes     Row Name 08/03/23 1500 10/25/23 1500 11/08/23 1500 11/10/23 1500       Response to Exercise   Blood Pressure (Admit) 120/66 118/70 -- 110/60    Blood Pressure (Exercise) 122/68 -- -- --    Blood Pressure (Exit) 118/62 120/70 -- 98/60    Heart Rate (Admit) 57 bpm 55 bpm -- 60 bpm    Heart Rate (Exercise) 78 bpm 93 bpm -- 104 bpm    Heart Rate (Exit) 62 bpm 70 bpm -- 53 bpm    Oxygen Saturation (Admit) 96 % -- -- --    Oxygen Saturation (Exercise) 96 % -- -- --    Oxygen Saturation (Exit) 96 % -- -- --    Rating of Perceived Exertion (Exercise) 12 12 -- 12    Perceived Dyspnea (Exercise) 2 -- -- --    Symptoms buring in shoulder blands -- -- --    Duration -- Continue with 30 min of aerobic exercise without signs/symptoms of physical distress. -- Continue with 30 min of aerobic exercise without signs/symptoms of physical distress.    Intensity -- THRR unchanged -- THRR unchanged      Progression   Progression -- Continue to progress workloads to maintain intensity without signs/symptoms of physical distress. -- Continue to progress workloads to maintain intensity without signs/symptoms of physical distress.      Resistance Training   Training Prescription -- Yes -- Yes    Weight -- 4 -- 4    Reps -- 10-15 -- 10-15  Treadmill   MPH -- 2.5 -- 2.8    Grade -- 0 -- 1.5    Minutes -- 15 -- 15    METs  -- 2.91 -- 3.72      REL-XR   Level -- 2 -- 3    Speed -- 50 -- 63    Minutes -- 15 -- 15    METs -- 3.6 -- 3.8      Home Exercise Plan   Plans to continue exercise at -- -- Home (comment)  walking, gym equipment Home (comment)    Frequency -- -- Add 3 additional days to program exercise sessions. Add 3 additional days to program exercise sessions.    Initial Home Exercises Provided -- -- 11/08/23 --       Exercise Comments:   Exercise Comments     Row Name 10/04/23 1458           Exercise Comments First full day of exercise!  Patient was oriented to gym and equipment including functions, settings, policies, and procedures.  Patient's individual exercise prescription and treatment plan were reviewed.  All starting workloads were established based on the results of the 6 minute walk test done at initial orientation visit.  The plan for exercise progression was also introduced and progression will be customized based on patient's performance and goals.          Exercise Goals and Review:   Exercise Goals     Row Name 08/03/23 1534             Exercise Goals   Increase Physical Activity Yes       Intervention Provide advice, education, support and counseling about physical activity/exercise needs.;Develop an individualized exercise prescription for aerobic and resistive training based on initial evaluation findings, risk stratification, comorbidities and participant's personal goals.       Expected Outcomes Short Term: Attend rehab on a regular basis to increase amount of physical activity.;Long Term: Add in home exercise to make exercise part of routine and to increase amount of physical activity.;Long Term: Exercising regularly at least 3-5 days a week.       Increase Strength and Stamina Yes       Intervention Provide advice, education, support and counseling about physical activity/exercise needs.;Develop an individualized exercise prescription for aerobic and resistive  training based on initial evaluation findings, risk stratification, comorbidities and participant's personal goals.       Expected Outcomes Short Term: Perform resistance training exercises routinely during rehab and add in resistance training at home;Short Term: Increase workloads from initial exercise prescription for resistance, speed, and METs.;Long Term: Improve cardiorespiratory fitness, muscular endurance and strength as measured by increased METs and functional capacity ( )       Able to understand and use rate of perceived exertion (RPE) scale Yes       Intervention Provide education and explanation on how to use RPE scale       Expected Outcomes Short Term: Able to use RPE daily in rehab to express subjective intensity level;Long Term:  Able to use RPE to guide intensity level when exercising independently       Knowledge and understanding of Target Heart Rate Range (THRR) Yes       Intervention Provide education and explanation of THRR including how the numbers were predicted and where they are located for reference       Expected Outcomes Short Term: Able to state/look up THRR;Long Term: Able to use THRR to govern intensity  when exercising independently;Short Term: Able to use daily as guideline for intensity in rehab       Able to check pulse independently Yes       Intervention Provide education and demonstration on how to check pulse in carotid and radial arteries.;Review the importance of being able to check your own pulse for safety during independent exercise       Expected Outcomes Long Term: Able to check pulse independently and accurately;Short Term: Able to explain why pulse checking is important during independent exercise       Understanding of Exercise Prescription Yes       Intervention Provide education, explanation, and written materials on patient's individual exercise prescription       Expected Outcomes Short Term: Able to explain program exercise prescription;Long  Term: Able to explain home exercise prescription to exercise independently          Exercise Goals Re-Evaluation :  Exercise Goals Re-Evaluation     Row Name 10/04/23 1509 10/20/23 1552 11/01/23 1457 11/08/23 1515       Exercise Goal Re-Evaluation   Exercise Goals Review Knowledge and understanding of Target Heart Rate Range (THRR);Able to understand and use rate of perceived exertion (RPE) scale Increase Strength and Stamina;Increase Physical Activity;Understanding of Exercise Prescription Increase Physical Activity;Increase Strength and Stamina;Understanding of Exercise Prescription Increase Physical Activity;Increase Strength and Stamina;Able to check pulse independently;Understanding of Exercise Prescription    Comments Reviewed RPE and dyspnea scale, THR and program prescription with pt today.  Pt voiced understanding and was given a copy of goals to take home. Kimbree is doing well in rehab. She has noticed since starting that her endurance and increased some. She did have some burning when she statrted the program between her shoulder blades but it has eased up. She does have some burning if she increases her walking speed near the end of the 15 minutes but it does ease up. Zeffie is doing well in rehab. She is on her 15th session and continues to notice increased endurance. She continues to have no symptoms when exericsing like she did at the oritentation walk test. She has not walked much outside of rehab due to the heat. Reviewed home exercise with pt today.  Pt plans to walk and use gym equipment at home for exercise.  Reviewed THR, pulse, RPE, sign and symptoms, pulse oximetery and when to call 911 or MD.  Also discussed weather considerations and indoor options.  Pt voiced understanding.    Expected Outcomes Short: Use RPE daily to regulate intensity.  Long: Follow program prescription in THR. Short: increase some on speed  Long: contine to exericse Short: start exercisng at home  long  term : continue to exerice at home Short: Start to add in more exercise at home Long: Conitnue to exercise independently        Discharge Exercise Prescription (Final Exercise Prescription Changes):  Exercise Prescription Changes - 11/10/23 1500       Response to Exercise   Blood Pressure (Admit) 110/60    Blood Pressure (Exit) 98/60    Heart Rate (Admit) 60 bpm    Heart Rate (Exercise) 104 bpm    Heart Rate (Exit) 53 bpm    Rating of Perceived Exertion (Exercise) 12    Duration Continue with 30 min of aerobic exercise without signs/symptoms of physical distress.    Intensity THRR unchanged      Progression   Progression Continue to progress workloads to maintain intensity without signs/symptoms of  physical distress.      Resistance Training   Training Prescription Yes    Weight 4    Reps 10-15      Treadmill   MPH 2.8    Grade 1.5    Minutes 15    METs 3.72      REL-XR   Level 3    Speed 63    Minutes 15    METs 3.8      Home Exercise Plan   Plans to continue exercise at Home (comment)    Frequency Add 3 additional days to program exercise sessions.          Nutrition:  Target Goals: Understanding of nutrition guidelines, daily intake of sodium 1500mg , cholesterol 200mg , calories 30% from fat and 7% or less from saturated fats, daily to have 5 or more servings of fruits and vegetables.  Biometrics:  Pre Biometrics - 08/03/23 1535       Pre Biometrics   Height 5' 4 (1.626 m)    Waist Circumference 38.5 inches    Hip Circumference 44 inches    Waist to Hip Ratio 0.88 %    Grip Strength 23.4 kg    Single Leg Stand 25.46 seconds           Nutrition Therapy Plan and Nutrition Goals:   Nutrition Assessments:  MEDIFICTS Score Key: >=70 Need to make dietary changes  40-70 Heart Healthy Diet <= 40 Therapeutic Level Cholesterol Diet  Flowsheet Row CARDIAC VIRTUAL BASED CARE from 07/06/2023 in [facility] CARDIAC REHABILITATION  Picture Your Plate  Total Score on Admission 50   Picture Your Plate Scores: <59 Unhealthy dietary pattern with much room for improvement. 41-50 Dietary pattern unlikely to meet recommendations for good health and room for improvement. 51-60 More healthful dietary pattern, with some room for improvement.  >60 Healthy dietary pattern, although there may be some specific behaviors that could be improved.    Nutrition Goals Re-Evaluation:  Nutrition Goals Re-Evaluation     Row Name 10/20/23 1557 11/01/23 1510           Goals   Nutrition Goal healthy eating --      Comment Bricelyn is doing well in rehab. She has just started Zepbound to help with weight. She stated that it has decreased her appitie and she does not want to eat very much. She is working with this and getting use to being on the shot. Jinelle is doing well in rehab. She is still on Zepbound to help with weight. She stated that she has started to get her appitie back and eating more then when she first started the zepbound.      Expected Outcome Short: find smaller portions of healthy foods you like to keep up with proteins   long term: focus on eating healthy options Short: pick healthier meals with small portions to help with weight lost   long: continue with healthy eating         Nutrition Goals Discharge (Final Nutrition Goals Re-Evaluation):  Nutrition Goals Re-Evaluation - 11/01/23 1510       Goals   Comment Laporshia is doing well in rehab. She is still on Zepbound to help with weight. She stated that she has started to get her appitie back and eating more then when she first started the zepbound.    Expected Outcome Short: pick healthier meals with small portions to help with weight lost   long: continue with healthy eating  Psychosocial: Target Goals: Acknowledge presence or absence of significant depression and/or stress, maximize coping skills, provide positive support system. Participant is able to verbalize types and  ability to use techniques and skills needed for reducing stress and depression.  Initial Review & Psychosocial Screening:   Quality of Life Scores:  Scores of 19 and below usually indicate a poorer quality of life in these areas.  A difference of  2-3 points is a clinically meaningful difference.  A difference of 2-3 points in the total score of the Quality of Life Index has been associated with significant improvement in overall quality of life, self-image, physical symptoms, and general health in studies assessing change in quality of life.  PHQ-9: Review Flowsheet       08/03/2023  Depression screen PHQ 2/9  Decreased Interest 0  Down, Depressed, Hopeless 0  PHQ - 2 Score 0  Altered sleeping 0  Tired, decreased energy 3  Change in appetite 0  Feeling bad or failure about yourself  0  Trouble concentrating 1  Moving slowly or fidgety/restless 0  Suicidal thoughts 0  PHQ-9 Score 4   Interpretation of Total Score  Total Score Depression Severity:  1-4 = Minimal depression, 5-9 = Mild depression, 10-14 = Moderate depression, 15-19 = Moderately severe depression, 20-27 = Severe depression   Psychosocial Evaluation and Intervention:   Psychosocial Re-Evaluation:  Psychosocial Re-Evaluation     Row Name 10/20/23 1555 11/01/23 1504           Psychosocial Re-Evaluation   Current issues with Current Stress Concerns;Current Sleep Concerns Current Stress Concerns;Current Sleep Concerns      Comments Dalilah is doing well in rehab. She has had stress and sleep issues due to having two childeren getting married this year and all the planning. She has one wedidng coming up in Auguest so she is stressing making sure everything is in order and her mind races at night some thinking about things that need to be done for the wedding. Ralyn continues to do well in rehab. She had one of her daughters bridal showers this past weekend so she has been worn from that. She is still stressed with  both daughters getting married this year. She  still says that her mind races at night thinking about things that need to be done so she does not sleep well.      Expected Outcomes Short; Find an outlet for stress when it comes to wedding planning, have a list do small list at a time to help decrease stress   Long: exercise to help with stress releif Short; Find an outlet for stress when it comes to wedding planning, have a list do small list at a time to help decrease stress   Long: exercise to help with stress releif      Interventions Encouraged to attend Cardiac Rehabilitation for the exercise Encouraged to attend Cardiac Rehabilitation for the exercise      Continue Psychosocial Services  Follow up required by staff Follow up required by staff         Psychosocial Discharge (Final Psychosocial Re-Evaluation):  Psychosocial Re-Evaluation - 11/01/23 1504       Psychosocial Re-Evaluation   Current issues with Current Stress Concerns;Current Sleep Concerns    Comments Salli continues to do well in rehab. She had one of her daughters bridal showers this past weekend so she has been worn from that. She is still stressed with both daughters getting married this year. She  still  says that her mind races at night thinking about things that need to be done so she does not sleep well.    Expected Outcomes Short; Find an outlet for stress when it comes to wedding planning, have a list do small list at a time to help decrease stress   Long: exercise to help with stress releif    Interventions Encouraged to attend Cardiac Rehabilitation for the exercise    Continue Psychosocial Services  Follow up required by staff          Vocational Rehabilitation: Provide vocational rehab assistance to qualifying candidates.   Vocational Rehab Evaluation & Intervention:   Education: Education Goals: Education classes will be provided on a weekly basis, covering required topics. Participant will state  understanding/return demonstration of topics presented.  Learning Barriers/Preferences:   Education Topics: Hypertension, Hypertension Reduction -Define heart disease and high blood pressure. Discus how high blood pressure affects the body and ways to reduce high blood pressure. Flowsheet Row CARDIAC REHAB PHASE II EXERCISE from 11/10/2023 in [place] CARDIAC REH[facility]ILITATION  Date 11/03/23  Educator dj  Instruction Review Code 1- Verbalizes Understanding    Exercise and Your Heart -Discuss why it is important to exercise, the FITT principles of exercise, normal and abnormal responses to exercise, and how to exercise safely.   Angina -Discuss definition of angina, causes of angina, treatment of angina, and how to decrease risk of having angina.   Cardiac Medications -Review what the following cardiac medications are used for, how they affect the body, and side effects that may occur when taking the medications.  Medications include Aspirin, Beta blockers, calcium  channel blockers, ACE Inhibitors, angiotensin receptor blockers, diuretics, digoxin, and antihyperlipidemics. Flowsheet Row CARDIAC REHAB PHASE II EXERCISE from 11/10/2023 in [place] CARDIAC REH[facility]ILITATION  Date 11/10/23  Educator DJ  Instruction Review Code 1- Verbalizes Understanding    Congestive Heart Failure -Discuss the definition of CHF, how to live with CHF, the signs and symptoms of CHF, and how keep track of weight and sodium intake.   Heart Disease and Intimacy -Discus the effect sexual activity has on the heart, how changes occur during intimacy as we age, and safety during sexual activity. Flowsheet Row CARDIAC REHAB PHASE II EXERCISE from 11/10/2023 in [place] CARDI[facility] RE[facility]ITATION  Date 10/13/23  Educator HB  Instruction Review Code 1- Verbalizes Understanding    Smoking Cessation / COPD -Discuss different methods to quit smoking, the health benefits of quitting smoking, and the definition of  COPD.   Nutrition I: Fats -Discuss the types of cholesterol, what cholesterol does to the heart, and how cholesterol levels can be controlled. Flowsheet Row CARDIAC REHAB PHASE II EXERCISE from 11/10/2023 in [place] CARDIAC REH[facility]ILITATION  Date 10/20/23  Educator [facility]  Instruction Review Code 1- Verbalizes Understanding    Nutrition II: Labels -Discuss the different components of food labels and how to read food label   Heart Parts/Heart Disease and PAD -Discuss the anatomy of the heart, the pathway of blood circulation through the heart, and these are affected by heart disease.   Stress I: Signs and Symptoms -Discuss the causes of stress, how stress may lead to anxiety and depression, and ways to limit stress. Flowsheet Row CARDIAC REHAB PHASE II EXERCISE from 11/10/2023 in [place] CARDIAC RE[facility]ITATION  Date 10/27/23  Educator HB  Instruction Review Code 1- Verbalizes Understanding    Stress II: Relaxation -Discuss different types of relaxation techniques to limit stress.   Warning Signs of Stroke /  TIA -Discuss definition of a stroke, what the signs and symptoms are of a stroke, and how to identify when someone is having stroke.   Knowledge Questionnaire Score:   Core Components/Risk Factors/Patient Goals at Admission:   Core Components/Risk Factors/Patient Goals Review:   Goals and Risk Factor Review     Row Name 10/20/23 1600 11/01/23 1511           Core Components/Risk Factors/Patient Goals Review   Personal Goals Review Weight Management/Obesity;Hypertension Weight Management/Obesity      Review Madilyne is doing well in rehab. We talked about her BP today. It has been lower in the past weeks and she has been feeling sluggish as well. She stated that her BP medication was increased by 1/2 and us  wodering if this is causing it to be lower. Informed her to contact her doctor about how she is feeling and giving her a printout of her BP to show the decrease is  BP Yulisa is doing well in rehab. She does not ckeck her BP daily. We talked last time about her BP being lower and her feeling sluggish; She did call her cardioloigist at decreased her metoperlol by 1/2 pill and has been feeling better. She is on Zepbound to help with weight loss.      Expected Outcomes Short: contact Card about BP  long: keep checking BP Short: take BP to keep track of numbers    long term: continue to exercise for weight managment and overall well being         Core Components/Risk Factors/Patient Goals at Discharge (Final Review):   Goals and Risk Factor Review - 11/01/23 1511       Core Components/Risk Factors/Patient Goals Review   Personal Goals Review Weight Management/Obesity    Review Damaya is doing well in rehab. She does not ckeck her BP daily. We talked last time about her BP being lower and her feeling sluggish; She did call her cardioloigist at decreased her metoperlol by 1/2 pill and has been feeling better. She is on Zepbound to help with weight loss.    Expected Outcomes Short: take BP to keep track of numbers    long term: continue to exercise for weight managment and overall well being          ITP Comments:  ITP Comments     Row Name 08/04/23 1524 08/09/23 0812 08/23/23 1528 08/31/23 1157 09/28/23 1052   ITP Comments Patient arrived for 1st visit/orientation/education at 1415. Patient was referred to CR by Dr. Mallipeddi due to Stable Angina. During orientation advised patient on arrival and appointment times what to wear, what to do before, during and after exercise. Reviewed attendance and class policy.  Pt is scheduled to return Cardiac Rehab on 08/04/23 at 1500. Pt was advised to come to class 15 minutes before class starts.  Discussed RPE/Dpysnea scales. Patient participated in warm up stretches. Patient was able to complete 6 minute walk test.  Telemetry:NSR with ST segment depression. Patient was measured for the equipment. Discussed equipment  safety with patient. Took patient pre-anthropometric measurements. Patient finished visit at 1300. Patient is on medical hold while medications are being updated. SHe has a follow up on 09/21/2023 and will see if she is cleared after follow up to resume cardiac rehab Continue to wait for clearance for after appt on 09/21/23 30 day review completed. ITP sent to Dr. Dorn Ross, Medical Director of Cardiac Rehab. Continue with ITP unless changes are made by physician.  Medical hold until f/u appt 09/21/23 30 day review completed. ITP sent to Dr. Dorn Ross, Medical Director of Cardiac Rehab. Continue with ITP unless changes are made by physician. Patient has been cleared to start the program but has not started yet. Plans to start 10/04/23.    Row Name 10/04/23 1458 10/26/23 0955 11/23/23 1258       ITP Comments First full day of exercise!  Patient was oriented to gym and equipment including functions, settings, policies, and procedures.  Patient's individual exercise prescription and treatment plan were reviewed.  All starting workloads were established based on the results of the 6 minute walk test done at initial orientation visit.  The plan for exercise progression was also introduced and progression will be customized based on patient's performance and goals. 30 day review completed. ITP sent to Dr. Dorn Ross, Medical Director of Cardiac Rehab. Continue with ITP unless changes are made by physician.  New to program. 30 day review completed. ITP sent to Dr. Dorn Ross, Medical Director of Cardiac Rehab. Continue with ITP unless changes are made by physician.        Comments: 30 day review

## 2023-11-24 ENCOUNTER — Encounter (HOSPITAL_COMMUNITY)

## 2023-11-29 ENCOUNTER — Encounter (HOSPITAL_COMMUNITY)

## 2023-12-01 ENCOUNTER — Encounter (HOSPITAL_COMMUNITY)

## 2023-12-07 DIAGNOSIS — M79672 Pain in left foot: Secondary | ICD-10-CM | POA: Diagnosis not present

## 2023-12-08 ENCOUNTER — Encounter (HOSPITAL_COMMUNITY): Payer: Self-pay

## 2023-12-16 DIAGNOSIS — M79672 Pain in left foot: Secondary | ICD-10-CM | POA: Diagnosis not present

## 2023-12-16 DIAGNOSIS — M7732 Calcaneal spur, left foot: Secondary | ICD-10-CM | POA: Diagnosis not present

## 2023-12-16 DIAGNOSIS — X58XXXA Exposure to other specified factors, initial encounter: Secondary | ICD-10-CM | POA: Diagnosis not present

## 2023-12-16 DIAGNOSIS — S92115A Nondisplaced fracture of neck of left talus, initial encounter for closed fracture: Secondary | ICD-10-CM | POA: Diagnosis not present

## 2023-12-16 DIAGNOSIS — S72432A Displaced fracture of medial condyle of left femur, initial encounter for closed fracture: Secondary | ICD-10-CM | POA: Diagnosis not present

## 2023-12-21 ENCOUNTER — Encounter (HOSPITAL_COMMUNITY): Payer: Self-pay | Admitting: *Deleted

## 2023-12-21 DIAGNOSIS — I2089 Other forms of angina pectoris: Secondary | ICD-10-CM

## 2023-12-21 NOTE — Progress Notes (Signed)
 Cardiac Individual Treatment Plan  Patient Details  Name: LOVINIA SNARE MRN: [id number] Date of Birth: 1965/09/12 Referring Provider:   Flowsheet Row CARDIAC REHAB PHASE II ORIENTATION from 08/03/2023 in [facility] CARDIAC REHABILITATION  Referring Provider Mallipeddi, Vishnu    Initial Encounter Date:  Flowsheet Row CARDIAC REHAB PHASE II ORIENTATION from 08/03/2023 in [place] CARDIAC REHABILITATION  Date 08/03/23    Visit Diagnosis: Stable angina (HCC)  Patient's Home Medications on Admission:  Current Outpatient Medications:    aspirin EC 81 MG tablet, Take 81 mg by mouth daily. Swallow whole., Disp: , Rfl:    calcium  carbonate (OSCAL) 1500 (600 Ca) MG TABS tablet, Take 600 mg of elemental calcium  by mouth 2 (two) times daily with a meal., Disp: , Rfl:    cetirizine (ZYRTEC) 10 MG tablet, Take 10 mg by mouth daily., Disp: , Rfl:    cholecalciferol (VITAMIN D3) 25 MCG (1000 UNIT) tablet, Take 1,000 Units by mouth 2 (two) times daily., Disp: , Rfl:    Evolocumab  (REPATHA  SURECLICK) 140 MG/ML SOAJ, INJECT 140MG  INTO THE SKIN EVERY 14 DAYS, Disp: 6 mL, Rfl: 1   famotidine  (PEPCID ) 40 MG tablet, Take 40 mg by mouth daily., Disp: , Rfl:    letrozole  (FEMARA ) 2.5 MG tablet, Take 1 tablet (2.5 mg total) by mouth daily., Disp: 90 tablet, Rfl: 3   levothyroxine (SYNTHROID) 50 MCG tablet, Take 50 mcg by mouth daily., Disp: , Rfl:    metoprolol  succinate (TOPROL  XL) 25 MG 24 hr tablet, Take 0.5 tablets (12.5 mg total) by mouth daily., Disp: 45 tablet, Rfl: 1   ranolazine  (RANEXA ) 500 MG 12 hr tablet, Take 1 tablet (500 mg total) by mouth 2 (two) times daily., Disp: 60 tablet, Rfl: 5   triamcinolone  cream (KENALOG) 0.1 %, , Disp: , Rfl:    ZEPBOUND 2.5 MG/0.5ML Pen, Inject 2.5 mg into the skin once a week., Disp: , Rfl:   Past Medical History: Past Medical History:  Diagnosis Date   Angio-edema    Cancer (HCC)    Family history of adverse reaction to anesthesia    Mother    Heart  murmur    Since childhood   Pre-diabetes    Patient states she was told she is prediabetic at yearly physical two months ago. Patient states her A1 C was around 5.6   Urticaria     Tobacco Use: Social History   Tobacco Use  Smoking Status Never  Smokeless Tobacco Never    Labs: Review Flowsheet       Latest Ref Rng & Units 08/06/2020  Labs for ITP Cardiac and Pulmonary Rehab  Hemoglobin A1c 4.8 - 5.6 % 5.5     Capillary Blood Glucose: Lab Results  Component Value Date   GLUCAP 124 (H) 08/30/2014     Exercise Target Goals: Exercise Program Goal: Individual exercise prescription set using results from initial 6 min walk test and THRR while considering  patient's activity barriers and safety.   Exercise Prescription Goal: Starting with aerobic activity 30 plus minutes a day, 3 days per week for initial exercise prescription. Provide home exercise prescription and guidelines that participant acknowledges understanding prior to discharge.  Activity Barriers & Risk Stratification:   6 Minute Walk:  6 Minute Walk     Row Name 08/03/23 1524         6 Minute Walk   Phase Initial     Distance 1140 feet     Walk Time 6 minutes     #  of Rest Breaks 0     MPH 2.16     METS 2.74     RPE 12     Perceived Dyspnea  2     VO2 Peak 9.6     Symptoms Yes (comment)     Comments 5/10 burning between shoulder blades     Resting HR 57 bpm     Resting BP 120/66     Resting Oxygen Saturation  96 %     Exercise Oxygen Saturation  during 6 min walk 96 %     Max Ex. HR 78 bpm     Max Ex. BP 122/68     2 Minute Post BP 118/62        Oxygen Initial Assessment:   Oxygen Re-Evaluation:   Oxygen Discharge (Final Oxygen Re-Evaluation):   Initial Exercise Prescription:  Initial Exercise Prescription - 08/03/23 1500       Date of Initial Exercise RX and Referring Provider   Date 08/03/23    Referring Provider Mallipeddi, Vishnu      Treadmill   MPH 1.8    Grade 0     Minutes 15    METs 2.38      REL-XR   Level 1    Speed 60    Minutes 15    METs 1.9      Prescription Details   Frequency (times per week) 2    Duration Progress to 30 minutes of continuous aerobic without signs/symptoms of physical distress      Intensity   THRR 40-80% of Max Heartrate 95-142    Ratings of Perceived Exertion 11-13    Perceived Dyspnea 0-4      Resistance Training   Training Prescription Yes    Weight 4    Reps 10-15          Perform Capillary Blood Glucose checks as needed.  Exercise Prescription Changes:   Exercise Prescription Changes     Row Name 08/03/23 1500 10/25/23 1500 11/08/23 1500 11/10/23 1500       Response to Exercise   Blood Pressure (Admit) 120/66 118/70 -- 110/60    Blood Pressure (Exercise) 122/68 -- -- --    Blood Pressure (Exit) 118/62 120/70 -- 98/60    Heart Rate (Admit) 57 bpm 55 bpm -- 60 bpm    Heart Rate (Exercise) 78 bpm 93 bpm -- 104 bpm    Heart Rate (Exit) 62 bpm 70 bpm -- 53 bpm    Oxygen Saturation (Admit) 96 % -- -- --    Oxygen Saturation (Exercise) 96 % -- -- --    Oxygen Saturation (Exit) 96 % -- -- --    Rating of Perceived Exertion (Exercise) 12 12 -- 12    Perceived Dyspnea (Exercise) 2 -- -- --    Symptoms buring in shoulder blands -- -- --    Duration -- Continue with 30 min of aerobic exercise without signs/symptoms of physical distress. -- Continue with 30 min of aerobic exercise without signs/symptoms of physical distress.    Intensity -- THRR unchanged -- THRR unchanged      Progression   Progression -- Continue to progress workloads to maintain intensity without signs/symptoms of physical distress. -- Continue to progress workloads to maintain intensity without signs/symptoms of physical distress.      Resistance Training   Training Prescription -- Yes -- Yes    Weight -- 4 -- 4    Reps -- 10-15 -- 10-15  Treadmill   MPH -- 2.5 -- 2.8    Grade -- 0 -- 1.5    Minutes -- 15 -- 15    METs  -- 2.91 -- 3.72      REL-XR   Level -- 2 -- 3    Speed -- 50 -- 63    Minutes -- 15 -- 15    METs -- 3.6 -- 3.8      Home Exercise Plan   Plans to continue exercise at -- -- Home (comment)  walking, gym equipment Home (comment)    Frequency -- -- Add 3 additional days to program exercise sessions. Add 3 additional days to program exercise sessions.    Initial Home Exercises Provided -- -- 11/08/23 --       Exercise Comments:   Exercise Comments     Row Name 10/04/23 1458           Exercise Comments First full day of exercise!  Patient was oriented to gym and equipment including functions, settings, policies, and procedures.  Patient's individual exercise prescription and treatment plan were reviewed.  All starting workloads were established based on the results of the 6 minute walk test done at initial orientation visit.  The plan for exercise progression was also introduced and progression will be customized based on patient's performance and goals.          Exercise Goals and Review:   Exercise Goals     Row Name 08/03/23 1534             Exercise Goals   Increase Physical Activity Yes       Intervention Provide advice, education, support and counseling about physical activity/exercise needs.;Develop an individualized exercise prescription for aerobic and resistive training based on initial evaluation findings, risk stratification, comorbidities and participant's personal goals.       Expected Outcomes Short Term: Attend rehab on a regular basis to increase amount of physical activity.;Long Term: Add in home exercise to make exercise part of routine and to increase amount of physical activity.;Long Term: Exercising regularly at least 3-5 days a week.       Increase Strength and Stamina Yes       Intervention Provide advice, education, support and counseling about physical activity/exercise needs.;Develop an individualized exercise prescription for aerobic and resistive  training based on initial evaluation findings, risk stratification, comorbidities and participant's personal goals.       Expected Outcomes Short Term: Perform resistance training exercises routinely during rehab and add in resistance training at home;Short Term: Increase workloads from initial exercise prescription for resistance, speed, and METs.;Long Term: Improve cardiorespiratory fitness, muscular endurance and strength as measured by increased METs and functional capacity ( )       Able to understand and use rate of perceived exertion (RPE) scale Yes       Intervention Provide education and explanation on how to use RPE scale       Expected Outcomes Short Term: Able to use RPE daily in rehab to express subjective intensity level;Long Term:  Able to use RPE to guide intensity level when exercising independently       Knowledge and understanding of Target Heart Rate Range (THRR) Yes       Intervention Provide education and explanation of THRR including how the numbers were predicted and where they are located for reference       Expected Outcomes Short Term: Able to state/look up THRR;Long Term: Able to use THRR to govern intensity  when exercising independently;Short Term: Able to use daily as guideline for intensity in rehab       Able to check pulse independently Yes       Intervention Provide education and demonstration on how to check pulse in carotid and radial arteries.;Review the importance of being able to check your own pulse for safety during independent exercise       Expected Outcomes Long Term: Able to check pulse independently and accurately;Short Term: Able to explain why pulse checking is important during independent exercise       Understanding of Exercise Prescription Yes       Intervention Provide education, explanation, and written materials on patient's individual exercise prescription       Expected Outcomes Short Term: Able to explain program exercise prescription;Long  Term: Able to explain home exercise prescription to exercise independently          Exercise Goals Re-Evaluation :  Exercise Goals Re-Evaluation     Row Name 10/04/23 1509 10/20/23 1552 11/01/23 1457 11/08/23 1515 12/08/23 0900     Exercise Goal Re-Evaluation   Exercise Goals Review Knowledge and understanding of Target Heart Rate Range (THRR);Able to understand and use rate of perceived exertion (RPE) scale Increase Strength and Stamina;Increase Physical Activity;Understanding of Exercise Prescription Increase Physical Activity;Increase Strength and Stamina;Understanding of Exercise Prescription Increase Physical Activity;Increase Strength and Stamina;Able to check pulse independently;Understanding of Exercise Prescription Increase Physical Activity;Increase Strength and Stamina;Understanding of Exercise Prescription   Comments Reviewed RPE and dyspnea scale, THR and program prescription with pt today.  Pt voiced understanding and was given a copy of goals to take home. Deriyah is doing well in rehab. She has noticed since starting that her endurance and increased some. She did have some burning when she statrted the program between her shoulder blades but it has eased up. She does have some burning if she increases her walking speed near the end of the 15 minutes but it does ease up. Kenyatta is doing well in rehab. She is on her 15th session and continues to notice increased endurance. She continues to have no symptoms when exericsing like she did at the oritentation walk test. She has not walked much outside of rehab due to the heat. Reviewed home exercise with pt today.  Pt plans to walk and use gym equipment at home for exercise.  Reviewed THR, pulse, RPE, sign and symptoms, pulse oximetery and when to call 911 or MD.  Also discussed weather considerations and indoor options.  Pt voiced understanding. Shaqueena is on hold from rehab. She broke her ankle and not sure on her next steps. Will follow up  when she is able to return.   Expected Outcomes Short: Use RPE daily to regulate intensity.  Long: Follow program prescription in THR. Short: increase some on speed  Long: contine to exericse Short: start exercisng at home  long term : continue to exerice at home Short: Start to add in more exercise at home Long: Conitnue to exercise independently Short: return to rehab   long: continue to exercise as able       Discharge Exercise Prescription (Final Exercise Prescription Changes):  Exercise Prescription Changes - 11/10/23 1500       Response to Exercise   Blood Pressure (Admit) 110/60    Blood Pressure (Exit) 98/60    Heart Rate (Admit) 60 bpm    Heart Rate (Exercise) 104 bpm    Heart Rate (Exit) 53 bpm    Rating of Perceived  Exertion (Exercise) 12    Duration Continue with 30 min of aerobic exercise without signs/symptoms of physical distress.    Intensity THRR unchanged      Progression   Progression Continue to progress workloads to maintain intensity without signs/symptoms of physical distress.      Resistance Training   Training Prescription Yes    Weight 4    Reps 10-15      Treadmill   MPH 2.8    Grade 1.5    Minutes 15    METs 3.72      REL-XR   Level 3    Speed 63    Minutes 15    METs 3.8      Home Exercise Plan   Plans to continue exercise at Home (comment)    Frequency Add 3 additional days to program exercise sessions.          Nutrition:  Target Goals: Understanding of nutrition guidelines, daily intake of sodium 1500mg , cholesterol 200mg , calories 30% from fat and 7% or less from saturated fats, daily to have 5 or more servings of fruits and vegetables.  Biometrics:  Pre Biometrics - 08/03/23 1535       Pre Biometrics   Height 5' 4 (1.626 m)    Waist Circumference 38.5 inches    Hip Circumference 44 inches    Waist to Hip Ratio 0.88 %    Grip Strength 23.4 kg    Single Leg Stand 25.46 seconds           Nutrition Therapy Plan and  Nutrition Goals:   Nutrition Assessments:  MEDIFICTS Score Key: >=70 Need to make dietary changes  40-70 Heart Healthy Diet <= 40 Therapeutic Level Cholesterol Diet  Flowsheet Row CARDIAC VIRTUAL BASED CARE from 07/06/2023 in [facility] CARDIAC REHABILITATION  Picture Your Plate Total Score on Admission 50   Picture Your Plate Scores: <59 Unhealthy dietary pattern with much room for improvement. 41-50 Dietary pattern unlikely to meet recommendations for good health and room for improvement. 51-60 More healthful dietary pattern, with some room for improvement.  >60 Healthy dietary pattern, although there may be some specific behaviors that could be improved.    Nutrition Goals Re-Evaluation:  Nutrition Goals Re-Evaluation     Row Name 10/20/23 1557 11/01/23 1510           Goals   Nutrition Goal healthy eating --      Comment Brittanyann is doing well in rehab. She has just started Zepbound to help with weight. She stated that it has decreased her appitie and she does not want to eat very much. She is working with this and getting use to being on the shot. Helayna is doing well in rehab. She is still on Zepbound to help with weight. She stated that she has started to get her appitie back and eating more then when she first started the zepbound.      Expected Outcome Short: find smaller portions of healthy foods you like to keep up with proteins   long term: focus on eating healthy options Short: pick healthier meals with small portions to help with weight lost   long: continue with healthy eating         Nutrition Goals Discharge (Final Nutrition Goals Re-Evaluation):  Nutrition Goals Re-Evaluation - 11/01/23 1510       Goals   Comment Norman is doing well in rehab. She is still on Zepbound to help with weight. She stated that she has  started to get her appitie back and eating more then when she first started the zepbound.    Expected Outcome Short: pick healthier meals with small  portions to help with weight lost   long: continue with healthy eating          Psychosocial: Target Goals: Acknowledge presence or absence of significant depression and/or stress, maximize coping skills, provide positive support system. Participant is able to verbalize types and ability to use techniques and skills needed for reducing stress and depression.  Initial Review & Psychosocial Screening:   Quality of Life Scores:  Scores of 19 and below usually indicate a poorer quality of life in these areas.  A difference of  2-3 points is a clinically meaningful difference.  A difference of 2-3 points in the total score of the Quality of Life Index has been associated with significant improvement in overall quality of life, self-image, physical symptoms, and general health in studies assessing change in quality of life.  PHQ-9: Review Flowsheet       08/03/2023  Depression screen PHQ 2/9  Decreased Interest 0  Down, Depressed, Hopeless 0  PHQ - 2 Score 0  Altered sleeping 0  Tired, decreased energy 3  Change in appetite 0  Feeling bad or failure about yourself  0  Trouble concentrating 1  Moving slowly or fidgety/restless 0  Suicidal thoughts 0  PHQ-9 Score 4   Interpretation of Total Score  Total Score Depression Severity:  1-4 = Minimal depression, 5-9 = Mild depression, 10-14 = Moderate depression, 15-19 = Moderately severe depression, 20-27 = Severe depression   Psychosocial Evaluation and Intervention:   Psychosocial Re-Evaluation:  Psychosocial Re-Evaluation     Row Name 10/20/23 1555 11/01/23 1504           Psychosocial Re-Evaluation   Current issues with Current Stress Concerns;Current Sleep Concerns Current Stress Concerns;Current Sleep Concerns      Comments Janiah is doing well in rehab. She has had stress and sleep issues due to having two childeren getting married this year and all the planning. She has one wedidng coming up in Auguest so she is stressing  making sure everything is in order and her mind races at night some thinking about things that need to be done for the wedding. Marlaine continues to do well in rehab. She had one of her daughters bridal showers this past weekend so she has been worn from that. She is still stressed with both daughters getting married this year. She  still says that her mind races at night thinking about things that need to be done so she does not sleep well.      Expected Outcomes Short; Find an outlet for stress when it comes to wedding planning, have a list do small list at a time to help decrease stress   Long: exercise to help with stress releif Short; Find an outlet for stress when it comes to wedding planning, have a list do small list at a time to help decrease stress   Long: exercise to help with stress releif      Interventions Encouraged to attend Cardiac Rehabilitation for the exercise Encouraged to attend Cardiac Rehabilitation for the exercise      Continue Psychosocial Services  Follow up required by staff Follow up required by staff         Psychosocial Discharge (Final Psychosocial Re-Evaluation):  Psychosocial Re-Evaluation - 11/01/23 1504       Psychosocial Re-Evaluation   Current issues  with Current Stress Concerns;Current Sleep Concerns    Comments Zoiee continues to do well in rehab. She had one of her daughters bridal showers this past weekend so she has been worn from that. She is still stressed with both daughters getting married this year. She  still says that her mind races at night thinking about things that need to be done so she does not sleep well.    Expected Outcomes Short; Find an outlet for stress when it comes to wedding planning, have a list do small list at a time to help decrease stress   Long: exercise to help with stress releif    Interventions Encouraged to attend Cardiac Rehabilitation for the exercise    Continue Psychosocial Services  Follow up required by staff           Vocational Rehabilitation: Provide vocational rehab assistance to qualifying candidates.   Vocational Rehab Evaluation & Intervention:   Education: Education Goals: Education classes will be provided on a weekly basis, covering required topics. Participant will state understanding/return demonstration of topics presented.  Learning Barriers/Preferences:   Education Topics: Hypertension, Hypertension Reduction -Define heart disease and high blood pressure. Discus how high blood pressure affects the body and ways to reduce high blood pressure. Flowsheet Row CARDIAC REHAB PHASE II EXERCISE from 11/10/2023 in [place] CARDIAC REHABILITATION  Date 11/03/23  Educator dj  Instruction Review Code 1- Verbalizes Understanding    Exercise and Your Heart -Discuss why it is important to exercise, the FITT principles of exercise, normal and abnormal responses to exercise, and how to exercise safely.   Angina -Discuss definition of angina, causes of angina, treatment of angina, and how to decrease risk of having angina.   Cardiac Medications -Review what the following cardiac medications are used for, how they affect the body, and side effects that may occur when taking the medications.  Medications include Aspirin, Beta blockers, calcium  channel blockers, ACE Inhibitors, angiotensin receptor blockers, diuretics, digoxin, and antihyperlipidemics. Flowsheet Row CARDIAC REHAB PHASE II EXERCISE from 11/10/2023 in [place] CARDIAC REHABILITATION  Date 11/10/23  Educator DJ  Instruction Review Code 1- Verbalizes Understanding    Congestive Heart Failure -Discuss the definition of CHF, how to live with CHF, the signs and symptoms of CHF, and how keep track of weight and sodium intake.   Heart Disease and Intimacy -Discus the effect sexual activity has on the heart, how changes occur during intimacy as we age, and safety during sexual activity. Flowsheet Row CARDIAC REHAB PHASE II  EXERCISE from 11/10/2023 in [place] CARDIAC REHABILITATION  Date 10/13/23  Educator HB  Instruction Review Code 1- Verbalizes Understanding    Smoking Cessation / COPD -Discuss different methods to quit smoking, the health benefits of quitting smoking, and the definition of COPD.   Nutrition I: Fats -Discuss the types of cholesterol, what cholesterol does to the heart, and how cholesterol levels can be controlled. Flowsheet Row CARDIAC REHAB PHASE II EXERCISE from 11/10/2023 in [place] CARDIAC REH[facility]ILITATION  Date 10/20/23  Educator [facility]  Instruction Review Code 1- Verbalizes Understanding    Nutrition II: Labels -Discuss the different components of food labels and how to read food label   Heart Parts/Heart Disease and PAD -Discuss the anatomy of the heart, the pathway of blood circulation through the heart, and these are affected by heart disease.   Stress I: Signs and Symptoms -Discuss the causes of stress, how stress may lead to anxiety and depression, and ways to limit stress.  Flowsheet Row CARDIAC REHAB PHASE II EXERCISE from 11/10/2023 in [place] CARDIAC REHABILITATION  Date 10/27/23  Educator HB  Instruction Review Code 1- Verbalizes Understanding    Stress II: Relaxation -Discuss different types of relaxation techniques to limit stress.   Warning Signs of Stroke / TIA -Discuss definition of a stroke, what the signs and symptoms are of a stroke, and how to identify when someone is having stroke.   Knowledge Questionnaire Score:   Core Components/Risk Factors/Patient Goals at Admission:   Core Components/Risk Factors/Patient Goals Review:   Goals and Risk Factor Review     Row Name 10/20/23 1600 11/01/23 1511           Core Components/Risk Factors/Patient Goals Review   Personal Goals Review Weight Management/Obesity;Hypertension Weight Management/Obesity      Review Marium is doing well in rehab. We talked about her BP today. It has been lower in  the past weeks and she has been feeling sluggish as well. She stated that her BP medication was increased by 1/2 and us  wodering if this is causing it to be lower. Informed her to contact her doctor about how she is feeling and giving her a printout of her BP to show the decrease is BP Lexis is doing well in rehab. She does not ckeck her BP daily. We talked last time about her BP being lower and her feeling sluggish; She did call her cardioloigist at decreased her metoperlol by 1/2 pill and has been feeling better. She is on Zepbound to help with weight loss.      Expected Outcomes Short: contact Card about BP  long: keep checking BP Short: take BP to keep track of numbers    long term: continue to exercise for weight managment and overall well being         Core Components/Risk Factors/Patient Goals at Discharge (Final Review):   Goals and Risk Factor Review - 11/01/23 1511       Core Components/Risk Factors/Patient Goals Review   Personal Goals Review Weight Management/Obesity    Review Pina is doing well in rehab. She does not ckeck her BP daily. We talked last time about her BP being lower and her feeling sluggish; She did call her cardioloigist at decreased her metoperlol by 1/2 pill and has been feeling better. She is on Zepbound to help with weight loss.    Expected Outcomes Short: take BP to keep track of numbers    long term: continue to exercise for weight managment and overall well being          ITP Comments:  ITP Comments     Row Name 08/04/23 1524 08/09/23 0812 08/23/23 1528 08/31/23 1157 09/28/23 1052   ITP Comments Patient arrived for 1st visit/orientation/education at 1415. Patient was referred to CR by Dr. Mallipeddi due to Stable Angina. During orientation advised patient on arrival and appointment times what to wear, what to do before, during and after exercise. Reviewed attendance and class policy.  Pt is scheduled to return Cardiac Rehab on 08/04/23 at 1500. Pt was  advised to come to class 15 minutes before class starts.  Discussed RPE/Dpysnea scales. Patient participated in warm up stretches. Patient was able to complete 6 minute walk test.  Telemetry:NSR with ST segment depression. Patient was measured for the equipment. Discussed equipment safety with patient. Took patient pre-anthropometric measurements. Patient finished visit at 1300. Patient is on medical hold while medications are being updated. SHe has a follow up on 09/21/2023  and will see if she is cleared after follow up to resume cardiac rehab Continue to wait for clearance for after appt on 09/21/23 30 day review completed. ITP sent to Dr. Dorn Ross, Medical Director of Cardiac Rehab. Continue with ITP unless changes are made by physician.  Medical hold until f/u appt 09/21/23 30 day review completed. ITP sent to Dr. Dorn Ross, Medical Director of Cardiac Rehab. Continue with ITP unless changes are made by physician. Patient has been cleared to start the program but has not started yet. Plans to start 10/04/23.    Row Name 10/04/23 1458 10/26/23 0955 11/23/23 1258 12/21/23 1545     ITP Comments First full day of exercise!  Patient was oriented to gym and equipment including functions, settings, policies, and procedures.  Patient's individual exercise prescription and treatment plan were reviewed.  All starting workloads were established based on the results of the 6 minute walk test done at initial orientation visit.  The plan for exercise progression was also introduced and progression will be customized based on patient's performance and goals. 30 day review completed. ITP sent to Dr. Dorn Ross, Medical Director of Cardiac Rehab. Continue with ITP unless changes are made by physician.  New to program. 30 day review completed. ITP sent to Dr. Dorn Ross, Medical Director of Cardiac Rehab. Continue with ITP unless changes are made by physician. 30 day review completed. ITP sent to Dr.  Dorn Ross, Medical Director of Cardiac Rehab. Continue with ITP unless changes are made by physician.  Currently out on medical hold with foot in boot for broken ankle       Comments: 30 day review

## 2024-01-02 ENCOUNTER — Other Ambulatory Visit (HOSPITAL_COMMUNITY): Payer: Self-pay | Admitting: Orthopaedic Surgery

## 2024-01-02 ENCOUNTER — Ambulatory Visit (HOSPITAL_COMMUNITY)
Admission: RE | Admit: 2024-01-02 | Discharge: 2024-01-02 | Disposition: A | Discharge status: Home / Self Care | Source: Ambulatory Visit | Attending: Orthopaedic Surgery | Admitting: Orthopaedic Surgery

## 2024-01-02 DIAGNOSIS — S92102A Unspecified fracture of left talus, initial encounter for closed fracture: Secondary | ICD-10-CM | POA: Insufficient documentation

## 2024-01-02 DIAGNOSIS — M898X8 Other specified disorders of bone, other site: Secondary | ICD-10-CM | POA: Diagnosis not present

## 2024-01-02 DIAGNOSIS — M25572 Pain in left ankle and joints of left foot: Secondary | ICD-10-CM | POA: Diagnosis not present

## 2024-01-10 DIAGNOSIS — R739 Hyperglycemia, unspecified: Secondary | ICD-10-CM | POA: Diagnosis not present

## 2024-01-10 DIAGNOSIS — E559 Vitamin D deficiency, unspecified: Secondary | ICD-10-CM | POA: Diagnosis not present

## 2024-01-10 DIAGNOSIS — E7849 Other hyperlipidemia: Secondary | ICD-10-CM | POA: Diagnosis not present

## 2024-01-10 LAB — LAB REPORT - SCANNED
EGFR: 60.9
TSH: 3.7 (ref 0.41–5.90)

## 2024-01-18 ENCOUNTER — Encounter (HOSPITAL_COMMUNITY): Payer: Self-pay | Admitting: *Deleted

## 2024-01-18 DIAGNOSIS — I2089 Other forms of angina pectoris: Secondary | ICD-10-CM

## 2024-01-18 NOTE — Progress Notes (Signed)
 Cardiac Individual Treatment Plan  Patient Details  Name: Holly Allison MRN: [id number] Date of Birth: April 05, 1966 Referring Provider:   Flowsheet Row CARDIAC REHAB PHASE II ORIENTATION from 08/03/2023 in [facility]  Referring Provider Mallipeddi, Vishnu    Initial Encounter Date:  Flowsheet Row CARDIAC REHAB PHASE II ORIENTATION from 08/03/2023 in [place] [facility]  Date 08/03/23    Visit Diagnosis: Stable angina  Patient's Home Medications on Admission:  Current Outpatient Medications:    aspirin EC 81 MG tablet, Take 81 mg by mouth daily. Swallow whole., Disp: , Rfl:    calcium  carbonate (OSCAL) 1500 (600 Ca) MG TABS tablet, Take 600 mg of elemental calcium  by mouth 2 (two) times daily with a meal., Disp: , Rfl:    cetirizine (ZYRTEC) 10 MG tablet, Take 10 mg by mouth daily., Disp: , Rfl:    cholecalciferol (VITAMIN D3) 25 MCG (1000 UNIT) tablet, Take 1,000 Units by mouth 2 (two) times daily., Disp: , Rfl:    Evolocumab  (REPATHA  SURECLICK) 140 MG/ML SOAJ, INJECT 140MG  INTO THE SKIN EVERY 14 DAYS, Disp: 6 mL, Rfl: 1   famotidine  (PEPCID ) 40 MG tablet, Take 40 mg by mouth daily., Disp: , Rfl:    letrozole  (FEMARA ) 2.5 MG tablet, Take 1 tablet (2.5 mg total) by mouth daily., Disp: 90 tablet, Rfl: 3   levothyroxine (SYNTHROID) 50 MCG tablet, Take 50 mcg by mouth daily., Disp: , Rfl:    metoprolol  succinate (TOPROL  XL) 25 MG 24 hr tablet, Take 0.5 tablets (12.5 mg total) by mouth daily., Disp: 45 tablet, Rfl: 1   ranolazine  (RANEXA ) 500 MG 12 hr tablet, Take 1 tablet (500 mg total) by mouth 2 (two) times daily., Disp: 60 tablet, Rfl: 5   triamcinolone  cream (KENALOG) 0.1 %, , Disp: , Rfl:    ZEPBOUND 2.5 MG/0.5ML Pen, Inject 2.5 mg into the skin once a week., Disp: , Rfl:   Past Medical History: Past Medical History:  Diagnosis Date   Angio-edema    Cancer (HCC)    Family history of adverse reaction to anesthesia    Mother    Heart murmur     Since childhood   Pre-diabetes    Patient states she was told she is prediabetic at yearly physical two months ago. Patient states her A1 C was around 5.6   Urticaria     Tobacco Use: Social History   Tobacco Use  Smoking Status Never  Smokeless Tobacco Never    Labs: Review Flowsheet       Latest Ref Rng & Units 08/06/2020  Labs for ITP Cardiac and Pulmonary Rehab  Hemoglobin A1c 4.8 - 5.6 % 5.5     Capillary Blood Glucose: Lab Results  Component Value Date   GLUCAP 124 (H) 08/30/2014     Exercise Target Goals: Exercise Program Goal: Individual exercise prescription set using results from initial 6 min walk test and THRR while considering  patient's activity barriers and safety.   Exercise Prescription Goal: Starting with aerobic activity 30 plus minutes a day, 3 days per week for initial exercise prescription. Provide home exercise prescription and guidelines that participant acknowledges understanding prior to discharge.  Activity Barriers & Risk Stratification:   6 Minute Walk:  6 Minute Walk     Row Name 08/03/23 1524         6 Minute Walk   Phase Initial     Distance 1140 feet     Walk Time 6 minutes     #  of Rest Breaks 0     MPH 2.16     METS 2.74     RPE 12     Perceived Dyspnea  2     VO2 Peak 9.6     Symptoms Yes (comment)     Comments 5/10 burning between shoulder blades     Resting HR 57 bpm     Resting BP 120/66     Resting Oxygen Saturation  96 %     Exercise Oxygen Saturation  during 6 min walk 96 %     Max Ex. HR 78 bpm     Max Ex. BP 122/68     2 Minute Post BP 118/62        Oxygen Initial Assessment:   Oxygen Re-Evaluation:   Oxygen Discharge (Final Oxygen Re-Evaluation):   Initial Exercise Prescription:  Initial Exercise Prescription - 08/03/23 1500       Date of Initial Exercise RX and Referring Provider   Date 08/03/23    Referring Provider Mallipeddi, Vishnu      Treadmill   MPH 1.8    Grade 0    Minutes  15    METs 2.38      REL-XR   Level 1    Speed 60    Minutes 15    METs 1.9      Prescription Details   Frequency (times per week) 2    Duration Progress to 30 minutes of continuous aerobic without signs/symptoms of physical distress      Intensity   THRR 40-80% of Max Heartrate 95-142    Ratings of Perceived Exertion 11-13    Perceived Dyspnea 0-4      Resistance Training   Training Prescription Yes    Weight 4    Reps 10-15          Perform Capillary Blood Glucose checks as needed.  Exercise Prescription Changes:   Exercise Prescription Changes     Row Name 08/03/23 1500 10/25/23 1500 11/08/23 1500 11/10/23 1500       Response to Exercise   Blood Pressure (Admit) 120/66 118/70 -- 110/60    Blood Pressure (Exercise) 122/68 -- -- --    Blood Pressure (Exit) 118/62 120/70 -- 98/60    Heart Rate (Admit) 57 bpm 55 bpm -- 60 bpm    Heart Rate (Exercise) 78 bpm 93 bpm -- 104 bpm    Heart Rate (Exit) 62 bpm 70 bpm -- 53 bpm    Oxygen Saturation (Admit) 96 % -- -- --    Oxygen Saturation (Exercise) 96 % -- -- --    Oxygen Saturation (Exit) 96 % -- -- --    Rating of Perceived Exertion (Exercise) 12 12 -- 12    Perceived Dyspnea (Exercise) 2 -- -- --    Symptoms buring in shoulder blands -- -- --    Duration -- Continue with 30 min of aerobic exercise without signs/symptoms of physical distress. -- Continue with 30 min of aerobic exercise without signs/symptoms of physical distress.    Intensity -- THRR unchanged -- THRR unchanged      Progression   Progression -- Continue to progress workloads to maintain intensity without signs/symptoms of physical distress. -- Continue to progress workloads to maintain intensity without signs/symptoms of physical distress.      Resistance Training   Training Prescription -- Yes -- Yes    Weight -- 4 -- 4    Reps -- 10-15 -- 10-15  Treadmill   MPH -- 2.5 -- 2.8    Grade -- 0 -- 1.5    Minutes -- 15 -- 15    METs -- 2.91  -- 3.72      REL-XR   Level -- 2 -- 3    Speed -- 50 -- 63    Minutes -- 15 -- 15    METs -- 3.6 -- 3.8      Home Exercise Plan   Plans to continue exercise at -- -- Home (comment)  walking, gym equipment Home (comment)    Frequency -- -- Add 3 additional days to program exercise sessions. Add 3 additional days to program exercise sessions.    Initial Home Exercises Provided -- -- 11/08/23 --       Exercise Comments:   Exercise Comments     Row Name 10/04/23 1458           Exercise Comments First full day of exercise!  Patient was oriented to gym and equipment including functions, settings, policies, and procedures.  Patient's individual exercise prescription and treatment plan were reviewed.  All starting workloads were established based on the results of the 6 minute walk test done at initial orientation visit.  The plan for exercise progression was also introduced and progression will be customized based on patient's performance and goals.          Exercise Goals and Review:   Exercise Goals     Row Name 08/03/23 1534             Exercise Goals   Increase Physical Activity Yes       Intervention Provide advice, education, support and counseling about physical activity/exercise needs.;Develop an individualized exercise prescription for aerobic and resistive training based on initial evaluation findings, risk stratification, comorbidities and participant's personal goals.       Expected Outcomes Short Term: Attend rehab on a regular basis to increase amount of physical activity.;Long Term: Add in home exercise to make exercise part of routine and to increase amount of physical activity.;Long Term: Exercising regularly at least 3-5 days a week.       Increase Strength and Stamina Yes       Intervention Provide advice, education, support and counseling about physical activity/exercise needs.;Develop an individualized exercise prescription for aerobic and resistive training  based on initial evaluation findings, risk stratification, comorbidities and participant's personal goals.       Expected Outcomes Short Term: Perform resistance training exercises routinely during rehab and add in resistance training at home;Short Term: Increase workloads from initial exercise prescription for resistance, speed, and METs.;Long Term: Improve cardiorespiratory fitness, muscular endurance and strength as measured by increased METs and functional capacity ( )       Able to understand and use rate of perceived exertion (RPE) scale Yes       Intervention Provide education and explanation on how to use RPE scale       Expected Outcomes Short Term: Able to use RPE daily in rehab to express subjective intensity level;Long Term:  Able to use RPE to guide intensity level when exercising independently       Knowledge and understanding of Target Heart Rate Range (THRR) Yes       Intervention Provide education and explanation of THRR including how the numbers were predicted and where they are located for reference       Expected Outcomes Short Term: Able to state/look up THRR;Long Term: Able to use THRR to govern intensity  when exercising independently;Short Term: Able to use daily as guideline for intensity in rehab       Able to check pulse independently Yes       Intervention Provide education and demonstration on how to check pulse in carotid and radial arteries.;Review the importance of being able to check your own pulse for safety during independent exercise       Expected Outcomes Long Term: Able to check pulse independently and accurately;Short Term: Able to explain why pulse checking is important during independent exercise       Understanding of Exercise Prescription Yes       Intervention Provide education, explanation, and written materials on patient's individual exercise prescription       Expected Outcomes Short Term: Able to explain program exercise prescription;Long Term: Able to  explain home exercise prescription to exercise independently          Exercise Goals Re-Evaluation :  Exercise Goals Re-Evaluation     Row Name 10/04/23 1509 10/20/23 1552 11/01/23 1457 11/08/23 1515 12/08/23 0900     Exercise Goal Re-Evaluation   Exercise Goals Review Knowledge and understanding of Target Heart Rate Range (THRR);Able to understand and use rate of perceived exertion (RPE) scale Increase Strength and Stamina;Increase Physical Activity;Understanding of Exercise Prescription Increase Physical Activity;Increase Strength and Stamina;Understanding of Exercise Prescription Increase Physical Activity;Increase Strength and Stamina;Able to check pulse independently;Understanding of Exercise Prescription Increase Physical Activity;Increase Strength and Stamina;Understanding of Exercise Prescription   Comments Reviewed RPE and dyspnea scale, THR and program prescription with pt today.  Pt voiced understanding and was given a copy of goals to take home. Sundae is doing well in rehab. She has noticed since starting that her endurance and increased some. She did have some burning when she statrted the program between her shoulder blades but it has eased up. She does have some burning if she increases her walking speed near the end of the 15 minutes but it does ease up. Arletha is doing well in rehab. She is on her 15th session and continues to notice increased endurance. She continues to have no symptoms when exericsing like she did at the oritentation walk test. She has not walked much outside of rehab due to the heat. Reviewed home exercise with pt today.  Pt plans to walk and use gym equipment at home for exercise.  Reviewed THR, pulse, RPE, sign and symptoms, pulse oximetery and when to call 911 or MD.  Also discussed weather considerations and indoor options.  Pt voiced understanding. Jereline is on hold from rehab. She broke her ankle and not sure on her next steps. Will follow up when she is able  to return.   Expected Outcomes Short: Use RPE daily to regulate intensity.  Long: Follow program prescription in THR. Short: increase some on speed  Long: contine to exericse Short: start exercisng at home  long term : continue to exerice at home Short: Start to add in more exercise at home Long: Conitnue to exercise independently Short: return to rehab   long: continue to exercise as able       Discharge Exercise Prescription (Final Exercise Prescription Changes):  Exercise Prescription Changes - 11/10/23 1500       Response to Exercise   Blood Pressure (Admit) 110/60    Blood Pressure (Exit) 98/60    Heart Rate (Admit) 60 bpm    Heart Rate (Exercise) 104 bpm    Heart Rate (Exit) 53 bpm    Rating of Perceived  Exertion (Exercise) 12    Duration Continue with 30 min of aerobic exercise without signs/symptoms of physical distress.    Intensity THRR unchanged      Progression   Progression Continue to progress workloads to maintain intensity without signs/symptoms of physical distress.      Resistance Training   Training Prescription Yes    Weight 4    Reps 10-15      Treadmill   MPH 2.8    Grade 1.5    Minutes 15    METs 3.72      REL-XR   Level 3    Speed 63    Minutes 15    METs 3.8      Home Exercise Plan   Plans to continue exercise at Home (comment)    Frequency Add 3 additional days to program exercise sessions.          Nutrition:  Target Goals: Understanding of nutrition guidelines, daily intake of sodium 1500mg , cholesterol 200mg , calories 30% from fat and 7% or less from saturated fats, daily to have 5 or more servings of fruits and vegetables.  Biometrics:  Pre Biometrics - 08/03/23 1535       Pre Biometrics   Height 5' 4 (1.626 m)    Waist Circumference 38.5 inches    Hip Circumference 44 inches    Waist to Hip Ratio 0.88 %    Grip Strength 23.4 kg    Single Leg Stand 25.46 seconds           Nutrition Therapy Plan and Nutrition  Goals:   Nutrition Assessments:  MEDIFICTS Score Key: >=70 Need to make dietary changes  40-70 Heart Healthy Diet <= 40 Therapeutic Level Cholesterol Diet  Flowsheet Row CARDIAC VIRTUAL BASED CARE from 07/06/2023 in [facility]  Picture Your Plate Total Score on Admission 50   Picture Your Plate Scores: <59 Unhealthy dietary pattern with much room for improvement. 41-50 Dietary pattern unlikely to meet recommendations for good health and room for improvement. 51-60 More healthful dietary pattern, with some room for improvement.  >60 Healthy dietary pattern, although there may be some specific behaviors that could be improved.    Nutrition Goals Re-Evaluation:  Nutrition Goals Re-Evaluation     Row Name 10/20/23 1557 11/01/23 1510           Goals   Nutrition Goal healthy eating --      Comment Tavaria is doing well in rehab. She has just started Zepbound to help with weight. She stated that it has decreased her appitie and she does not want to eat very much. She is working with this and getting use to being on the shot. Nasirah is doing well in rehab. She is still on Zepbound to help with weight. She stated that she has started to get her appitie back and eating more then when she first started the zepbound.      Expected Outcome Short: find smaller portions of healthy foods you like to keep up with proteins   long term: focus on eating healthy options Short: pick healthier meals with small portions to help with weight lost   long: continue with healthy eating         Nutrition Goals Discharge (Final Nutrition Goals Re-Evaluation):  Nutrition Goals Re-Evaluation - 11/01/23 1510       Goals   Comment Belita is doing well in rehab. She is still on Zepbound to help with weight. She stated that she has  started to get her appitie back and eating more then when she first started the zepbound.    Expected Outcome Short: pick healthier meals with small portions  to help with weight lost   long: continue with healthy eating          Psychosocial: Target Goals: Acknowledge presence or absence of significant depression and/or stress, maximize coping skills, provide positive support system. Participant is able to verbalize types and ability to use techniques and skills needed for reducing stress and depression.  Initial Review & Psychosocial Screening:   Quality of Life Scores:  Scores of 19 and below usually indicate a poorer quality of life in these areas.  A difference of  2-3 points is a clinically meaningful difference.  A difference of 2-3 points in the total score of the Quality of Life Index has been associated with significant improvement in overall quality of life, self-image, physical symptoms, and general health in studies assessing change in quality of life.  PHQ-9: Review Flowsheet       08/03/2023  Depression screen PHQ 2/9  Decreased Interest 0  Down, Depressed, Hopeless 0  PHQ - 2 Score 0  Altered sleeping 0  Tired, decreased energy 3  Change in appetite 0  Feeling bad or failure about yourself  0  Trouble concentrating 1  Moving slowly or fidgety/restless 0  Suicidal thoughts 0  PHQ-9 Score 4   Interpretation of Total Score  Total Score Depression Severity:  1-4 = Minimal depression, 5-9 = Mild depression, 10-14 = Moderate depression, 15-19 = Moderately severe depression, 20-27 = Severe depression   Psychosocial Evaluation and Intervention:   Psychosocial Re-Evaluation:  Psychosocial Re-Evaluation     Row Name 10/20/23 1555 11/01/23 1504           Psychosocial Re-Evaluation   Current issues with Current Stress Concerns;Current Sleep Concerns Current Stress Concerns;Current Sleep Concerns      Comments Tiegan is doing well in rehab. She has had stress and sleep issues due to having two childeren getting married this year and all the planning. She has one wedidng coming up in Auguest so she is stressing making  sure everything is in order and her mind races at night some thinking about things that need to be done for the wedding. Claretha continues to do well in rehab. She had one of her daughters bridal showers this past weekend so she has been worn from that. She is still stressed with both daughters getting married this year. She  still says that her mind races at night thinking about things that need to be done so she does not sleep well.      Expected Outcomes Short; Find an outlet for stress when it comes to wedding planning, have a list do small list at a time to help decrease stress   Long: exercise to help with stress releif Short; Find an outlet for stress when it comes to wedding planning, have a list do small list at a time to help decrease stress   Long: exercise to help with stress releif      Interventions Encouraged to attend Cardiac Rehabilitation for the exercise Encouraged to attend Cardiac Rehabilitation for the exercise      Continue Psychosocial Services  Follow up required by staff Follow up required by staff         Psychosocial Discharge (Final Psychosocial Re-Evaluation):  Psychosocial Re-Evaluation - 11/01/23 1504       Psychosocial Re-Evaluation   Current issues  with Current Stress Concerns;Current Sleep Concerns    Comments Laurelle continues to do well in rehab. She had one of her daughters bridal showers this past weekend so she has been worn from that. She is still stressed with both daughters getting married this year. She  still says that her mind races at night thinking about things that need to be done so she does not sleep well.    Expected Outcomes Short; Find an outlet for stress when it comes to wedding planning, have a list do small list at a time to help decrease stress   Long: exercise to help with stress releif    Interventions Encouraged to attend Cardiac Rehabilitation for the exercise    Continue Psychosocial Services  Follow up required by staff           Vocational Rehabilitation: Provide vocational rehab assistance to qualifying candidates.   Vocational Rehab Evaluation & Intervention:   Education: Education Goals: Education classes will be provided on a weekly basis, covering required topics. Participant will state understanding/return demonstration of topics presented.  Learning Barriers/Preferences:   Education Topics: Hypertension, Hypertension Reduction -Define heart disease and high blood pressure. Discus how high blood pressure affects the body and ways to reduce high blood pressure. Flowsheet Row CARDIAC REHAB PHASE II EXERCISE from 11/10/2023 in [place] [facility]  Date 11/03/23  Educator dj  Instruction Review Code 1- Verbalizes Understanding    Exercise and Your Heart -Discuss why it is important to exercise, the FITT principles of exercise, normal and abnormal responses to exercise, and how to exercise safely.   Angina -Discuss definition of angina, causes of angina, treatment of angina, and how to decrease risk of having angina.   Cardiac Medications -Review what the following cardiac medications are used for, how they affect the body, and side effects that may occur when taking the medications.  Medications include Aspirin, Beta blockers, calcium  channel blockers, ACE Inhibitors, angiotensin receptor blockers, diuretics, digoxin, and antihyperlipidemics. Flowsheet Row CARDIAC REHAB PHASE II EXERCISE from 11/10/2023 in [place] [facility]  Date 11/10/23  Educator DJ  Instruction Review Code 1- Verbalizes Understanding    Congestive Heart Failure -Discuss the definition of CHF, how to live with CHF, the signs and symptoms of CHF, and how keep track of weight and sodium intake.   Heart Disease and Intimacy -Discus the effect sexual activity has on the heart, how changes occur during intimacy as we age, and safety during sexual activity. Flowsheet Row CARDIAC REHAB PHASE II  EXERCISE from 11/10/2023 in [place] [facility]  Date 10/13/23  Educator HB  Instruction Review Code 1- Verbalizes Understanding    Smoking Cessation / COPD -Discuss different methods to quit smoking, the health benefits of quitting smoking, and the definition of COPD.   Nutrition I: Fats -Discuss the types of cholesterol, what cholesterol does to the heart, and how cholesterol levels can be controlled. Flowsheet Row CARDIAC REHAB PHASE II EXERCISE from 11/10/2023 in [place] [facility]  Date 10/20/23  Educator [facility]  Instruction Review Code 1- Verbalizes Understanding    Nutrition II: Labels -Discuss the different components of food labels and how to read food label   Heart Parts/Heart Disease and PAD -Discuss the anatomy of the heart, the pathway of blood circulation through the heart, and these are affected by heart disease.   Stress I: Signs and Symptoms -Discuss the causes of stress, how stress may lead to anxiety and depression, and ways to limit stress.  Flowsheet Row CARDIAC REHAB PHASE II EXERCISE from 11/10/2023 in [place] [facility]  Date 10/27/23  Educator HB  Instruction Review Code 1- Verbalizes Understanding    Stress II: Relaxation -Discuss different types of relaxation techniques to limit stress.   Warning Signs of Stroke / TIA -Discuss definition of a stroke, what the signs and symptoms are of a stroke, and how to identify when someone is having stroke.   Knowledge Questionnaire Score:   Core Components/Risk Factors/Patient Goals at Admission:   Core Components/Risk Factors/Patient Goals Review:   Goals and Risk Factor Review     Row Name 10/20/23 1600 11/01/23 1511           Core Components/Risk Factors/Patient Goals Review   Personal Goals Review Weight Management/Obesity;Hypertension Weight Management/Obesity      Review Elvera is doing well in rehab. We talked about her BP today. It has been lower in  the past weeks and she has been feeling sluggish as well. She stated that her BP medication was increased by 1/2 and us  wodering if this is causing it to be lower. Informed her to contact her doctor about how she is feeling and giving her a printout of her BP to show the decrease is BP Kateryn is doing well in rehab. She does not ckeck her BP daily. We talked last time about her BP being lower and her feeling sluggish; She did call her cardioloigist at decreased her metoperlol by 1/2 pill and has been feeling better. She is on Zepbound to help with weight loss.      Expected Outcomes Short: contact Card about BP  long: keep checking BP Short: take BP to keep track of numbers    long term: continue to exercise for weight managment and overall well being         Core Components/Risk Factors/Patient Goals at Discharge (Final Review):   Goals and Risk Factor Review - 11/01/23 1511       Core Components/Risk Factors/Patient Goals Review   Personal Goals Review Weight Management/Obesity    Review Seferina is doing well in rehab. She does not ckeck her BP daily. We talked last time about her BP being lower and her feeling sluggish; She did call her cardioloigist at decreased her metoperlol by 1/2 pill and has been feeling better. She is on Zepbound to help with weight loss.    Expected Outcomes Short: take BP to keep track of numbers    long term: continue to exercise for weight managment and overall well being          ITP Comments:  ITP Comments     Row Name 08/04/23 1524 08/09/23 0812 08/23/23 1528 08/31/23 1157 09/28/23 1052   ITP Comments Patient arrived for 1st visit/orientation/education at 1415. Patient was referred to CR by Dr. Mallipeddi due to Stable Angina. During orientation advised patient on arrival and appointment times what to wear, what to do before, during and after exercise. Reviewed attendance and class policy.  Pt is scheduled to return Cardiac Rehab on 08/04/23 at 1500. Pt was  advised to come to class 15 minutes before class starts.  Discussed RPE/Dpysnea scales. Patient participated in warm up stretches. Patient was able to complete 6 minute walk test.  Telemetry:NSR with ST segment depression. Patient was measured for the equipment. Discussed equipment safety with patient. Took patient pre-anthropometric measurements. Patient finished visit at 1300. Patient is on medical hold while medications are being updated. SHe has a follow up on 09/21/2023  and will see if she is cleared after follow up to resume cardiac rehab Continue to wait for clearance for after appt on 09/21/23 30 day review completed. ITP sent to Dr. Dorn Ross, Medical Director of Cardiac Rehab. Continue with ITP unless changes are made by physician.  Medical hold until f/u appt 09/21/23 30 day review completed. ITP sent to Dr. Dorn Ross, Medical Director of Cardiac Rehab. Continue with ITP unless changes are made by physician. Patient has been cleared to start the program but has not started yet. Plans to start 10/04/23.    Row Name 10/04/23 1458 10/26/23 0955 11/23/23 1258 12/21/23 1545 01/18/24 1821   ITP Comments First full day of exercise!  Patient was oriented to gym and equipment including functions, settings, policies, and procedures.  Patient's individual exercise prescription and treatment plan were reviewed.  All starting workloads were established based on the results of the 6 minute walk test done at initial orientation visit.  The plan for exercise progression was also introduced and progression will be customized based on patient's performance and goals. 30 day review completed. ITP sent to Dr. Dorn Ross, Medical Director of Cardiac Rehab. Continue with ITP unless changes are made by physician.  New to program. 30 day review completed. ITP sent to Dr. Dorn Ross, Medical Director of Cardiac Rehab. Continue with ITP unless changes are made by physician. 30 day review completed. ITP sent to  Dr. Dorn Ross, Medical Director of Cardiac Rehab. Continue with ITP unless changes are made by physician.  Currently out on medical hold with foot in boot for broken ankle 30 day review completed. ITP sent to Dr. Dorn Ross, Medical Director of Cardiac Rehab. Continue with ITP unless changes are made by physician.  Currently out on medical hold with foot in boot for broken ankle      Comments: 30 day review

## 2024-01-31 NOTE — Progress Notes (Unsigned)
 [facility] [address], [place] [address]   CLINIC:  Medical Oncology/Hematology  PCP:  Atilano Deward ORN, MD [address] [place] [address] [phone]   REASON FOR VISIT:  Follow-up for stage I LEFT breast cancer  PRIOR THERAPY: - Lumpectomy (08/14/2020) - Adjuvant XRT in [place] from 10/01/2020 through 10/29/2020 - Letrozole  2.5 mg daily started around 11/12/2020.  Self discontinued on 10/01/2021 due to side effects, not interested in alternative agents.  CURRENT THERAPY: Surveillance  BRIEF ONCOLOGIC HISTORY:   Oncology History  Malignant neoplasm of upper-outer quadrant of left breast in female, estrogen receptor positive (HCC)  07/16/2020 Initial Diagnosis   Screening mammogram detected possible asymmetries in the bilateral breasts. 0.5cm mass at the 2:30 position in the left breast, with no left axillary adenopathy, and no focal abnormalities in the right breast. Biopsy showed invasive and in situ ductal carcinoma, grade 1, strong ER/PR positive, HER-2 negative, Ki67 <5%   08/05/2020 Cancer Staging   Staging form: Breast, AJCC 8th Edition - Clinical stage from 08/05/2020: Stage IA (cT1a, cN0, cM0, G1, ER+, PR+, HER2-) - Signed by Odean Potts, MD on 08/05/2020 Stage prefix: Initial diagnosis Histologic grading system: 3 grade system   08/07/2020 Genetic Testing   Negative genetic testing on the Common hereditary cancer panel.  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  The Common Hereditary Gene Panel offered by [facility] includes sequencing and/or deletion duplication testing of the following 47 genes: APC, ATM, AXIN2, BARD1, BMPR1A, BRCA1, BRCA2, BRIP1, CDH1, CDK4, CDKN2A (p14ARF), CDKN2A (p16INK4a), CHEK2, CTNNA1, DICER1, EPCAM (Deletion/duplication testing only), GREM1 (promoter region deletion/duplication testing only), KIT, MEN1, MLH1, MSH2, MSH3, MSH6, MUTYH, NBN, NF1, NHTL1, PALB2, PDGFRA, PMS2, POLD1, POLE, PTEN, RAD50, RAD51C, RAD51D, SDHB,  SDHC, SDHD, SMAD4, SMARCA4. STK11, TP53, TSC1, TSC2, and VHL.  The following genes were evaluated for sequence changes only: SDHA and HOXB13 c.251G>A variant only. The report date is August 07, 2020.   08/14/2020 Surgery   Left lumpectomy Viktoria): invasive and in situ lobular carcinoma, 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    08/14/2020 Cancer Staging   Staging form: Breast, AJCC 8th Edition - Pathologic stage from 08/14/2020: Stage IA (pT1b, pN0, cM0, G1, ER+, PR+, HER2-) - Signed by Crawford Morna Pickle, NP on 03/02/2021 Stage prefix: Initial diagnosis Histologic grading system: 3 grade system   10/01/2020 - 10/29/2020 Radiation Therapy   Dr. Cortland in [place]: Standard radiation therapy to the left breast. This will consist of a dose of 4272 cGy in 16 fractions followed by a boost to the tumor bed of an additional 1000 cGy in 4 fractions. Simulation and treatment treatment are scheduled to begin as soon as possible.    11/2020 -  Anti-estrogen oral therapy   Letrozole  daily     CANCER STAGING: Cancer Staging  Malignant neoplasm of upper-outer quadrant of left breast in female, estrogen receptor positive (HCC) Staging form: Breast, AJCC 8th Edition - Clinical stage from 08/05/2020: Stage IA (cT1a, cN0, cM0, G1, ER+, PR+, HER2-) - Signed by Odean Potts, MD on 08/05/2020 - Pathologic stage from 08/14/2020: Stage IA (pT1b, pN0, cM0, G1, ER+, PR+, HER2-) - Signed by Crawford Morna Pickle, NP on 03/02/2021   INTERVAL HISTORY:   Ms. Holly Allison, a 58 y.o. female, returns for routine follow-up of her stage I left-sided breast cancer. Markesia was last seen on 02/07/2023 by Dr. Rogers.   At today's visit, she  reports feeling ***.   ***  She denies any recent hospitalizations, surgeries, or changes in her  baseline health status. She reports ***% energy and ***% appetite.   ***She is maintaining stable weight at this time. ***She denies any new  breast lumps or axillary lymphadenopathy. ***Mild soreness in the left breast has been present since surgery.  *** ***She denies any new onset pains.  No new abdominal pain or headaches. She takes *** calcium  and *** vitamin D.  ASSESSMENT & PLAN:  1.  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 - Lumpectomy on 08/14/2020: 0.8 cm invasive lobular carcinoma, free margins, 0/4 lymph nodes involved, grade 1, ER 90%, PR 95%, HER2 negative, Ki-67 5%. 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 - Letrozole  2.5 mg daily started around 11/12/2020.  Self discontinued on 10/01/2021 due to arthralgias, severe fatigue, increase in cholesterol and blood sugar.  She was not interested in tamoxifen. - Most recent diagnostic mammogram at [facility] (06/01/2023): BI-RADS Category 2, benign. - Breast exam (02/01/2024): *** - She is not on any antiestrogen therapy as she could not tolerate them, and declined alternative agents. - Labs *** - No red flag symptoms per history/ROS *** - PLAN: *** Annual mammogram due February 2026 (screening or diagnostic). - *** Annual follow-up versus discharge to PCP since she is not on aromatase inhibitor therapy?  ***  2.  Bone health: - DEXA/bone density (06/17/2021): T-score -0.9, normal. - She takes *** calcium  and *** vitamin D. - Labs *** - PLAN: As she is no longer on aromatase inhibitor, we will defer ongoing monitoring and treatment of bone density to her PCP.  *** - Continue to recommend calcium , vitamin D, and weightbearing exercises.  3.  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  She is a non-smoker. - Maternal grandmother had breast cancer.  Maternal great aunt had breast cancer.  PLAN SUMMARY: >> *** >> *** >> ***   REVIEW OF SYSTEMS: ***  Review of Systems -  Oncology  PHYSICAL EXAM:   Performance status (ECOG): {CHL ONC ED:8845999799} *** There were no vitals filed for this visit. Wt Readings from Last 3 Encounters:  09/21/23 189 lb (85.7 kg)  06/30/23 192 lb 12.8 oz (87.5 kg)  05/05/22 193 lb 9.6 oz (87.8 kg)   Physical Exam   PAST MEDICAL/SURGICAL HISTORY:  Past Medical History:  Diagnosis Date   Angio-edema    Cancer (HCC)    Family history of adverse reaction to anesthesia    Mother    Heart murmur    Since childhood   Pre-diabetes    Patient states she was told she is prediabetic at yearly physical two months ago. Patient states her A1 C was around 5.6   Urticaria    Past Surgical History:  Procedure Laterality Date   AXILLARY SENTINEL NODE BIOPSY Left 08/14/2020   Procedure: LEFT AXILLARY SENTINEL NODE BIOPSY;  Surgeon: Ebbie Cough, MD;  Location: [facility] OR;  Service: General;  Laterality: Left;   BREAST LUMPECTOMY WITH RADIOACTIVE SEED AND SENTINEL LYMPH NODE BIOPSY Left 08/14/2020   Procedure: LEFT BREAST LUMPECTOMY WITH RADIOACTIVE SEED;  Surgeon: Ebbie Cough, MD;  Location: [facility] OR;  Service: General;  Laterality: Left;   CESAREAN SECTION     TONSILLECTOMY      SOCIAL HISTORY:  Social History   Socioeconomic History   Marital status: Married  Spouse name: Not on file   Number of children: Not on file   Years of education: Not on file   Highest education level: Not on file  Occupational History   Not on file  Tobacco Use   Smoking status: Never   Smokeless tobacco: Never  Vaping Use   Vaping status: Never Used  Substance and Sexual Activity   Alcohol use: No   Drug use: No   Sexual activity: Not on file  Other Topics Concern   Not on file  Social History Narrative   Not on file   Social Drivers of Health   Financial Resource Strain: Not on file  Food Insecurity: Not on file  Transportation Needs: Not on file  Physical Activity: Not on file  Stress: Not on file  Social Connections: Not on  file  Intimate Partner Violence: Not on file    FAMILY HISTORY:  Family History  Problem Relation Age of Onset   Urticaria Neg Hx    Allergic rhinitis Neg Hx    Angioedema Neg Hx    Asthma Neg Hx    Atopy Neg Hx    Eczema Neg Hx    Immunodeficiency Neg Hx     CURRENT MEDICATIONS:  Current Outpatient Medications  Medication Sig Dispense Refill   aspirin EC 81 MG tablet Take 81 mg by mouth daily. Swallow whole.     calcium  carbonate (OSCAL) 1500 (600 Ca) MG TABS tablet Take 600 mg of elemental calcium  by mouth 2 (two) times daily with a meal.     cetirizine (ZYRTEC) 10 MG tablet Take 10 mg by mouth daily.     cholecalciferol (VITAMIN D3) 25 MCG (1000 UNIT) tablet Take 1,000 Units by mouth 2 (two) times daily.     Evolocumab  (REPATHA  SURECLICK) 140 MG/ML SOAJ INJECT 140MG  INTO THE SKIN EVERY 14 DAYS 6 mL 1   famotidine  (PEPCID ) 40 MG tablet Take 40 mg by mouth daily.     letrozole  (FEMARA ) 2.5 MG tablet Take 1 tablet (2.5 mg total) by mouth daily. 90 tablet 3   levothyroxine (SYNTHROID) 50 MCG tablet Take 50 mcg by mouth daily.     metoprolol  succinate (TOPROL  XL) 25 MG 24 hr tablet Take 0.5 tablets (12.5 mg total) by mouth daily. 45 tablet 1   ranolazine  (RANEXA ) 500 MG 12 hr tablet Take 1 tablet (500 mg total) by mouth 2 (two) times daily. 60 tablet 5   triamcinolone  cream (KENALOG) 0.1 %      ZEPBOUND 2.5 MG/0.5ML Pen Inject 2.5 mg into the skin once a week.     No current facility-administered medications for this visit.    ALLERGIES:  No Known Allergies  LABORATORY DATA:  I have reviewed the labs as listed.     Latest Ref Rng & Units 06/09/2021    2:17 PM 08/06/2020    3:15 PM 07/29/2015    1:48 PM  CBC  WBC 4.0 - 10.5 K/uL 5.5  7.7  6.1   Hemoglobin 12.0 - 15.0 g/dL 88.4  87.1  87.0   Hematocrit 36.0 - 46.0 % 34.5  37.7  38.5   Platelets 150 - 400 K/uL 286  301  316       Latest Ref Rng & Units 06/09/2021    2:17 PM 08/06/2020    3:15 PM 07/29/2015    1:48 PM  CMP   Glucose 70 - 99 mg/dL 99  95  871   BUN 6 - 20 mg/dL 13  10  10   Creatinine 0.44 - 1.00 mg/dL 9.17  9.20  9.16   Sodium 135 - 145 mmol/L 139  138  143   Potassium 3.5 - 5.1 mmol/L 3.9  3.8  3.9   Chloride 98 - 111 mmol/L 103  102  104   CO2 22 - 32 mmol/L 27  28  30    Calcium  8.9 - 10.3 mg/dL 9.7  9.3  9.4   Total Protein 6.5 - 8.1 g/dL 7.8   6.9   Total Bilirubin 0.3 - 1.2 mg/dL 0.3   0.5   Alkaline Phos 38 - 126 U/L 41   47   AST 15 - 41 U/L 21   15   ALT 0 - 44 U/L 23   14     DIAGNOSTIC IMAGING:  I have independently reviewed the scans and discussed with the patient. CT ANKLE LEFT WO CONTRAST Result Date: 01/02/2024 CLINICAL DATA:  [place]lem[place] 11/18/2023.  Followup abnormal x-rays. EXAM: CT OF THE LEFT ANKLE WITHOUT CONTRAST TECHNIQUE: Multidetector CT imaging of the left ankle was performed according to the standard protocol. Multiplanar CT image reconstructions were also generated. RADIATION DOSE REDUCTION: This exam was performed according to the departmental dose-optimization program which includes automated exposure control, adjustment of the mA and/or kV according to patient size and/or use of iterative reconstruction technique. COMPARISON:  Radiographs from August 2025 FINDINGS: 3 or 4 sliver like bony densities along the distal lateral aspect of the calcaneus consistent with small avulsion fractures. One of these is quite dense and may be remote. The others are likely acute. There is also a tiny sliver of bony density along the inferior and medial aspect of the talus which is also likely a small avulsion fracture. No other significant bony findings. Benign bone island noted in the dome region of the talus. The tibiotalar and subtalar joints are maintained. The visualized midfoot joint spaces are also maintained. The sinus tarsi is unremarkable. There is a moderate sized type 2 os navicular. Grossly by CT the medial and lateral ankle ligaments are intact. The medial and lateral ankle tendons  are intact. The Achilles tendon is normal. The plantar fascia appears normal. IMPRESSION: 1. 3 or 4 sliver like bony densities along the distal lateral aspect of the calcaneus consistent with small avulsion fractures. One of these is quite dense and may be remote. The others are likely acute. 2. Tiny sliver bony density along the inferior and medial aspect of the talus which is also likely a small avulsion fracture. 3. No other significant bony findings. 4. Grossly by CT the medial and lateral ankle ligaments and tendons are intact. Electronically Signed   By: MYRTIS Stammer M.D.   On: 01/02/2024 21:27     WRAP UP:  All questions were answered. The patient knows to call the clinic with any problems, questions or concerns.  Medical decision making: ***  Time spent on visit: I spent {CHL ONC TIME VISIT - DTPQU:8845999869} counseling the patient face to face. The total time spent in the appointment was {CHL ONC TIME VISIT - DTPQU:8845999869} and more than 50% was on counseling.  Pleasant CHRISTELLA Barefoot, PA-C  ***

## 2024-02-01 ENCOUNTER — Ambulatory Visit (HOSPITAL_COMMUNITY)
Admission: RE | Admit: 2024-02-01 | Discharge: 2024-02-01 | Disposition: A | Discharge status: Home / Self Care | Source: Ambulatory Visit | Attending: Physician Assistant | Admitting: Physician Assistant

## 2024-02-01 ENCOUNTER — Encounter: Payer: Self-pay | Admitting: Physician Assistant

## 2024-02-01 ENCOUNTER — Inpatient Hospital Stay: Attending: Oncology | Admitting: Physician Assistant

## 2024-02-01 VITALS — BP 144/79 | HR 62 | Temp 98.4°F | Resp 17 | Ht 64.0 in | Wt 165.0 lb

## 2024-02-01 DIAGNOSIS — C50412 Malignant neoplasm of upper-outer quadrant of left female breast: Secondary | ICD-10-CM | POA: Insufficient documentation

## 2024-02-01 DIAGNOSIS — R0789 Other chest pain: Secondary | ICD-10-CM

## 2024-02-01 DIAGNOSIS — J40 Bronchitis, not specified as acute or chronic: Secondary | ICD-10-CM | POA: Diagnosis not present

## 2024-02-01 DIAGNOSIS — Z853 Personal history of malignant neoplasm of breast: Secondary | ICD-10-CM | POA: Diagnosis not present

## 2024-02-01 DIAGNOSIS — Z170 Estrogen receptor positive status [ER+]: Secondary | ICD-10-CM

## 2024-02-01 DIAGNOSIS — Z08 Encounter for follow-up examination after completed treatment for malignant neoplasm: Secondary | ICD-10-CM | POA: Insufficient documentation

## 2024-02-01 DIAGNOSIS — R0781 Pleurodynia: Secondary | ICD-10-CM | POA: Diagnosis not present

## 2024-02-01 DIAGNOSIS — Z1231 Encounter for screening mammogram for malignant neoplasm of breast: Secondary | ICD-10-CM | POA: Diagnosis not present

## 2024-02-01 NOTE — Patient Instructions (Addendum)
 [facility] at [facility] **VISIT SUMMARY & IMPORTANT INSTRUCTIONS **   You were seen today by Pleasant Barefoot PA-C for your history of left-sided breast cancer.    HISTORY OF BREAST CANCER Your most recent labs, physical exam, and mammogram (06/01/2023) did not show any evidence of recurrent breast cancer. Your next screening mammogram is due in February 2026. We we will see you for follow-up labs and physical exam in 1 year.  Please make sure that you are primary care provider checks CBC/D, CMP, and vitamin D within the month prior to your follow-up visit.   OTHER CONCERNS We will check x-ray of your left ribs due to left-sided pain with history of breast cancer. Continue calcium , vitamin D, and weightbearing exercises to strengthen your bones.  FOLLOW-UP APPOINTMENT: 1 year  ** Thank you for trusting me with your healthcare!  I strive to provide all of my patients with quality care at each visit.  If you receive a survey for this visit, I would be so grateful to you for taking the time to provide feedback.  Thank you in advance!  ~ Naif Alabi                                        Dr. Mickiel Davonna Pleasant Barefoot, PA-C          Delon Hope, NP   - - - - - - - - - - - - - - - - - -    Thank you for choosing [facility] at [facility] to provide your oncology and hematology care.  To afford each patient quality time with our provider, please arrive at least 15 minutes before your scheduled appointment time.   If you have a lab appointment with the Cancer Center please come in thru the Main Entrance and check in at the main information desk.  You need to re-schedule your appointment should you arrive 10 or more minutes late.  We strive to give you quality time with our providers, and arriving late affects you and other patients whose appointments are after yours.  Also, if you no show three or more times for appointments you may  be dismissed from the clinic at the providers discretion.     Again, thank you for choosing [facility].  Our hope is that these requests will decrease the amount of time that you wait before being seen by our physicians.       _____________________________________________________________  Should you have questions after your visit to [facility], please contact our office at [phone] and follow the prompts.  Our office hours are 8:00 a.m. and 4:30 p.m. Monday - Friday.  Please note that voicemails left after 4:00 p.m. may not be returned until the following business day.  We are closed weekends and major holidays.  You do have access to a nurse 24-7, just call the main number to the clinic [phone] and do not press any options, hold on the line and a nurse will answer the phone.    For prescription refill requests, have your pharmacy contact our office and allow 72 hours.

## 2024-02-06 ENCOUNTER — Ambulatory Visit: Payer: Self-pay | Admitting: Physician Assistant

## 2024-02-06 ENCOUNTER — Ambulatory Visit: Payer: BC Managed Care – PPO | Admitting: Hematology

## 2024-02-10 DIAGNOSIS — Z6827 Body mass index (BMI) 27.0-27.9, adult: Secondary | ICD-10-CM | POA: Diagnosis not present

## 2024-02-10 DIAGNOSIS — S92115D Nondisplaced fracture of neck of left talus, subsequent encounter for fracture with routine healing: Secondary | ICD-10-CM | POA: Diagnosis not present

## 2024-02-15 ENCOUNTER — Encounter (HOSPITAL_COMMUNITY): Payer: Self-pay

## 2024-02-15 DIAGNOSIS — I2089 Other forms of angina pectoris: Secondary | ICD-10-CM

## 2024-02-15 NOTE — Progress Notes (Signed)
 Cardiac Individual Treatment Plan  Patient Details  Name: Holly Allison MRN: [id number] Date of Birth: 01-Jan-1966 Referring Provider:   Flowsheet Row CARDIAC REHAB PHASE II ORIENTATION from 08/03/2023 in [facility] CARDIAC REHABILITATION  Referring Provider Mallipeddi, Vishnu    Initial Encounter Date:  Flowsheet Row CARDIAC REHAB PHASE II ORIENTATION from 08/03/2023 in [facility] CARDIAC REHABILITATION  Date 08/03/23    Visit Diagnosis: Stable angina  Patient's Home Medications on Admission:  Current Outpatient Medications:    aspirin EC 81 MG tablet, Take 81 mg by mouth daily. Swallow whole., Disp: , Rfl:    calcium  carbonate (OSCAL) 1500 (600 Ca) MG TABS tablet, Take 600 mg of elemental calcium  by mouth 2 (two) times daily with a meal., Disp: , Rfl:    cetirizine (ZYRTEC) 10 MG tablet, Take 10 mg by mouth daily., Disp: , Rfl:    cholecalciferol (VITAMIN D3) 25 MCG (1000 UNIT) tablet, Take 1,000 Units by mouth 2 (two) times daily., Disp: , Rfl:    Evolocumab  (REPATHA  SURECLICK) 140 MG/ML SOAJ, INJECT 140MG  INTO THE SKIN EVERY 14 DAYS, Disp: 6 mL, Rfl: 1   famotidine  (PEPCID ) 40 MG tablet, Take 40 mg by mouth daily., Disp: , Rfl:    letrozole  (FEMARA ) 2.5 MG tablet, Take 1 tablet (2.5 mg total) by mouth daily., Disp: 90 tablet, Rfl: 3   levothyroxine (SYNTHROID) 50 MCG tablet, Take 50 mcg by mouth daily., Disp: , Rfl:    metoprolol  succinate (TOPROL  XL) 25 MG 24 hr tablet, Take 0.5 tablets (12.5 mg total) by mouth daily., Disp: 45 tablet, Rfl: 1   ranolazine  (RANEXA ) 500 MG 12 hr tablet, Take 1 tablet (500 mg total) by mouth 2 (two) times daily., Disp: 60 tablet, Rfl: 5   triamcinolone  cream (KENALOG) 0.1 %, , Disp: , Rfl:    ZEPBOUND 2.5 MG/0.5ML Pen, Inject 2.5 mg into the skin once a week., Disp: , Rfl:   Past Medical History: Past Medical History:  Diagnosis Date   Angio-edema    Cancer (HCC)    Family history of adverse reaction to anesthesia    Mother    Heart murmur     Since childhood   Pre-diabetes    Patient states she was told she is prediabetic at yearly physical two months ago. Patient states her A1 C was around 5.6   Urticaria     Tobacco Use: Social History   Tobacco Use  Smoking Status Never  Smokeless Tobacco Never    Labs: Review Flowsheet       Latest Ref Rng & Units 08/06/2020  Labs for ITP Cardiac and Pulmonary Rehab  Hemoglobin A1c 4.8 - 5.6 % 5.5      Exercise Target Goals: Exercise Program Goal: Individual exercise prescription set using results from initial 6 min walk test and THRR while considering  patient's activity barriers and safety.   Exercise Prescription Goal: Initial exercise prescription builds to 30-45 minutes a day of aerobic activity, 2-3 days per week.  Home exercise guidelines will be given to patient during program as part of exercise prescription that the participant will acknowledge.   Education: Aerobic Exercise: - Group verbal and visual presentation on the components of exercise prescription. Introduces F.I.T.T principle from ACSM for exercise prescriptions.  Reviews F.I.T.T. principles of aerobic exercise including progression. Written material provided at class time.   Education: Resistance Exercise: - Group verbal and visual presentation on the components of exercise prescription. Introduces F.I.T.T principle from ACSM for exercise prescriptions  Reviews  F.I.T.T. principles of resistance exercise including progression. Written material provided at class time.    Education: Exercise & Equipment Safety: - Individual verbal instruction and demonstration of equipment use and safety with use of the equipment.   Education: Exercise Physiology & General Exercise Guidelines: - Group verbal and written instruction with models to review the exercise physiology of the cardiovascular system and associated critical values. Provides general exercise guidelines with specific guidelines to those with heart or  lung disease. Written material provided at class time.   Education: Flexibility, Balance, Mind/Body Relaxation: - Group verbal and visual presentation with interactive activity on the components of exercise prescription. Introduces F.I.T.T principle from ACSM for exercise prescriptions. Reviews F.I.T.T. principles of flexibility and balance exercise training including progression. Also discusses the mind body connection.  Reviews various relaxation techniques to help reduce and manage stress (i.e. Deep breathing, progressive muscle relaxation, and visualization). Balance handout provided to take home. Written material provided at class time.   Activity Barriers & Risk Stratification:   6 Minute Walk:   Oxygen Initial Assessment:   Oxygen Re-Evaluation:   Oxygen Discharge (Final Oxygen Re-Evaluation):   Initial Exercise Prescription:   Perform Capillary Blood Glucose checks as needed.  Exercise Prescription Changes:   Exercise Prescription Changes     Row Name 10/25/23 1500 11/08/23 1500 11/10/23 1500         Response to Exercise   Blood Pressure (Admit) 118/70 -- 110/60     Blood Pressure (Exit) 120/70 -- 98/60     Heart Rate (Admit) 55 bpm -- 60 bpm     Heart Rate (Exercise) 93 bpm -- 104 bpm     Heart Rate (Exit) 70 bpm -- 53 bpm     Rating of Perceived Exertion (Exercise) 12 -- 12     Duration Continue with 30 min of aerobic exercise without signs/symptoms of physical distress. -- Continue with 30 min of aerobic exercise without signs/symptoms of physical distress.     Intensity THRR unchanged -- THRR unchanged       Progression   Progression Continue to progress workloads to maintain intensity without signs/symptoms of physical distress. -- Continue to progress workloads to maintain intensity without signs/symptoms of physical distress.       Resistance Training   Training Prescription Yes -- Yes     Weight 4 -- 4     Reps 10-15 -- 10-15       Treadmill   MPH  2.5 -- 2.8     Grade 0 -- 1.5     Minutes 15 -- 15     METs 2.91 -- 3.72       REL-XR   Level 2 -- 3     Speed 50 -- 63     Minutes 15 -- 15     METs 3.6 -- 3.8       Home Exercise Plan   Plans to continue exercise at -- Home (comment)  walking, gym equipment Home (comment)     Frequency -- Add 3 additional days to program exercise sessions. Add 3 additional days to program exercise sessions.     Initial Home Exercises Provided -- 11/08/23 --        Exercise Comments:   Exercise Comments     Row Name 10/04/23 1458           Exercise Comments First full day of exercise!  Patient was oriented to gym and equipment including functions, settings, policies, and procedures.  Patient's individual  exercise prescription and treatment plan were reviewed.  All starting workloads were established based on the results of the 6 minute walk test done at initial orientation visit.  The plan for exercise progression was also introduced and progression will be customized based on patient's performance and goals.          Exercise Goals and Review:   Exercise Goals Re-Evaluation :  Exercise Goals Re-Evaluation     Row Name 10/04/23 1509 10/20/23 1552 11/01/23 1457 11/08/23 1515       Exercise Goal Re-Evaluation   Exercise Goals Review Knowledge and understanding of Target Heart Rate Range (THRR);Able to understand and use rate of perceived exertion (RPE) scale Increase Strength and Stamina;Increase Physical Activity;Understanding of Exercise Prescription Increase Physical Activity;Increase Strength and Stamina;Understanding of Exercise Prescription Increase Physical Activity;Increase Strength and Stamina;Able to check pulse independently;Understanding of Exercise Prescription    Comments Reviewed RPE and dyspnea scale, THR and program prescription with pt today.  Pt voiced understanding and was given a copy of goals to take home. Shizue is doing well in rehab. She has noticed since starting  that her endurance and increased some. She did have some burning when she statrted the program between her shoulder blades but it has eased up. She does have some burning if she increases her walking speed near the end of the 15 minutes but it does ease up. Raychel is doing well in rehab. She is on her 15th session and continues to notice increased endurance. She continues to have no symptoms when exericsing like she did at the oritentation walk test. She has not walked much outside of rehab due to the heat. Reviewed home exercise with pt today.  Pt plans to walk and use gym equipment at home for exercise.  Reviewed THR, pulse, RPE, sign and symptoms, pulse oximetery and when to call 911 or MD.  Also discussed weather considerations and indoor options.  Pt voiced understanding.    Expected Outcomes Short: Use RPE daily to regulate intensity.  Long: Follow program prescription in THR. Short: increase some on speed  Long: contine to exericse Short: start exercisng at home  long term : continue to exerice at home Short: Start to add in more exercise at home Long: Conitnue to exercise independently       Discharge Exercise Prescription (Final Exercise Prescription Changes):  Exercise Prescription Changes - 11/10/23 1500       Response to Exercise   Blood Pressure (Admit) 110/60    Blood Pressure (Exit) 98/60    Heart Rate (Admit) 60 bpm    Heart Rate (Exercise) 104 bpm    Heart Rate (Exit) 53 bpm    Rating of Perceived Exertion (Exercise) 12    Duration Continue with 30 min of aerobic exercise without signs/symptoms of physical distress.    Intensity THRR unchanged      Progression   Progression Continue to progress workloads to maintain intensity without signs/symptoms of physical distress.      Resistance Training   Training Prescription Yes    Weight 4    Reps 10-15      Treadmill   MPH 2.8    Grade 1.5    Minutes 15    METs 3.72      REL-XR   Level 3    Speed 63    Minutes 15     METs 3.8      Home Exercise Plan   Plans to continue exercise at Home (comment)  Frequency Add 3 additional days to program exercise sessions.          Nutrition:  Target Goals: Understanding of nutrition guidelines, daily intake of sodium 1500mg , cholesterol 200mg , calories 30% from fat and 7% or less from saturated fats, daily to have 5 or more servings of fruits and vegetables.  Education: Nutrition 1 -Group instruction provided by verbal, written material, interactive activities, discussions, models, and posters to present general guidelines for heart healthy nutrition including macronutrients, label reading, and promoting whole foods over processed counterparts. Education serves as pensions consultant of discussion of heart healthy eating for all. Written material provided at class time.    Education: Nutrition 2 -Group instruction provided by verbal, written material, interactive activities, discussions, models, and posters to present general guidelines for heart healthy nutrition including sodium, cholesterol, and saturated fat. Providing guidance of habit forming to improve blood pressure, cholesterol, and body weight. Written material provided at class time.     Biometrics:    Nutrition Therapy Plan and Nutrition Goals:   Nutrition Assessments:  MEDIFICTS Score Key: >=70 Need to make dietary changes  40-70 Heart Healthy Diet <= 40 Therapeutic Level Cholesterol Diet  Flowsheet Row CARDIAC VIRTUAL BASED CARE from 07/06/2023 in [facility] CARDIAC REHABILITATION  Picture Your Plate Total Score on Admission 50   Picture Your Plate Scores: <59 Unhealthy dietary pattern with much room for improvement. 41-50 Dietary pattern unlikely to meet recommendations for good health and room for improvement. 51-60 More healthful dietary pattern, with some room for improvement.  >60 Healthy dietary pattern, although there may be some specific behaviors that could be improved.     Nutrition Goals Re-Evaluation:  Nutrition Goals Re-Evaluation     Row Name 10/20/23 1557 11/01/23 1510           Goals   Nutrition Goal healthy eating --      Comment Fareedah is doing well in rehab. She has just started Zepbound to help with weight. She stated that it has decreased her appitie and she does not want to eat very much. She is working with this and getting use to being on the shot. Levaeh is doing well in rehab. She is still on Zepbound to help with weight. She stated that she has started to get her appitie back and eating more then when she first started the zepbound.      Expected Outcome Short: find smaller portions of healthy foods you like to keep up with proteins   long term: focus on eating healthy options Short: pick healthier meals with small portions to help with weight lost   long: continue with healthy eating         Nutrition Goals Discharge (Final Nutrition Goals Re-Evaluation):  Nutrition Goals Re-Evaluation - 11/01/23 1510       Goals   Comment Chayanne is doing well in rehab. She is still on Zepbound to help with weight. She stated that she has started to get her appitie back and eating more then when she first started the zepbound.    Expected Outcome Short: pick healthier meals with small portions to help with weight lost   long: continue with healthy eating          Psychosocial: Target Goals: Acknowledge presence or absence of significant depression and/or stress, maximize coping skills, provide positive support system. Participant is able to verbalize types and ability to use techniques and skills needed for reducing stress and depression.   Education: Stress, Anxiety, and Depression -  Group verbal and visual presentation to define topics covered.  Reviews how body is impacted by stress, anxiety, and depression.  Also discusses healthy ways to reduce stress and to treat/manage anxiety and depression. Written material provided at class  time.   Education: Sleep Hygiene -Provides group verbal and written instruction about how sleep can affect your health.  Define sleep hygiene, discuss sleep cycles and impact of sleep habits. Review good sleep hygiene tips.   Initial Review & Psychosocial Screening:   Quality of Life Scores:   Scores of 19 and below usually indicate a poorer quality of life in these areas.  A difference of  2-3 points is a clinically meaningful difference.  A difference of 2-3 points in the total score of the Quality of Life Index has been associated with significant improvement in overall quality of life, self-image, physical symptoms, and general health in studies assessing change in quality of life.  PHQ-9: Review Flowsheet       02/01/2024 08/03/2023  Depression screen PHQ 2/9  Decreased Interest 0 0  Down, Depressed, Hopeless 0 0  PHQ - 2 Score 0 0  Altered sleeping - 0  Tired, decreased energy - 3  Change in appetite - 0  Feeling bad or failure about yourself  - 0  Trouble concentrating - 1  Moving slowly or fidgety/restless - 0  Suicidal thoughts - 0  PHQ-9 Score - 4   Interpretation of Total Score  Total Score Depression Severity:  1-4 = Minimal depression, 5-9 = Mild depression, 10-14 = Moderate depression, 15-19 = Moderately severe depression, 20-27 = Severe depression   Psychosocial Evaluation and Intervention:   Psychosocial Re-Evaluation:  Psychosocial Re-Evaluation     Row Name 10/20/23 1555 11/01/23 1504           Psychosocial Re-Evaluation   Current issues with Current Stress Concerns;Current Sleep Concerns Current Stress Concerns;Current Sleep Concerns      Comments Yue is doing well in rehab. She has had stress and sleep issues due to having two childeren getting married this year and all the planning. She has one wedidng coming up in Auguest so she is stressing making sure everything is in order and her mind races at night some thinking about things that need to  be done for the wedding. Ruhama continues to do well in rehab. She had one of her daughters bridal showers this past weekend so she has been worn from that. She is still stressed with both daughters getting married this year. She  still says that her mind races at night thinking about things that need to be done so she does not sleep well.      Expected Outcomes Short; Find an outlet for stress when it comes to wedding planning, have a list do small list at a time to help decrease stress   Long: exercise to help with stress releif Short; Find an outlet for stress when it comes to wedding planning, have a list do small list at a time to help decrease stress   Long: exercise to help with stress releif      Interventions Encouraged to attend Cardiac Rehabilitation for the exercise Encouraged to attend Cardiac Rehabilitation for the exercise      Continue Psychosocial Services  Follow up required by staff Follow up required by staff         Psychosocial Discharge (Final Psychosocial Re-Evaluation):  Psychosocial Re-Evaluation - 11/01/23 1504       Psychosocial Re-Evaluation  Current issues with Current Stress Concerns;Current Sleep Concerns    Comments Zlata continues to do well in rehab. She had one of her daughters bridal showers this past weekend so she has been worn from that. She is still stressed with both daughters getting married this year. She  still says that her mind races at night thinking about things that need to be done so she does not sleep well.    Expected Outcomes Short; Find an outlet for stress when it comes to wedding planning, have a list do small list at a time to help decrease stress   Long: exercise to help with stress releif    Interventions Encouraged to attend Cardiac Rehabilitation for the exercise    Continue Psychosocial Services  Follow up required by staff          Vocational Rehabilitation: Provide vocational rehab assistance to qualifying candidates.    Vocational Rehab Evaluation & Intervention:   Education: Education Goals: Education classes will be provided on a variety of topics geared toward better understanding of heart health and risk factor modification. Participant will state understanding/return demonstration of topics presented as noted by education test scores.  Learning Barriers/Preferences:   General Cardiac Education Topics:  AED/CPR: - Group verbal and written instruction with the use of models to demonstrate the basic use of the AED with the basic ABC's of resuscitation.   Test and Procedures: - Group verbal and visual presentation and models provide information about basic cardiac anatomy and function. Reviews the testing methods done to diagnose heart disease and the outcomes of the test results. Describes the treatment choices: Medical Management, Angioplasty, or Coronary Bypass Surgery for treating various heart conditions including Myocardial Infarction, Angina, Valve Disease, and Cardiac Arrhythmias. Written material provided at class time.   Medication Safety: - Group verbal and visual instruction to review commonly prescribed medications for heart and lung disease. Reviews the medication, class of the drug, and side effects. Includes the steps to properly store meds and maintain the prescription regimen. Written material provided at class time.   Intimacy: - Group verbal instruction through game format to discuss how heart and lung disease can affect sexual intimacy. Written material provided at class time.   Know Your Numbers and Heart Failure: - Group verbal and visual instruction to discuss disease risk factors for cardiac and pulmonary disease and treatment options.  Reviews associated critical values for Overweight/Obesity, Hypertension, Cholesterol, and Diabetes.  Discusses basics of heart failure: signs/symptoms and treatments.  Introduces Heart Failure Zone chart for action plan for heart failure. Written  material provided at class time.   Infection Prevention: - Provides verbal and written material to individual with discussion of infection control including proper hand washing and proper equipment cleaning during exercise session.   Falls Prevention: - Provides verbal and written material to individual with discussion of falls prevention and safety.   Other: -Provides group and verbal instruction on various topics (see comments)   Knowledge Questionnaire Score:   Core Components/Risk Factors/Patient Goals at Admission:   Education:Diabetes - Individual verbal and written instruction to review signs/symptoms of diabetes, desired ranges of glucose level fasting, after meals and with exercise. Acknowledge that pre and post exercise glucose checks will be done for 3 sessions at entry of program.   Core Components/Risk Factors/Patient Goals Review:   Goals and Risk Factor Review     Row Name 10/20/23 1600 11/01/23 1511           Core Components/Risk Factors/Patient Goals  Review   Personal Goals Review Weight Management/Obesity;Hypertension Weight Management/Obesity      Review Damarys is doing well in rehab. We talked about her BP today. It has been lower in the past weeks and she has been feeling sluggish as well. She stated that her BP medication was increased by 1/2 and us  wodering if this is causing it to be lower. Informed her to contact her doctor about how she is feeling and giving her a printout of her BP to show the decrease is BP Maclaine is doing well in rehab. She does not ckeck her BP daily. We talked last time about her BP being lower and her feeling sluggish; She did call her cardioloigist at decreased her metoperlol by 1/2 pill and has been feeling better. She is on Zepbound to help with weight loss.      Expected Outcomes Short: contact Card about BP  long: keep checking BP Short: take BP to keep track of numbers    long term: continue to exercise for weight managment  and overall well being         Core Components/Risk Factors/Patient Goals at Discharge (Final Review):   Goals and Risk Factor Review - 11/01/23 1511       Core Components/Risk Factors/Patient Goals Review   Personal Goals Review Weight Management/Obesity    Review Armetta is doing well in rehab. She does not ckeck her BP daily. We talked last time about her BP being lower and her feeling sluggish; She did call her cardioloigist at decreased her metoperlol by 1/2 pill and has been feeling better. She is on Zepbound to help with weight loss.    Expected Outcomes Short: take BP to keep track of numbers    long term: continue to exercise for weight managment and overall well being          ITP Comments:  ITP Comments     Row Name 08/23/23 1528 08/31/23 1157 09/28/23 1052 10/04/23 1458 10/26/23 0955   ITP Comments Continue to wait for clearance for after appt on 09/21/23 30 day review completed. ITP sent to Dr. Dorn Ross, Medical Director of Cardiac Rehab. Continue with ITP unless changes are made by physician.  Medical hold until f/u appt 09/21/23 30 day review completed. ITP sent to Dr. Dorn Ross, Medical Director of Cardiac Rehab. Continue with ITP unless changes are made by physician. Patient has been cleared to start the program but has not started yet. Plans to start 10/04/23. First full day of exercise!  Patient was oriented to gym and equipment including functions, settings, policies, and procedures.  Patient's individual exercise prescription and treatment plan were reviewed.  All starting workloads were established based on the results of the 6 minute walk test done at initial orientation visit.  The plan for exercise progression was also introduced and progression will be customized based on patient's performance and goals. 30 day review completed. ITP sent to Dr. Dorn Ross, Medical Director of Cardiac Rehab. Continue with ITP unless changes are made by physician.  New to  program.    Row Name 11/23/23 1258 12/21/23 1545 01/18/24 1821 02/15/24 0930     ITP Comments 30 day review completed. ITP sent to Dr. Dorn Ross, Medical Director of Cardiac Rehab. Continue with ITP unless changes are made by physician. 30 day review completed. ITP sent to Dr. Dorn Ross, Medical Director of Cardiac Rehab. Continue with ITP unless changes are made by physician.  Currently out on medical hold with  foot in boot for broken ankle 30 day review completed. ITP sent to Dr. Dorn Ross, Medical Director of Cardiac Rehab. Continue with ITP unless changes are made by physician.  Currently out on medical hold with foot in boot for broken ankle 30 day review completed. ITP sent to Dr. Dorn Ross, Medical Director of Cardiac Rehab. Continue with ITP unless changes are made by physician.  Currently out on medical hold with foot in boot for broken ankle       Comments: 30 day review

## 2024-03-03 ENCOUNTER — Other Ambulatory Visit: Payer: Self-pay | Admitting: Internal Medicine

## 2024-03-03 DIAGNOSIS — E785 Hyperlipidemia, unspecified: Secondary | ICD-10-CM

## 2024-03-03 DIAGNOSIS — I25118 Atherosclerotic heart disease of native coronary artery with other forms of angina pectoris: Secondary | ICD-10-CM

## 2024-03-06 ENCOUNTER — Telehealth: Payer: Self-pay | Admitting: Pharmacy Technician

## 2024-03-06 NOTE — Telephone Encounter (Signed)
   Pharmacy Patient Advocate Encounter   Received notification from CoverMyMeds that prior authorization for repatha  is required/requested.   Insurance verification completed.   The patient is insured through [facility].   Per test claim: PA required; PA submitted to above mentioned insurance via Latent Key/confirmation #/EOC [id number] Status is pending

## 2024-03-09 NOTE — Telephone Encounter (Signed)
 Pharmacy Patient Advocate Encounter  Received notification from [facility] that Prior Authorization for REPATHA  has been APPROVED from 03/09/24 to 03/06/27   PA #/Case ID/Reference #: [id number]

## 2024-03-13 ENCOUNTER — Encounter (HOSPITAL_COMMUNITY): Payer: Self-pay | Admitting: *Deleted

## 2024-03-13 DIAGNOSIS — I2089 Other forms of angina pectoris: Secondary | ICD-10-CM

## 2024-03-13 NOTE — Progress Notes (Signed)
 Cardiac Individual Treatment Plan  Patient Details  Name: Holly Allison MRN: [id number] Date of Birth: [birth year] Referring Provider:   Flowsheet Row CARDIAC REHAB PHASE II ORIENTATION from 08/03/2023 in [facility] CARDIAC REHABILITATION  Referring Provider Mallipeddi, Vishnu    Initial Encounter Date:  Flowsheet Row CARDIAC REHAB PHASE II ORIENTATION from 08/03/2023 in [place] CARDIAC REHABILITATION  Date 08/03/23    Visit Diagnosis: Stable angina  Patient's Home Medications on Admission:  Current Outpatient Medications:    aspirin EC 81 MG tablet, Take 81 mg by mouth daily. Swallow whole., Disp: , Rfl:    calcium  carbonate (OSCAL) 1500 (600 Ca) MG TABS tablet, Take 600 mg of elemental calcium  by mouth 2 (two) times daily with a meal., Disp: , Rfl:    cetirizine (ZYRTEC) 10 MG tablet, Take 10 mg by mouth daily., Disp: , Rfl:    cholecalciferol (VITAMIN D3) 25 MCG (1000 UNIT) tablet, Take 1,000 Units by mouth 2 (two) times daily., Disp: , Rfl:    Evolocumab  (REPATHA  SURECLICK) 140 MG/ML SOAJ, INJECT 140MG  INTO THE SKIN EVERY 14 DAYS, Disp: 6 mL, Rfl: 1   famotidine  (PEPCID ) 40 MG tablet, Take 40 mg by mouth daily., Disp: , Rfl:    letrozole  (FEMARA ) 2.5 MG tablet, Take 1 tablet (2.5 mg total) by mouth daily., Disp: 90 tablet, Rfl: 3   levothyroxine (SYNTHROID) 50 MCG tablet, Take 50 mcg by mouth daily., Disp: , Rfl:    metoprolol  succinate (TOPROL  XL) 25 MG 24 hr tablet, Take 0.5 tablets (12.5 mg total) by mouth daily., Disp: 45 tablet, Rfl: 1   ranolazine  (RANEXA ) 500 MG 12 hr tablet, Take 1 tablet (500 mg total) by mouth 2 (two) times daily., Disp: 60 tablet, Rfl: 5   triamcinolone  cream (KENALOG) 0.1 %, , Disp: , Rfl:    ZEPBOUND 2.5 MG/0.5ML Pen, Inject 2.5 mg into the skin once a week., Disp: , Rfl:   Past Medical History: Past Medical History:  Diagnosis Date   Angio-edema    Cancer (HCC)    Family history of adverse reaction to anesthesia    Mother    Heart murmur     Since childhood   Pre-diabetes    Patient states she was told she is prediabetic at yearly physical two months ago. Patient states her A1 C was around 5.6   Urticaria     Tobacco Use: Social History   Tobacco Use  Smoking Status Never  Smokeless Tobacco Never    Labs: Review Flowsheet       Latest Ref Rng & Units 08/06/2020  Labs for ITP Cardiac and Pulmonary Rehab  Hemoglobin A1c 4.8 - 5.6 % 5.5     Capillary Blood Glucose: Lab Results  Component Value Date   GLUCAP 124 (H) 08/30/2014     Exercise Target Goals: Exercise Program Goal: Individual exercise prescription set using results from initial 6 min walk test and THRR while considering  patient's activity barriers and safety.   Exercise Prescription Goal: Starting with aerobic activity 30 plus minutes a day, 3 days per week for initial exercise prescription. Provide home exercise prescription and guidelines that participant acknowledges understanding prior to discharge.  Activity Barriers & Risk Stratification:   6 Minute Walk:   Oxygen Initial Assessment:   Oxygen Re-Evaluation:   Oxygen Discharge (Final Oxygen Re-Evaluation):   Initial Exercise Prescription:   Perform Capillary Blood Glucose checks as needed.  Exercise Prescription Changes:   Exercise Prescription Changes     Row Name  10/25/23 1500 11/08/23 1500 11/10/23 1500         Response to Exercise   Blood Pressure (Admit) 118/70 -- 110/60     Blood Pressure (Exit) 120/70 -- 98/60     Heart Rate (Admit) 55 bpm -- 60 bpm     Heart Rate (Exercise) 93 bpm -- 104 bpm     Heart Rate (Exit) 70 bpm -- 53 bpm     Rating of Perceived Exertion (Exercise) 12 -- 12     Duration Continue with 30 min of aerobic exercise without signs/symptoms of physical distress. -- Continue with 30 min of aerobic exercise without signs/symptoms of physical distress.     Intensity THRR unchanged -- THRR unchanged       Progression   Progression Continue to  progress workloads to maintain intensity without signs/symptoms of physical distress. -- Continue to progress workloads to maintain intensity without signs/symptoms of physical distress.       Resistance Training   Training Prescription Yes -- Yes     Weight 4 -- 4     Reps 10-15 -- 10-15       Treadmill   MPH 2.5 -- 2.8     Grade 0 -- 1.5     Minutes 15 -- 15     METs 2.91 -- 3.72       REL-XR   Level 2 -- 3     Speed 50 -- 63     Minutes 15 -- 15     METs 3.6 -- 3.8       Home Exercise Plan   Plans to continue exercise at -- Home (comment)  walking, gym equipment Home (comment)     Frequency -- Add 3 additional days to program exercise sessions. Add 3 additional days to program exercise sessions.     Initial Home Exercises Provided -- 11/08/23 --        Exercise Comments:   Exercise Comments     Row Name 10/04/23 1458           Exercise Comments First full day of exercise!  Patient was oriented to gym and equipment including functions, settings, policies, and procedures.  Patient's individual exercise prescription and treatment plan were reviewed.  All starting workloads were established based on the results of the 6 minute walk test done at initial orientation visit.  The plan for exercise progression was also introduced and progression will be customized based on patient's performance and goals.          Exercise Goals and Review:   Exercise Goals Re-Evaluation :  Exercise Goals Re-Evaluation     Row Name 10/04/23 1509 10/20/23 1552 11/01/23 1457 11/08/23 1515       Exercise Goal Re-Evaluation   Exercise Goals Review Knowledge and understanding of Target Heart Rate Range (THRR);Able to understand and use rate of perceived exertion (RPE) scale Increase Strength and Stamina;Increase Physical Activity;Understanding of Exercise Prescription Increase Physical Activity;Increase Strength and Stamina;Understanding of Exercise Prescription Increase Physical  Activity;Increase Strength and Stamina;Able to check pulse independently;Understanding of Exercise Prescription    Comments Reviewed RPE and dyspnea scale, THR and program prescription with pt today.  Pt voiced understanding and was given a copy of goals to take home. Chanie is doing well in rehab. She has noticed since starting that her endurance and increased some. She did have some burning when she statrted the program between her shoulder blades but it has eased up. She does have some burning if she  increases her walking speed near the end of the 15 minutes but it does ease up. Maryuri is doing well in rehab. She is on her 15th session and continues to notice increased endurance. She continues to have no symptoms when exericsing like she did at the oritentation walk test. She has not walked much outside of rehab due to the heat. Reviewed home exercise with pt today.  Pt plans to walk and use gym equipment at home for exercise.  Reviewed THR, pulse, RPE, sign and symptoms, pulse oximetery and when to call 911 or MD.  Also discussed weather considerations and indoor options.  Pt voiced understanding.    Expected Outcomes Short: Use RPE daily to regulate intensity.  Long: Follow program prescription in THR. Short: increase some on speed  Long: contine to exericse Short: start exercisng at home  long term : continue to exerice at home Short: Start to add in more exercise at home Long: Conitnue to exercise independently        Discharge Exercise Prescription (Final Exercise Prescription Changes):  Exercise Prescription Changes - 11/10/23 1500       Response to Exercise   Blood Pressure (Admit) 110/60    Blood Pressure (Exit) 98/60    Heart Rate (Admit) 60 bpm    Heart Rate (Exercise) 104 bpm    Heart Rate (Exit) 53 bpm    Rating of Perceived Exertion (Exercise) 12    Duration Continue with 30 min of aerobic exercise without signs/symptoms of physical distress.    Intensity THRR unchanged       Progression   Progression Continue to progress workloads to maintain intensity without signs/symptoms of physical distress.      Resistance Training   Training Prescription Yes    Weight 4    Reps 10-15      Treadmill   MPH 2.8    Grade 1.5    Minutes 15    METs 3.72      REL-XR   Level 3    Speed 63    Minutes 15    METs 3.8      Home Exercise Plan   Plans to continue exercise at Home (comment)    Frequency Add 3 additional days to program exercise sessions.          Nutrition:  Target Goals: Understanding of nutrition guidelines, daily intake of sodium 1500mg , cholesterol 200mg , calories 30% from fat and 7% or less from saturated fats, daily to have 5 or more servings of fruits and vegetables.  Biometrics:    Nutrition Therapy Plan and Nutrition Goals:   Nutrition Assessments:  MEDIFICTS Score Key: >=70 Need to make dietary changes  40-70 Heart Healthy Diet <= 40 Therapeutic Level Cholesterol Diet  Flowsheet Row CARDIAC VIRTUAL BASED CARE from 07/06/2023 in [facility] CARDIAC REHABILITATION  Picture Your Plate Total Score on Admission 50   Picture Your Plate Scores: <59 Unhealthy dietary pattern with much room for improvement. 41-50 Dietary pattern unlikely to meet recommendations for good health and room for improvement. 51-60 More healthful dietary pattern, with some room for improvement.  >60 Healthy dietary pattern, although there may be some specific behaviors that could be improved.    Nutrition Goals Re-Evaluation:  Nutrition Goals Re-Evaluation     Row Name 10/20/23 1557 11/01/23 1510           Goals   Nutrition Goal healthy eating --      Comment Tyja is doing well in rehab. She has  just started Zepbound to help with weight. She stated that it has decreased her appitie and she does not want to eat very much. She is working with this and getting use to being on the shot. Shonna is doing well in rehab. She is still on Zepbound to help  with weight. She stated that she has started to get her appitie back and eating more then when she first started the zepbound.      Expected Outcome Short: find smaller portions of healthy foods you like to keep up with proteins   long term: focus on eating healthy options Short: pick healthier meals with small portions to help with weight lost   long: continue with healthy eating         Nutrition Goals Discharge (Final Nutrition Goals Re-Evaluation):  Nutrition Goals Re-Evaluation - 11/01/23 1510       Goals   Comment Wanette is doing well in rehab. She is still on Zepbound to help with weight. She stated that she has started to get her appitie back and eating more then when she first started the zepbound.    Expected Outcome Short: pick healthier meals with small portions to help with weight lost   long: continue with healthy eating          Psychosocial: Target Goals: Acknowledge presence or absence of significant depression and/or stress, maximize coping skills, provide positive support system. Participant is able to verbalize types and ability to use techniques and skills needed for reducing stress and depression.  Initial Review & Psychosocial Screening:   Quality of Life Scores:  Scores of 19 and below usually indicate a poorer quality of life in these areas.  A difference of  2-3 points is a clinically meaningful difference.  A difference of 2-3 points in the total score of the Quality of Life Index has been associated with significant improvement in overall quality of life, self-image, physical symptoms, and general health in studies assessing change in quality of life.  PHQ-9: Review Flowsheet       02/01/2024 08/03/2023  Depression screen PHQ 2/9  Decreased Interest 0 0  Down, Depressed, Hopeless 0 0  PHQ - 2 Score 0 0  Altered sleeping - 0  Tired, decreased energy - 3  Change in appetite - 0  Feeling bad or failure about yourself  - 0  Trouble concentrating - 1   Moving slowly or fidgety/restless - 0  Suicidal thoughts - 0  PHQ-9 Score - 4     Details       Data saved with a previous flowsheet row definition        Interpretation of Total Score  Total Score Depression Severity:  1-4 = Minimal depression, 5-9 = Mild depression, 10-14 = Moderate depression, 15-19 = Moderately severe depression, 20-27 = Severe depression   Psychosocial Evaluation and Intervention:   Psychosocial Re-Evaluation:  Psychosocial Re-Evaluation     Row Name 10/20/23 1555 11/01/23 1504           Psychosocial Re-Evaluation   Current issues with Current Stress Concerns;Current Sleep Concerns Current Stress Concerns;Current Sleep Concerns      Comments Zunairah is doing well in rehab. She has had stress and sleep issues due to having two childeren getting married this year and all the planning. She has one wedidng coming up in Auguest so she is stressing making sure everything is in order and her mind races at night some thinking about things that need to be done  for the wedding. Aislynn continues to do well in rehab. She had one of her daughters bridal showers this past weekend so she has been worn from that. She is still stressed with both daughters getting married this year. She  still says that her mind races at night thinking about things that need to be done so she does not sleep well.      Expected Outcomes Short; Find an outlet for stress when it comes to wedding planning, have a list do small list at a time to help decrease stress   Long: exercise to help with stress releif Short; Find an outlet for stress when it comes to wedding planning, have a list do small list at a time to help decrease stress   Long: exercise to help with stress releif      Interventions Encouraged to attend Cardiac Rehabilitation for the exercise Encouraged to attend Cardiac Rehabilitation for the exercise      Continue Psychosocial Services  Follow up required by staff Follow up required  by staff         Psychosocial Discharge (Final Psychosocial Re-Evaluation):  Psychosocial Re-Evaluation - 11/01/23 1504       Psychosocial Re-Evaluation   Current issues with Current Stress Concerns;Current Sleep Concerns    Comments Anaisa continues to do well in rehab. She had one of her daughters bridal showers this past weekend so she has been worn from that. She is still stressed with both daughters getting married this year. She  still says that her mind races at night thinking about things that need to be done so she does not sleep well.    Expected Outcomes Short; Find an outlet for stress when it comes to wedding planning, have a list do small list at a time to help decrease stress   Long: exercise to help with stress releif    Interventions Encouraged to attend Cardiac Rehabilitation for the exercise    Continue Psychosocial Services  Follow up required by staff          Vocational Rehabilitation: Provide vocational rehab assistance to qualifying candidates.   Vocational Rehab Evaluation & Intervention:   Education: Education Goals: Education classes will be provided on a weekly basis, covering required topics. Participant will state understanding/return demonstration of topics presented.  Learning Barriers/Preferences:   Education Topics: Hypertension, Hypertension Reduction -Define heart disease and high blood pressure. Discus how high blood pressure affects the body and ways to reduce high blood pressure. Flowsheet Row CARDIAC REHAB PHASE II EXERCISE from 11/10/2023 in [place] CARDIAC REHABILITATION  Date 11/03/23  Educator dj  Instruction Review Code 1- Verbalizes Understanding    Exercise and Your Heart -Discuss why it is important to exercise, the FITT principles of exercise, normal and abnormal responses to exercise, and how to exercise safely.   Angina -Discuss definition of angina, causes of angina, treatment of angina, and how to decrease risk of  having angina.   Cardiac Medications -Review what the following cardiac medications are used for, how they affect the body, and side effects that may occur when taking the medications.  Medications include Aspirin, Beta blockers, calcium  channel blockers, ACE Inhibitors, angiotensin receptor blockers, diuretics, digoxin, and antihyperlipidemics. Flowsheet Row CARDIAC REHAB PHASE II EXERCISE from 11/10/2023 in [place] CARDIAC REH[facility]ILITATION  Date 11/10/23  Educator DJ  Instruction Review Code 1- Verbalizes Understanding    Congestive Heart Failure -Discuss the definition of CHF, how to live with CHF, the signs and symptoms of CHF,  and how keep track of weight and sodium intake.   Heart Disease and Intimacy -Discus the effect sexual activity has on the heart, how changes occur during intimacy as we age, and safety during sexual activity. Flowsheet Row CARDIAC REHAB PHASE II EXERCISE from 11/10/2023 in [place] CARDIAC REHABILITATION  Date 10/13/23  Educator HB  Instruction Review Code 1- Verbalizes Understanding    Smoking Cessation / COPD -Discuss different methods to quit smoking, the health benefits of quitting smoking, and the definition of COPD.   Nutrition I: Fats -Discuss the types of cholesterol, what cholesterol does to the heart, and how cholesterol levels can be controlled. Flowsheet Row CARDIAC REHAB PHASE II EXERCISE from 11/10/2023 in [place] CARDIAC REHABILITATION  Date 10/20/23  Educator [facility]  Instruction Review Code 1- Verbalizes Understanding    Nutrition II: Labels -Discuss the different components of food labels and how to read food label   Heart Parts/Heart Disease and PAD -Discuss the anatomy of the heart, the pathway of blood circulation through the heart, and these are affected by heart disease.   Stress I: Signs and Symptoms -Discuss the causes of stress, how stress may lead to anxiety and depression, and ways to limit stress. Flowsheet Row  CARDIAC REHAB PHASE II EXERCISE from 11/10/2023 in [place] CARDIAC REHABILITATION  Date 10/27/23  Educator HB  Instruction Review Code 1- Verbalizes Understanding    Stress II: Relaxation -Discuss different types of relaxation techniques to limit stress.   Warning Signs of Stroke / TIA -Discuss definition of a stroke, what the signs and symptoms are of a stroke, and how to identify when someone is having stroke.   Knowledge Questionnaire Score:   Core Components/Risk Factors/Patient Goals at Admission:   Core Components/Risk Factors/Patient Goals Review:   Goals and Risk Factor Review     Row Name 10/20/23 1600 11/01/23 1511           Core Components/Risk Factors/Patient Goals Review   Personal Goals Review Weight Management/Obesity;Hypertension Weight Management/Obesity      Review Yania is doing well in rehab. We talked about her BP today. It has been lower in the past weeks and she has been feeling sluggish as well. She stated that her BP medication was increased by 1/2 and us  wodering if this is causing it to be lower. Informed her to contact her doctor about how she is feeling and giving her a printout of her BP to show the decrease is BP Briellah is doing well in rehab. She does not ckeck her BP daily. We talked last time about her BP being lower and her feeling sluggish; She did call her cardioloigist at decreased her metoperlol by 1/2 pill and has been feeling better. She is on Zepbound to help with weight loss.      Expected Outcomes Short: contact Card about BP  long: keep checking BP Short: take BP to keep track of numbers    long term: continue to exercise for weight managment and overall well being         Core Components/Risk Factors/Patient Goals at Discharge (Final Review):   Goals and Risk Factor Review - 11/01/23 1511       Core Components/Risk Factors/Patient Goals Review   Personal Goals Review Weight Management/Obesity    Review Maleiya is doing well  in rehab. She does not ckeck her BP daily. We talked last time about her BP being lower and her feeling sluggish; She did call her cardioloigist at decreased her  metoperlol by 1/2 pill and has been feeling better. She is on Zepbound to help with weight loss.    Expected Outcomes Short: take BP to keep track of numbers    long term: continue to exercise for weight managment and overall well being          ITP Comments:  ITP Comments     Row Name 09/28/23 1052 10/04/23 1458 10/26/23 0955 11/23/23 1258 12/21/23 1545   ITP Comments 30 day review completed. ITP sent to Dr. Dorn Ross, Medical Director of Cardiac Rehab. Continue with ITP unless changes are made by physician. Patient has been cleared to start the program but has not started yet. Plans to start 10/04/23. First full day of exercise!  Patient was oriented to gym and equipment including functions, settings, policies, and procedures.  Patient's individual exercise prescription and treatment plan were reviewed.  All starting workloads were established based on the results of the 6 minute walk test done at initial orientation visit.  The plan for exercise progression was also introduced and progression will be customized based on patient's performance and goals. 30 day review completed. ITP sent to Dr. Dorn Ross, Medical Director of Cardiac Rehab. Continue with ITP unless changes are made by physician.  New to program. 30 day review completed. ITP sent to Dr. Dorn Ross, Medical Director of Cardiac Rehab. Continue with ITP unless changes are made by physician. 30 day review completed. ITP sent to Dr. Dorn Ross, Medical Director of Cardiac Rehab. Continue with ITP unless changes are made by physician.  Currently out on medical hold with foot in boot for broken ankle    Row Name 01/18/24 1821 02/15/24 0930 03/13/24 1141       ITP Comments 30 day review completed. ITP sent to Dr. Dorn Ross, Medical Director of Cardiac  Rehab. Continue with ITP unless changes are made by physician.  Currently out on medical hold with foot in boot for broken ankle 30 day review completed. ITP sent to Dr. Dorn Ross, Medical Director of Cardiac Rehab. Continue with ITP unless changes are made by physician.  Currently out on medical hold with foot in boot for broken ankle 30 day review completed. ITP sent to Dr. Dorn Ross, Medical Director of Cardiac Rehab. Continue with ITP unless changes are made by physician.  Currently out on medical hold with foot in boot for broken ankle.  36 weeks ends 04/06/24        Comments: 30 day review

## 2024-03-20 ENCOUNTER — Encounter (HOSPITAL_COMMUNITY): Payer: Self-pay

## 2024-03-21 ENCOUNTER — Encounter (HOSPITAL_COMMUNITY): Payer: Self-pay

## 2024-03-21 NOTE — Progress Notes (Signed)
 Discharge Progress Report  Patient Details  Name: Holly Allison MRN: [id number] Date of Birth: Apr 27, 1965 Referring Provider:   Flowsheet Row CARDIAC REHAB PHASE II ORIENTATION from 08/03/2023 in [facility]AC [facility]  Referring Provider Mallipeddi, Vishnu     Number of Visits: 19  Reason for Discharge:  Early Exit:  Lack of attendance, has had foot issues.  Smoking History:  Social History   Tobacco Use  Smoking Status Never  Smokeless Tobacco Never    Diagnosis:  No diagnosis found.  ADL [facility]:   Initial Exercise Prescription:   Discharge Exercise Prescription (Final Exercise Prescription Changes):  Exercise Prescription Changes - 11/10/23 1500       Response to Exercise   Blood Pressure (Admit) 110/60    Blood Pressure (Exit) 98/60    Heart Rate (Admit) 60 bpm    Heart Rate (Exercise) 104 bpm    Heart Rate (Exit) 53 bpm    Rating of Perceived Exertion (Exercise) 12    Duration Continue with 30 min of aerobic exercise without signs/symptoms of physical distress.    Intensity THRR unchanged      Progression   Progression Continue to progress workloads to maintain intensity without signs/symptoms of physical distress.      Resistance Training   Training Prescription Yes    Weight 4    Reps 10-15      Treadmill   MPH 2.8    Grade 1.5    Minutes 15    METs 3.72      REL-XR   Level 3    Speed 63    Minutes 15    METs 3.8      Home Exercise Plan   Plans to continue exercise at Home (comment)    Frequency Add 3 additional days to program exercise sessions.          Functional Capacity:   Psychological, QOL, Others - Outcomes: PHQ 2/9:    02/01/2024    2:04 PM 08/03/2023    3:40 PM  Depression screen PHQ 2/9  Decreased Interest 0 0  Down, Depressed, Hopeless 0 0  PHQ - 2 Score 0 0  Altered sleeping  0  Tired, decreased energy  3  Change in appetite  0  Feeling bad or failure about yourself   0  Trouble concentrating  1   Moving slowly or fidgety/restless  0  Suicidal thoughts  0  PHQ-9 Score  4      Data saved with a previous flowsheet row definition    Quality of Life:   Personal Goals: Goals established at orientation with interventions provided to work toward goal.    Personal Goals Discharge:  Goals and Risk Factor Review     Row Name 10/20/23 1600 11/01/23 1511           Core Components/Risk Factors/Patient Goals Review   Personal Goals Review Weight Management/Obesity;Hypertension Weight Management/Obesity      Review Holly Allison is doing well in rehab. We talked about her BP today. It has been lower in the past weeks and she has been feeling sluggish as well. She stated that her BP medication was increased by 1/2 and us  wodering if this is causing it to be lower. Informed her to contact her doctor about how she is feeling and giving her a printout of her BP to show the decrease is BP Holly Allison is doing well in rehab. She does not ckeck her BP daily. We talked last time about her BP  being lower and her feeling sluggish; She did call her cardioloigist at decreased her metoperlol by 1/2 pill and has been feeling better. She is on Zepbound to help with weight loss.      Expected Outcomes Short: contact Card about BP  long: keep checking BP Short: take BP to keep track of numbers    long term: continue to exercise for weight managment and overall well being         Exercise Goals and Review:   Exercise Goals Re-Evaluation:  Exercise Goals Re-Evaluation     Row Name 10/04/23 1509 10/20/23 1552 11/01/23 1457 11/08/23 1515       Exercise Goal Re-Evaluation   Exercise Goals Review Knowledge and understanding of Target Heart Rate Range (THRR);Able to understand and use rate of perceived exertion (RPE) scale Increase Strength and Stamina;Increase Physical Activity;Understanding of Exercise Prescription Increase Physical Activity;Increase Strength and Stamina;Understanding of Exercise Prescription  Increase Physical Activity;Increase Strength and Stamina;Able to check pulse independently;Understanding of Exercise Prescription    Comments Reviewed RPE and dyspnea scale, THR and program prescription with pt today.  Pt voiced understanding and was given a copy of goals to take home. Holly Allison is doing well in rehab. She has noticed since starting that her endurance and increased some. She did have some burning when she statrted the program between her shoulder blades but it has eased up. She does have some burning if she increases her walking speed near the end of the 15 minutes but it does ease up. Holly Allison is doing well in rehab. She is on her 15th session and continues to notice increased endurance. She continues to have no symptoms when exericsing like she did at the oritentation walk test. She has not walked much outside of rehab due to the heat. Reviewed home exercise with pt today.  Pt plans to walk and use gym equipment at home for exercise.  Reviewed THR, pulse, RPE, sign and symptoms, pulse oximetery and when to call 911 or MD.  Also discussed weather considerations and indoor options.  Pt voiced understanding.    Expected Outcomes Short: Use RPE daily to regulate intensity.  Long: Follow program prescription in THR. Short: increase some on speed  Long: contine to exericse Short: start exercisng at home  long term : continue to exerice at home Short: Start to add in more exercise at home Long: Conitnue to exercise independently       Nutrition & Weight - Outcomes:    Nutrition:   Nutrition Discharge:   Education Questionnaire Score:   Goals reviewed with patient; copy given to patient.

## 2024-03-21 NOTE — Progress Notes (Signed)
 Cardiac Individual Treatment Plan  Patient Details  Name: Holly Allison MRN: [id number] Date of Birth: Nov 20, 1965 Referring Provider:   Flowsheet Row CARDIAC REHAB PHASE II ORIENTATION from 08/03/2023 in [facility] CARDIAC REHABILITATION  Referring Provider Mallipeddi, Vishnu    Initial Encounter Date:  Flowsheet Row CARDIAC REHAB PHASE II ORIENTATION from 08/03/2023 in [facility] CARD[facility]AC REHABILITATION  Date 08/03/23    Visit Diagnosis: No diagnosis found.  Patient's Home Medications on Admission:  Current Outpatient Medications:    aspirin EC 81 MG tablet, Take 81 mg by mouth daily. Swallow whole., Disp: , Rfl:    calcium  carbonate (OSCAL) 1500 (600 Ca) MG TABS tablet, Take 600 mg of elemental calcium  by mouth 2 (two) times daily with a meal., Disp: , Rfl:    cetirizine (ZYRTEC) 10 MG tablet, Take 10 mg by mouth daily., Disp: , Rfl:    cholecalciferol (VITAMIN D3) 25 MCG (1000 UNIT) tablet, Take 1,000 Units by mouth 2 (two) times daily., Disp: , Rfl:    Evolocumab  (REPATHA  SURECLICK) 140 MG/ML SOAJ, INJECT 140MG  INTO THE SKIN EVERY 14 DAYS, Disp: 6 mL, Rfl: 1   famotidine  (PEPCID ) 40 MG tablet, Take 40 mg by mouth daily., Disp: , Rfl:    letrozole  (FEMARA ) 2.5 MG tablet, Take 1 tablet (2.5 mg total) by mouth daily., Disp: 90 tablet, Rfl: 3   levothyroxine (SYNTHROID) 50 MCG tablet, Take 50 mcg by mouth daily., Disp: , Rfl:    metoprolol  succinate (TOPROL  XL) 25 MG 24 hr tablet, Take 0.5 tablets (12.5 mg total) by mouth daily., Disp: 45 tablet, Rfl: 1   ranolazine  (RANEXA ) 500 MG 12 hr tablet, Take 1 tablet (500 mg total) by mouth 2 (two) times daily., Disp: 60 tablet, Rfl: 5   triamcinolone  cream (KENALOG) 0.1 %, , Disp: , Rfl:    ZEPBOUND 2.5 MG/0.5ML Pen, Inject 2.5 mg into the skin once a week., Disp: , Rfl:   Past Medical History: Past Medical History:  Diagnosis Date   Angio-edema    Cancer (HCC)    Family history of adverse reaction to anesthesia    Mother    Heart  murmur    Since childhood   Pre-diabetes    Patient states she was told she is prediabetic at yearly physical two months ago. Patient states her A1 C was around 5.6   Urticaria     Tobacco Use: Social History   Tobacco Use  Smoking Status Never  Smokeless Tobacco Never    Labs: Review Flowsheet       Latest Ref Rng & Units 08/06/2020  Labs for ITP Cardiac and Pulmonary Rehab  Hemoglobin A1c 4.8 - 5.6 % 5.5      Exercise Target Goals: Exercise Program Goal: Individual exercise prescription set using results from initial 6 min walk test and THRR while considering  patients activity barriers and safety.   Exercise Prescription Goal: Initial exercise prescription builds to 30-45 minutes a day of aerobic activity, 2-3 days per week.  Home exercise guidelines will be given to patient during program as part of exercise prescription that the participant will acknowledge.   Education: Aerobic Exercise: - Group verbal and visual presentation on the components of exercise prescription. Introduces F.I.T.T principle from ACSM for exercise prescriptions.  Reviews F.I.T.T. principles of aerobic exercise including progression. Written material provided at class time.   Education: Resistance Exercise: - Group verbal and visual presentation on the components of exercise prescription. Introduces F.I.T.T principle from ACSM for exercise prescriptions  Reviews F.I.T.T. principles of resistance exercise including progression. Written material provided at class time.    Education: Exercise & Equipment Safety: - Individual verbal instruction and demonstration of equipment use and safety with use of the equipment.   Education: Exercise Physiology & General Exercise Guidelines: - Group verbal and written instruction with models to review the exercise physiology of the cardiovascular system and associated critical values. Provides general exercise guidelines with specific guidelines to those with  heart or lung disease. Written material provided at class time.   Education: Flexibility, Balance, Mind/Body Relaxation: - Group verbal and visual presentation with interactive activity on the components of exercise prescription. Introduces F.I.T.T principle from ACSM for exercise prescriptions. Reviews F.I.T.T. principles of flexibility and balance exercise training including progression. Also discusses the mind body connection.  Reviews various relaxation techniques to help reduce and manage stress (i.e. Deep breathing, progressive muscle relaxation, and visualization). Balance handout provided to take home. Written material provided at class time.   Activity Barriers & Risk Stratification:   6 Minute Walk:   Oxygen Initial Assessment:   Oxygen Re-Evaluation:   Oxygen Discharge (Final Oxygen Re-Evaluation):   Initial Exercise Prescription:   Perform Capillary Blood Glucose checks as needed.  Exercise Prescription Changes:   Exercise Prescription Changes     Row Name 10/25/23 1500 11/08/23 1500 11/10/23 1500         Response to Exercise   Blood Pressure (Admit) 118/70 -- 110/60     Blood Pressure (Exit) 120/70 -- 98/60     Heart Rate (Admit) 55 bpm -- 60 bpm     Heart Rate (Exercise) 93 bpm -- 104 bpm     Heart Rate (Exit) 70 bpm -- 53 bpm     Rating of Perceived Exertion (Exercise) 12 -- 12     Duration Continue with 30 min of aerobic exercise without signs/symptoms of physical distress. -- Continue with 30 min of aerobic exercise without signs/symptoms of physical distress.     Intensity THRR unchanged -- THRR unchanged       Progression   Progression Continue to progress workloads to maintain intensity without signs/symptoms of physical distress. -- Continue to progress workloads to maintain intensity without signs/symptoms of physical distress.       Resistance Training   Training Prescription Yes -- Yes     Weight 4 -- 4     Reps 10-15 -- 10-15        Treadmill   MPH 2.5 -- 2.8     Grade 0 -- 1.5     Minutes 15 -- 15     METs 2.91 -- 3.72       REL-XR   Level 2 -- 3     Speed 50 -- 63     Minutes 15 -- 15     METs 3.6 -- 3.8       Home Exercise Plan   Plans to continue exercise at -- Home (comment)  walking, gym equipment Home (comment)     Frequency -- Add 3 additional days to program exercise sessions. Add 3 additional days to program exercise sessions.     Initial Home Exercises Provided -- 11/08/23 --        Exercise Comments:   Exercise Comments     Row Name 10/04/23 1458           Exercise Comments First full day of exercise!  Patient was oriented to gym and equipment including functions, settings, policies, and procedures.  Patient's  individual exercise prescription and treatment plan were reviewed.  All starting workloads were established based on the results of the 6 minute walk test done at initial orientation visit.  The plan for exercise progression was also introduced and progression will be customized based on patient's performance and goals.          Exercise Goals and Review:   Exercise Goals Re-Evaluation :  Exercise Goals Re-Evaluation     Row Name 10/04/23 1509 10/20/23 1552 11/01/23 1457 11/08/23 1515       Exercise Goal Re-Evaluation   Exercise Goals Review Knowledge and understanding of Target Heart Rate Range (THRR);Able to understand and use rate of perceived exertion (RPE) scale Increase Strength and Stamina;Increase Physical Activity;Understanding of Exercise Prescription Increase Physical Activity;Increase Strength and Stamina;Understanding of Exercise Prescription Increase Physical Activity;Increase Strength and Stamina;Able to check pulse independently;Understanding of Exercise Prescription    Comments Reviewed RPE and dyspnea scale, THR and program prescription with pt today.  Pt voiced understanding and was given a copy of goals to take home. Adiel is doing well in rehab. She has noticed  since starting that her endurance and increased some. She did have some burning when she statrted the program between her shoulder blades but it has eased up. She does have some burning if she increases her walking speed near the end of the 15 minutes but it does ease up. Mahoganie is doing well in rehab. She is on her 15th session and continues to notice increased endurance. She continues to have no symptoms when exericsing like she did at the oritentation walk test. She has not walked much outside of rehab due to the heat. Reviewed home exercise with pt today.  Pt plans to walk and use gym equipment at home for exercise.  Reviewed THR, pulse, RPE, sign and symptoms, pulse oximetery and when to call 911 or MD.  Also discussed weather considerations and indoor options.  Pt voiced understanding.    Expected Outcomes Short: Use RPE daily to regulate intensity.  Long: Follow program prescription in THR. Short: increase some on speed  Long: contine to exericse Short: start exercisng at home  long term : continue to exerice at home Short: Start to add in more exercise at home Long: Conitnue to exercise independently       Discharge Exercise Prescription (Final Exercise Prescription Changes):  Exercise Prescription Changes - 11/10/23 1500       Response to Exercise   Blood Pressure (Admit) 110/60    Blood Pressure (Exit) 98/60    Heart Rate (Admit) 60 bpm    Heart Rate (Exercise) 104 bpm    Heart Rate (Exit) 53 bpm    Rating of Perceived Exertion (Exercise) 12    Duration Continue with 30 min of aerobic exercise without signs/symptoms of physical distress.    Intensity THRR unchanged      Progression   Progression Continue to progress workloads to maintain intensity without signs/symptoms of physical distress.      Resistance Training   Training Prescription Yes    Weight 4    Reps 10-15      Treadmill   MPH 2.8    Grade 1.5    Minutes 15    METs 3.72      REL-XR   Level 3    Speed 63     Minutes 15    METs 3.8      Home Exercise Plan   Plans to continue exercise at Home (comment)  Frequency Add 3 additional days to program exercise sessions.          Nutrition:  Target Goals: Understanding of nutrition guidelines, daily intake of sodium 1500mg , cholesterol 200mg , calories 30% from fat and 7% or less from saturated fats, daily to have 5 or more servings of fruits and vegetables.  Education: Nutrition 1 -Group instruction provided by verbal, written material, interactive activities, discussions, models, and posters to present general guidelines for heart healthy nutrition including macronutrients, label reading, and promoting whole foods over processed counterparts. Education serves as pensions consultant of discussion of heart healthy eating for all. Written material provided at class time.    Education: Nutrition 2 -Group instruction provided by verbal, written material, interactive activities, discussions, models, and posters to present general guidelines for heart healthy nutrition including sodium, cholesterol, and saturated fat. Providing guidance of habit forming to improve blood pressure, cholesterol, and body weight. Written material provided at class time.     Biometrics:    Nutrition Therapy Plan and Nutrition Goals:   Nutrition Assessments:  MEDIFICTS Score Key: >=70 Need to make dietary changes  40-70 Heart Healthy Diet <= 40 Therapeutic Level Cholesterol Diet  Flowsheet Row CARDIAC VIRTUAL BASED CARE from 07/06/2023 in [facility] CARDIAC REHABILITATION  Picture Your Plate Total Score on Admission 50   Picture Your Plate Scores: <59 Unhealthy dietary pattern with much room for improvement. 41-50 Dietary pattern unlikely to meet recommendations for good health and room for improvement. 51-60 More healthful dietary pattern, with some room for improvement.  >60 Healthy dietary pattern, although there may be some specific behaviors that could be  improved.    Nutrition Goals Re-Evaluation:  Nutrition Goals Re-Evaluation     Row Name 10/20/23 1557 11/01/23 1510           Goals   Nutrition Goal healthy eating --      Comment Chevon is doing well in rehab. She has just started Zepbound to help with weight. She stated that it has decreased her appitie and she does not want to eat very much. She is working with this and getting use to being on the shot. Veeda is doing well in rehab. She is still on Zepbound to help with weight. She stated that she has started to get her appitie back and eating more then when she first started the zepbound.      Expected Outcome Short: find smaller portions of healthy foods you like to keep up with proteins   long term: focus on eating healthy options Short: pick healthier meals with small portions to help with weight lost   long: continue with healthy eating         Nutrition Goals Discharge (Final Nutrition Goals Re-Evaluation):  Nutrition Goals Re-Evaluation - 11/01/23 1510       Goals   Comment Lilu is doing well in rehab. She is still on Zepbound to help with weight. She stated that she has started to get her appitie back and eating more then when she first started the zepbound.    Expected Outcome Short: pick healthier meals with small portions to help with weight lost   long: continue with healthy eating          Psychosocial: Target Goals: Acknowledge presence or absence of significant depression and/or stress, maximize coping skills, provide positive support system. Participant is able to verbalize types and ability to use techniques and skills needed for reducing stress and depression.   Education: Stress, Anxiety, and Depression -  Group verbal and visual presentation to define topics covered.  Reviews how body is impacted by stress, anxiety, and depression.  Also discusses healthy ways to reduce stress and to treat/manage anxiety and depression. Written material provided at class  time.   Education: Sleep Hygiene -Provides group verbal and written instruction about how sleep can affect your health.  Define sleep hygiene, discuss sleep cycles and impact of sleep habits. Review good sleep hygiene tips.   Initial Review & Psychosocial Screening:   Quality of Life Scores:   Scores of 19 and below usually indicate a poorer quality of life in these areas.  A difference of  2-3 points is a clinically meaningful difference.  A difference of 2-3 points in the total score of the Quality of Life Index has been associated with significant improvement in overall quality of life, self-image, physical symptoms, and general health in studies assessing change in quality of life.  PHQ-9: Review Flowsheet       02/01/2024 08/03/2023  Depression screen PHQ 2/9  Decreased Interest 0 0  Down, Depressed, Hopeless 0 0  PHQ - 2 Score 0 0  Altered sleeping - 0  Tired, decreased energy - 3  Change in appetite - 0  Feeling bad or failure about yourself  - 0  Trouble concentrating - 1  Moving slowly or fidgety/restless - 0  Suicidal thoughts - 0  PHQ-9 Score - 4     Details       Data saved with a previous flowsheet row definition        Interpretation of Total Score  Total Score Depression Severity:  1-4 = Minimal depression, 5-9 = Mild depression, 10-14 = Moderate depression, 15-19 = Moderately severe depression, 20-27 = Severe depression   Psychosocial Evaluation and Intervention:   Psychosocial Re-Evaluation:  Psychosocial Re-Evaluation     Row Name 10/20/23 1555 11/01/23 1504           Psychosocial Re-Evaluation   Current issues with Current Stress Concerns;Current Sleep Concerns Current Stress Concerns;Current Sleep Concerns      Comments Giannina is doing well in rehab. She has had stress and sleep issues due to having two childeren getting married this year and all the planning. She has one wedidng coming up in Auguest so she is stressing making sure  everything is in order and her mind races at night some thinking about things that need to be done for the wedding. Inita continues to do well in rehab. She had one of her daughters bridal showers this past weekend so she has been worn from that. She is still stressed with both daughters getting married this year. She  still says that her mind races at night thinking about things that need to be done so she does not sleep well.      Expected Outcomes Short; Find an outlet for stress when it comes to wedding planning, have a list do small list at a time to help decrease stress   Long: exercise to help with stress releif Short; Find an outlet for stress when it comes to wedding planning, have a list do small list at a time to help decrease stress   Long: exercise to help with stress releif      Interventions Encouraged to attend Cardiac Rehabilitation for the exercise Encouraged to attend Cardiac Rehabilitation for the exercise      Continue Psychosocial Services  Follow up required by staff Follow up required by staff  Psychosocial Discharge (Final Psychosocial Re-Evaluation):  Psychosocial Re-Evaluation - 11/01/23 1504       Psychosocial Re-Evaluation   Current issues with Current Stress Concerns;Current Sleep Concerns    Comments Morgyn continues to do well in rehab. She had one of her daughters bridal showers this past weekend so she has been worn from that. She is still stressed with both daughters getting married this year. She  still says that her mind races at night thinking about things that need to be done so she does not sleep well.    Expected Outcomes Short; Find an outlet for stress when it comes to wedding planning, have a list do small list at a time to help decrease stress   Long: exercise to help with stress releif    Interventions Encouraged to attend Cardiac Rehabilitation for the exercise    Continue Psychosocial Services  Follow up required by staff           Vocational Rehabilitation: Provide vocational rehab assistance to qualifying candidates.   Vocational Rehab Evaluation & Intervention:   Education: Education Goals: Education classes will be provided on a variety of topics geared toward better understanding of heart health and risk factor modification. Participant will state understanding/return demonstration of topics presented as noted by education test scores.  Learning Barriers/Preferences:   General Cardiac Education Topics:  AED/CPR: - Group verbal and written instruction with the use of models to demonstrate the basic use of the AED with the basic ABC's of resuscitation.   Test and Procedures: - Group verbal and visual presentation and models provide information about basic cardiac anatomy and function. Reviews the testing methods done to diagnose heart disease and the outcomes of the test results. Describes the treatment choices: Medical Management, Angioplasty, or Coronary Bypass Surgery for treating various heart conditions including Myocardial Infarction, Angina, Valve Disease, and Cardiac Arrhythmias. Written material provided at class time.   Medication Safety: - Group verbal and visual instruction to review commonly prescribed medications for heart and lung disease. Reviews the medication, class of the drug, and side effects. Includes the steps to properly store meds and maintain the prescription regimen. Written material provided at class time.   Intimacy: - Group verbal instruction through game format to discuss how heart and lung disease can affect sexual intimacy. Written material provided at class time.   Know Your Numbers and Heart Failure: - Group verbal and visual instruction to discuss disease risk factors for cardiac and pulmonary disease and treatment options.  Reviews associated critical values for Overweight/Obesity, Hypertension, Cholesterol, and Diabetes.  Discusses basics of heart failure:  signs/symptoms and treatments.  Introduces Heart Failure Zone chart for action plan for heart failure. Written material provided at class time.   Infection Prevention: - Provides verbal and written material to individual with discussion of infection control including proper hand washing and proper equipment cleaning during exercise session.   Falls Prevention: - Provides verbal and written material to individual with discussion of falls prevention and safety.   Other: -Provides group and verbal instruction on various topics (see comments)   Knowledge Questionnaire Score:   Core Components/Risk Factors/Patient Goals at Admission:   Education:Diabetes - Individual verbal and written instruction to review signs/symptoms of diabetes, desired ranges of glucose level fasting, after meals and with exercise. Acknowledge that pre and post exercise glucose checks will be done for 3 sessions at entry of program.   Core Components/Risk Factors/Patient Goals Review:   Goals and Risk Factor Review  Row Name 10/20/23 1600 11/01/23 1511           Core Components/Risk Factors/Patient Goals Review   Personal Goals Review Weight Management/Obesity;Hypertension Weight Management/Obesity      Review Meghen is doing well in rehab. We talked about her BP today. It has been lower in the past weeks and she has been feeling sluggish as well. She stated that her BP medication was increased by 1/2 and us  wodering if this is causing it to be lower. Informed her to contact her doctor about how she is feeling and giving her a printout of her BP to show the decrease is BP Haelyn is doing well in rehab. She does not ckeck her BP daily. We talked last time about her BP being lower and her feeling sluggish; She did call her cardioloigist at decreased her metoperlol by 1/2 pill and has been feeling better. She is on Zepbound to help with weight loss.      Expected Outcomes Short: contact Card about BP  long:  keep checking BP Short: take BP to keep track of numbers    long term: continue to exercise for weight managment and overall well being         Core Components/Risk Factors/Patient Goals at Discharge (Final Review):   Goals and Risk Factor Review - 11/01/23 1511       Core Components/Risk Factors/Patient Goals Review   Personal Goals Review Weight Management/Obesity    Review Evelisse is doing well in rehab. She does not ckeck her BP daily. We talked last time about her BP being lower and her feeling sluggish; She did call her cardioloigist at decreased her metoperlol by 1/2 pill and has been feeling better. She is on Zepbound to help with weight loss.    Expected Outcomes Short: take BP to keep track of numbers    long term: continue to exercise for weight managment and overall well being          ITP Comments:  ITP Comments     Row Name 09/28/23 1052 10/04/23 1458 10/26/23 0955 11/23/23 1258 12/21/23 1545   ITP Comments 30 day review completed. ITP sent to Dr. Dorn Ross, Medical Director of Cardiac Rehab. Continue with ITP unless changes are made by physician. Patient has been cleared to start the program but has not started yet. Plans to start 10/04/23. First full day of exercise!  Patient was oriented to gym and equipment including functions, settings, policies, and procedures.  Patient's individual exercise prescription and treatment plan were reviewed.  All starting workloads were established based on the results of the 6 minute walk test done at initial orientation visit.  The plan for exercise progression was also introduced and progression will be customized based on patient's performance and goals. 30 day review completed. ITP sent to Dr. Dorn Ross, Medical Director of Cardiac Rehab. Continue with ITP unless changes are made by physician.  New to program. 30 day review completed. ITP sent to Dr. Dorn Ross, Medical Director of Cardiac Rehab. Continue with ITP unless  changes are made by physician. 30 day review completed. ITP sent to Dr. Dorn Ross, Medical Director of Cardiac Rehab. Continue with ITP unless changes are made by physician.  Currently out on medical hold with foot in boot for broken ankle    Row Name 01/18/24 1821 02/15/24 0930 03/13/24 1141       ITP Comments 30 day review completed. ITP sent to Dr. Dorn Ross, Medical Director of Cardiac Rehab.  Continue with ITP unless changes are made by physician.  Currently out on medical hold with foot in boot for broken ankle 30 day review completed. ITP sent to Dr. Dorn Ross, Medical Director of Cardiac Rehab. Continue with ITP unless changes are made by physician.  Currently out on medical hold with foot in boot for broken ankle 30 day review completed. ITP sent to Dr. Dorn Ross, Medical Director of Cardiac Rehab. Continue with ITP unless changes are made by physician.  Currently out on medical hold with foot in boot for broken ankle.  36 weeks ends 04/06/24        Comments: Discharge ITP.

## 2024-04-25 ENCOUNTER — Other Ambulatory Visit: Payer: Self-pay | Admitting: Internal Medicine

## 2024-05-06 ENCOUNTER — Other Ambulatory Visit: Payer: Self-pay | Admitting: Internal Medicine

## 2024-05-28 ENCOUNTER — Ambulatory Visit: Admitting: Nurse Practitioner

## 2024-06-04 ENCOUNTER — Ambulatory Visit (HOSPITAL_COMMUNITY)

## 2025-01-30 ENCOUNTER — Inpatient Hospital Stay: Admitting: Physician Assistant
